# Patient Record
Sex: Female | Born: 1960 | Race: White | Hispanic: No | Marital: Married | Smoking: Former smoker
Health system: Southern US, Community
[De-identification: ages and names within clinical notes are randomized; demographics above are authoritative.]

## PROBLEM LIST (undated history)

## (undated) DIAGNOSIS — K5900 Constipation, unspecified: Secondary | ICD-10-CM

## (undated) DIAGNOSIS — K5790 Diverticulosis of intestine, part unspecified, without perforation or abscess without bleeding: Secondary | ICD-10-CM

## (undated) DIAGNOSIS — T7840XA Allergy, unspecified, initial encounter: Secondary | ICD-10-CM

## (undated) DIAGNOSIS — D3A029 Benign carcinoid tumor of the large intestine, unspecified portion: Secondary | ICD-10-CM

## (undated) DIAGNOSIS — J45909 Unspecified asthma, uncomplicated: Secondary | ICD-10-CM

## (undated) DIAGNOSIS — K5792 Diverticulitis of intestine, part unspecified, without perforation or abscess without bleeding: Secondary | ICD-10-CM

## (undated) DIAGNOSIS — Z5189 Encounter for other specified aftercare: Secondary | ICD-10-CM

## (undated) DIAGNOSIS — B019 Varicella without complication: Secondary | ICD-10-CM

## (undated) DIAGNOSIS — I10 Essential (primary) hypertension: Secondary | ICD-10-CM

## (undated) DIAGNOSIS — E079 Disorder of thyroid, unspecified: Secondary | ICD-10-CM

## (undated) HISTORY — DX: Constipation, unspecified: K59.00

## (undated) HISTORY — PX: ABDOMINAL HYSTERECTOMY: SHX81

## (undated) HISTORY — PX: LAPAROSCOPIC GASTRIC SLEEVE RESECTION: SHX5895

## (undated) HISTORY — DX: Diverticulitis of intestine, part unspecified, without perforation or abscess without bleeding: K57.92

## (undated) HISTORY — DX: Essential (primary) hypertension: I10

## (undated) HISTORY — PX: DG GALL BLADDER: HXRAD326

## (undated) HISTORY — DX: Varicella without complication: B01.9

## (undated) HISTORY — DX: Disorder of thyroid, unspecified: E07.9

## (undated) HISTORY — DX: Encounter for other specified aftercare: Z51.89

## (undated) HISTORY — PX: THYROIDECTOMY: SHX17

## (undated) HISTORY — DX: Unspecified asthma, uncomplicated: J45.909

## (undated) HISTORY — DX: Allergy, unspecified, initial encounter: T78.40XA

## (undated) HISTORY — DX: Diverticulosis of intestine, part unspecified, without perforation or abscess without bleeding: K57.90

## (undated) HISTORY — DX: Benign carcinoid tumor of the large intestine, unspecified portion: D3A.029

---

## 2017-12-07 ENCOUNTER — Ambulatory Visit: Payer: 59 | Admitting: Family Medicine

## 2017-12-07 ENCOUNTER — Encounter: Payer: Self-pay | Admitting: Family Medicine

## 2017-12-07 ENCOUNTER — Encounter (INDEPENDENT_AMBULATORY_CARE_PROVIDER_SITE_OTHER): Payer: Self-pay

## 2017-12-07 VITALS — BP 106/62 | HR 86 | Temp 98.4°F | Ht 65.0 in | Wt 163.4 lb

## 2017-12-07 DIAGNOSIS — E063 Autoimmune thyroiditis: Secondary | ICD-10-CM

## 2017-12-07 DIAGNOSIS — R591 Generalized enlarged lymph nodes: Secondary | ICD-10-CM

## 2017-12-07 DIAGNOSIS — E038 Other specified hypothyroidism: Secondary | ICD-10-CM | POA: Diagnosis not present

## 2017-12-07 MED ORDER — LIOTHYRONINE SODIUM 5 MCG PO TABS: 5.0000 ug | ORAL_TABLET | Freq: Every day | ORAL | 0 refills | Status: DC

## 2017-12-07 MED ORDER — LEVOTHYROXINE SODIUM 125 MCG PO TABS: 125.0000 ug | ORAL_TABLET | Freq: Every day | ORAL | 0 refills | Status: DC

## 2017-12-07 NOTE — Patient Instructions (Signed)
Good to see you today  Please follow up in 2-3 months for your complete physical- we'll order your mammogram at that time  I will notify you of lab results in the next couple of days

## 2017-12-07 NOTE — Progress Notes (Signed)
Subjective:    Patient ID: Debra Adkins, female    DOB: March 06, 1960, 57 y.o.   MRN: [id number]  HPI This is a 57 yo female, accompanied by her 92 yo grand daughter, who presents today to establish care. Married to [employer]. She is currently not working outside the home. Has 3 rescue dogs.   Last CPE- 2-3 years ago Mammo- awhile ago, requests to wait until January due to her high deductible Pap- hysterectomy 2015, unsure if cervix removed. Took ovaries.  Colonoscopy- sees GI in [place], has EGD every 1-2 hours, colonoscopy every 2 years Tdap- unsure Flu- today Eye- regular Dental- not for several years Exercise- walks  Has a lump on left side of back of head x 1 month, seems to come and go. Tender.   History of gastric carcinoid- sees GI in [place], has follow up next month. GI manages her B12 injections.   Requests refill of thyroid medications. Had thyroidectomy at age 63.   Past Medical History:  Diagnosis Date  . Allergy   . Blood transfusion without reported diagnosis   . Chicken pox   . Hypertension   . Thyroid disease    .   Review of Systems Per HPI    Objective:   Physical Exam  Constitutional: She is oriented to person, place, and time. She appears well-developed and well-nourished. No distress.  HENT:  Head: Normocephalic and atraumatic.    Right Ear: External ear normal.  Left Ear: External ear normal.  Nose: Nose normal.  Mouth/Throat: Oropharynx is clear and moist.  Neck: Normal range of motion. Neck supple.  Cardiovascular: Normal rate, regular rhythm and normal heart sounds.  Pulmonary/Chest: Effort normal and breath sounds normal.  Musculoskeletal: She exhibits no edema.  Lymphadenopathy:    She has no cervical adenopathy.  Neurological: She is alert and oriented to person, place, and time.  Skin: Skin is warm and dry. She is not diaphoretic.  Psychiatric: She has a normal mood and affect. Her behavior is normal. Judgment and thought content  normal.  Vitals reviewed.     BP 106/62   Pulse 86   Temp 98.4 F (36.9 C) (Oral)   Ht 5\' 5"  (1.651 m)   Wt 163 lb 6.4 oz (74.1 kg)   SpO2 98%   BMI 27.19 kg/m  Depression screen [facility] 2/9 12/07/2017  Decreased Interest 0  Down, Depressed, Hopeless 0  PHQ - 2 Score 0  Altered sleeping 1  Tired, decreased energy 1  Change in appetite 0  Feeling bad or failure about yourself  0  Trouble concentrating 0  Moving slowly or fidgety/restless 0  Suicidal thoughts 0  PHQ-9 Score 2  Difficult doing work/chores Not difficult at all       Assessment & Plan:  1. Hypothyroidism due to Hashimoto's thyroiditis - she is down to 1 tablet of her meds without refills, will send in 30 day supply and check TSH - TSH - liothyronine (CYTOMEL) 5 MCG tablet; Take 1 tablet (5 mcg total) by mouth daily.  Dispense: 30 tablet; Refill: 0 - levothyroxine (SYNTHROID, LEVOTHROID) 125 MCG tablet; Take 1 tablet (125 mcg total) by mouth daily before breakfast.  Dispense: 30 tablet; Refill: 0  2. Lymphadenopathy - likely reactive, will check CBC - CBC with Differential  - follow up in 2 months for cpe, will address overdue health maintenance at that time - she will have her GI send me records next month  Clarene Reamer, [facility]  [facility]  at [facility], [place]  12/07/2017 5:20 PM

## 2017-12-08 LAB — CBC WITH DIFFERENTIAL/PLATELET
BASOS PCT: 0.8 % (ref 0.0–3.0)
Basophils Absolute: 0.1 10*3/uL (ref 0.0–0.1)
EOS PCT: 4.6 % (ref 0.0–5.0)
Eosinophils Absolute: 0.4 10*3/uL (ref 0.0–0.7)
HCT: 46.2 % — ABNORMAL HIGH (ref 36.0–46.0)
Hemoglobin: 15.3 g/dL — ABNORMAL HIGH (ref 12.0–15.0)
LYMPHS ABS: 2.1 10*3/uL (ref 0.7–4.0)
Lymphocytes Relative: 25.7 % (ref 12.0–46.0)
MCHC: 33.2 g/dL (ref 30.0–36.0)
MCV: 91.1 fl (ref 78.0–100.0)
MONOS PCT: 4.8 % (ref 3.0–12.0)
Monocytes Absolute: 0.4 10*3/uL (ref 0.1–1.0)
Neutro Abs: 5.3 10*3/uL (ref 1.4–7.7)
Neutrophils Relative %: 64.1 % (ref 43.0–77.0)
Platelets: 226 10*3/uL (ref 150.0–400.0)
RBC: 5.07 Mil/uL (ref 3.87–5.11)
RDW: 12.1 % (ref 11.5–15.5)
WBC: 8.3 10*3/uL (ref 4.0–10.5)

## 2017-12-08 LAB — TSH: TSH: 0.12 u[IU]/mL — ABNORMAL LOW (ref 0.35–4.50)

## 2017-12-10 ENCOUNTER — Other Ambulatory Visit: Payer: Self-pay | Admitting: Family Medicine

## 2017-12-10 DIAGNOSIS — E063 Autoimmune thyroiditis: Principal | ICD-10-CM

## 2017-12-10 DIAGNOSIS — E038 Other specified hypothyroidism: Secondary | ICD-10-CM

## 2017-12-28 ENCOUNTER — Encounter: Payer: Self-pay | Admitting: Family Medicine

## 2018-01-07 ENCOUNTER — Other Ambulatory Visit: Payer: Self-pay | Admitting: Family Medicine

## 2018-01-07 DIAGNOSIS — E038 Other specified hypothyroidism: Secondary | ICD-10-CM

## 2018-01-07 DIAGNOSIS — E063 Autoimmune thyroiditis: Principal | ICD-10-CM

## 2018-01-07 NOTE — Telephone Encounter (Signed)
Okay to refill? 

## 2018-02-22 ENCOUNTER — Other Ambulatory Visit: Payer: Self-pay | Admitting: Family Medicine

## 2018-02-22 DIAGNOSIS — E063 Autoimmune thyroiditis: Principal | ICD-10-CM

## 2018-02-22 DIAGNOSIS — E038 Other specified hypothyroidism: Secondary | ICD-10-CM

## 2018-03-24 ENCOUNTER — Ambulatory Visit: Payer: 59 | Admitting: Family Medicine

## 2018-03-24 ENCOUNTER — Encounter: Payer: Self-pay | Admitting: Family Medicine

## 2018-03-24 VITALS — BP 110/68 | HR 76 | Temp 98.4°F | Ht 65.0 in | Wt 153.8 lb

## 2018-03-24 DIAGNOSIS — R062 Wheezing: Secondary | ICD-10-CM

## 2018-03-24 DIAGNOSIS — E063 Autoimmune thyroiditis: Secondary | ICD-10-CM | POA: Diagnosis not present

## 2018-03-24 DIAGNOSIS — E038 Other specified hypothyroidism: Secondary | ICD-10-CM

## 2018-03-24 DIAGNOSIS — Z9889 Other specified postprocedural states: Secondary | ICD-10-CM

## 2018-03-24 DIAGNOSIS — J22 Unspecified acute lower respiratory infection: Secondary | ICD-10-CM | POA: Diagnosis not present

## 2018-03-24 DIAGNOSIS — E538 Deficiency of other specified B group vitamins: Secondary | ICD-10-CM | POA: Diagnosis not present

## 2018-03-24 DIAGNOSIS — R059 Cough, unspecified: Secondary | ICD-10-CM

## 2018-03-24 DIAGNOSIS — R05 Cough: Secondary | ICD-10-CM

## 2018-03-24 LAB — VITAMIN B12: VITAMIN B 12: 252 pg/mL (ref 211–911)

## 2018-03-24 LAB — TSH: TSH: 1.3 u[IU]/mL (ref 0.35–4.50)

## 2018-03-24 MED ORDER — ALBUTEROL SULFATE HFA 108 (90 BASE) MCG/ACT IN AERS: 2.0000 | INHALATION_SPRAY | RESPIRATORY_TRACT | 0 refills | Status: DC | PRN

## 2018-03-24 MED ORDER — IPRATROPIUM-ALBUTEROL 0.5-2.5 (3) MG/3ML IN SOLN
3.0000 mL | Freq: Once | RESPIRATORY_TRACT | Status: AC
  Administered 2018-03-24: 3 mL via RESPIRATORY_TRACT

## 2018-03-24 MED ORDER — AMOXICILLIN 500 MG PO CAPS: 1000.0000 mg | ORAL_CAPSULE | Freq: Three times a day (TID) | ORAL | 0 refills | Status: AC

## 2018-03-24 MED ORDER — BENZONATATE 100 MG PO CAPS: 100.0000 mg | ORAL_CAPSULE | Freq: Three times a day (TID) | ORAL | 0 refills | Status: DC | PRN

## 2018-03-24 MED ORDER — PREDNISONE 10 MG PO TABS: ORAL_TABLET | ORAL | 0 refills | Status: DC

## 2018-03-24 NOTE — Progress Notes (Signed)
Subjective:    Patient ID: Debra Adkins, female    DOB: [birth year], 58 y.o.   MRN: [id number]  HPI This is 58 yo female, accompanied by her mother, who presents today with head and chest congestion x 3 weeks. Dry cough, yellow nasal drainage, no ear pain. Headache. Wheezing. Taking Mucinex. Subjective fever.    Past Medical History:  Diagnosis Date  . Allergy   . Asthma   . Blood transfusion without reported diagnosis   . Carcinoid tumor of colon    Followed by Dr. Karis Juba in [place]  . Chicken pox   . Constipation   . Diverticulosis   . Hypertension   . Thyroid disease    hoshimotos's thyroiditis at age 56   Past Surgical History:  Procedure Laterality Date  . ABDOMINAL HYSTERECTOMY    . DG GALL BLADDER    . LAPAROSCOPIC GASTRIC SLEEVE RESECTION    . THYROIDECTOMY     Family History  Problem Relation Age of Onset  . Heart disease Father   . Hyperlipidemia Father   . Cancer Brother   . Diabetes Brother   . Heart disease Brother   . Heart disease Brother    Social History   Tobacco Use  . Smoking status: Former Research scientist (life sciences)  . Smokeless tobacco: Never Used  Substance Use Topics  . Alcohol use: Yes    Comment: couple times a week  . Drug use: Never      Review of Systems Per HPI    Objective:   Physical Exam Constitutional:      General: She is not in acute distress.    Appearance: Normal appearance. She is normal weight. She is ill-appearing. She is not toxic-appearing or diaphoretic.  HENT:     Head: Normocephalic and atraumatic.     Right Ear: Tympanic membrane, ear canal and external ear normal.     Left Ear: Tympanic membrane, ear canal and external ear normal.     Nose: Nose normal.     Mouth/Throat:     Mouth: Mucous membranes are moist.     Pharynx: Oropharynx is clear.  Eyes:     Conjunctiva/sclera: Conjunctivae normal.  Neck:     Musculoskeletal: Normal range of motion and neck supple. No neck rigidity or muscular tenderness.    Cardiovascular:     Rate and Rhythm: Normal rate and regular rhythm.     Heart sounds: Normal heart sounds.  Pulmonary:     Effort: Pulmonary effort is normal.     Breath sounds: Wheezing and rhonchi present.     Comments: Scattered wheezes throughout, rhonchi left posterior.  Lymphadenopathy:     Cervical: No cervical adenopathy.  Neurological:     Mental Status: She is alert and oriented to person, place, and time.  Psychiatric:        Mood and Affect: Mood normal.        Behavior: Behavior normal.        Thought Content: Thought content normal.        Judgment: Judgment normal.       BP 110/68   Pulse 76   Temp 98.4 F (36.9 C) (Oral)   Ht 5\' 5"  (1.651 m)   Wt 153 lb 12 oz (69.7 kg)   SpO2 97%   BMI 25.59 kg/m  Wt Readings from Last 3 Encounters:  03/24/18 153 lb 12 oz (69.7 kg)  12/07/17 163 lb 6.4 oz (74.1 kg)   Given duoneb in office with subjective  improvement and improved airflow and decreased wheezing on auscultation.     Assessment & Plan:  1. Cough - ipratropium-albuterol (DUONEB) 0.5-2.5 (3) MG/3ML nebulizer solution 3 mL - benzonatate (TESSALON) 100 MG capsule; Take 1-2 capsules (100-200 mg total) by mouth 3 (three) times daily as needed for cough.  Dispense: 30 capsule; Refill: 0 - albuterol (PROVENTIL HFA;VENTOLIN HFA) 108 (90 Base) MCG/ACT inhaler; Inhale 2 puffs into the lungs every 4 (four) hours as needed for wheezing or shortness of breath.  Dispense: 1 Inhaler; Refill: 0 - predniSONE (DELTASONE) 10 MG tablet; Take 6 x 1 days, 5x1, 4x1 3x1, 2x1, 1x1  Dispense: 21 tablet; Refill: 0  2. Wheeze - ipratropium-albuterol (DUONEB) 0.5-2.5 (3) MG/3ML nebulizer solution 3 mL - albuterol (PROVENTIL HFA;VENTOLIN HFA) 108 (90 Base) MCG/ACT inhaler; Inhale 2 puffs into the lungs every 4 (four) hours as needed for wheezing or shortness of breath.  Dispense: 1 Inhaler; Refill: 0 - predniSONE (DELTASONE) 10 MG tablet; Take 6 x 1 days, 5x1, 4x1 3x1, 2x1, 1x1   Dispense: 21 tablet; Refill: 0  3. Hypothyroidism due to Hashimoto's thyroiditis - TSH - levothyroxine (SYNTHROID, LEVOTHROID) 100 MCG tablet; Take 1 tablet (100 mcg total) by mouth daily before breakfast.  Dispense: 90 tablet; Refill: 3  4. Lower respiratory infection - Provided written and verbal information regarding diagnosis and treatment. - RTC/ER precautions reviewed - amoxicillin (AMOXIL) 500 MG capsule; Take 2 capsules (1,000 mg total) by mouth 3 (three) times daily for 7 days.  Dispense: 42 capsule; Refill: 0 - predniSONE (DELTASONE) 10 MG tablet; Take 6 x 1 days, 5x1, 4x1 3x1, 2x1, 1x1  Dispense: 21 tablet; Refill: 0  5. B12 deficiency - Vitamin B12 - cyanocobalamin (,VITAMIN B-12,) 1000 MCG/ML injection; Inject 1 mL (1,000 mcg total) into the skin every 30 (thirty) days.  Dispense: 1 mL; Refill: 11  6. History of gastric surgery - cyanocobalamin (,VITAMIN B-12,) 1000 MCG/ML injection; Inject 1 mL (1,000 mcg total) into the skin every 30 (thirty) days.  Dispense: 1 mL; Refill: [place], FNP-BC  [facility] at [facility], [facility]  03/28/2018 6:36 PM l

## 2018-03-24 NOTE — Progress Notes (Signed)
Subjective:    Patient ID: Debra Adkins, female    DOB: 09-03-1960, 58 y.o.   MRN: [id number]  HPI This is a 58 yo female being seen today cough x3 weeks. Symptoms got better at first then worsened again. She has a non-productive cough with SOB and wheezes; runny nose with yellow drainage; headaches, denies ear pian, irregular heart beats; no energy, appetite and fluid intake is good.    Patient has taken 1000 mg guaifenesin "a few times" without symptom relief.   Past Medical History:  Diagnosis Date  . Allergy   . Asthma   . Blood transfusion without reported diagnosis   . Carcinoid tumor of colon    Followed by Dr. Karis Juba in [place]  . Chicken pox   . Constipation   . Diverticulosis   . Hypertension   . Thyroid disease    hoshimotos's thyroiditis at age 45   Past Surgical History:  Procedure Laterality Date  . ABDOMINAL HYSTERECTOMY    . DG GALL BLADDER    . LAPAROSCOPIC GASTRIC SLEEVE RESECTION    . THYROIDECTOMY     Family History  Problem Relation Age of Onset  . Heart disease Father   . Hyperlipidemia Father   . Cancer Brother   . Diabetes Brother   . Heart disease Brother   . Heart disease Brother    Social History   Tobacco Use  . Smoking status: Former Research scientist (life sciences)  . Smokeless tobacco: Never Used  Substance Use Topics  . Alcohol use: Yes    Comment: couple times a week  . Drug use: Never      Review of Systems Per HPI      Objective:   Physical Exam   BP 110/68   Pulse 76   Temp 98.4 F (36.9 C) (Oral)   Ht 5\' 5"  (1.651 m)   Wt 153 lb 12 oz (69.7 kg)   SpO2 97%   BMI 25.59 kg/m   BP Readings from Last 3 Encounters:  03/24/18 110/68  12/07/17 106/62    Wt Readings from Last 3 Encounters:  03/24/18 153 lb 12 oz (69.7 kg)  12/07/17 163 lb 6.4 oz (74.1 kg)           Assessment & Plan:  Cough - Plan: ipratropium-albuterol (DUONEB) 0.5-2.5 (3) MG/3ML nebulizer solution 3 mL, benzonatate (TESSALON) 100 MG capsule,  albuterol (PROVENTIL HFA;VENTOLIN HFA) 108 (90 Base) MCG/ACT inhaler  Wheeze - Plan: ipratropium-albuterol (DUONEB) 0.5-2.5 (3) MG/3ML nebulizer solution 3 mL, albuterol (PROVENTIL HFA;VENTOLIN HFA) 108 (90 Base) MCG/ACT inhaler  Hypothyroidism due to Hashimoto's thyroiditis - Plan: TSH  Lower respiratory infection - Plan: amoxicillin (AMOXIL) 500 MG capsule   Patient Instructions  I have sent an antibiotic, inhaler and cough medicine to your pharmacy Follow up if not significantly improved in 5-7 days Drink enough liquids to make your urine light yellow Tylenol or Advil for fever/pain   Community-Acquired Pneumonia, Adult Pneumonia is an infection of the lungs. It causes swelling in the airways of the lungs. Mucus and fluid may also build up inside the airways. One type of pneumonia can happen while a person is in a hospital. A different type can happen when a person is not in a hospital (community-acquired pneumonia).  What are the causes?  This condition is caused by germs (viruses, bacteria, or fungi). Some types of germs can be passed from one person to another. This can happen when you breathe in droplets from the cough or sneeze of  an infected person. What increases the risk? You are more likely to develop this condition if you:  Have a long-term (chronic) disease, such as: ? Chronic obstructive pulmonary disease (COPD). ? Asthma. ? Cystic fibrosis. ? Congestive heart failure. ? Diabetes. ? Kidney disease.  Have HIV.  Have sickle cell disease.  Have had your spleen removed.  Do not take good care of your teeth and mouth (poor dental hygiene).  Have a medical condition that increases the risk of breathing in droplets from your own mouth and nose.  Have a weakened body defense system (immune system).  Are a smoker.  Travel to areas where the germs that cause this illness are common.  Are around certain animals or the places they live. What are the signs or  symptoms?  A dry cough.  A wet (productive) cough.  Fever.  Sweating.  Chest pain. This often happens when breathing deeply or coughing.  Fast breathing or trouble breathing.  Shortness of breath.  Shaking chills.  Feeling tired (fatigue).  Muscle aches. How is this treated? Treatment for this condition depends on many things. Most adults can be treated at home. In some cases, treatment must happen in a hospital. Treatment may include:  Medicines given by mouth or through an IV tube.  Being given extra oxygen.  Respiratory therapy. In rare cases, treatment for very bad pneumonia may include:  Using a machine to help you breathe.  Having a procedure to remove fluid from around your lungs. Follow these instructions at home: Medicines  Take over-the-counter and prescription medicines only as told by your doctor. ? Only take cough medicine if you are losing sleep.  If you were prescribed an antibiotic medicine, take it as told by your doctor. Do not stop taking the antibiotic even if you start to feel better. General instructions   Sleep with your head and neck raised (elevated). You can do this by sleeping in a recliner or by putting a few pillows under your head.  Rest as needed. Get at least 8 hours of sleep each night.  Drink enough water to keep your pee (urine) pale yellow.  Eat a healthy diet that includes plenty of vegetables, fruits, whole grains, low-fat dairy products, and lean protein.  Do not use any products that contain nicotine or tobacco. These include cigarettes, e-cigarettes, and chewing tobacco. If you need help quitting, ask your doctor.  Keep all follow-up visits as told by your doctor. This is important. How is this prevented? A shot (vaccine) can help prevent pneumonia. Shots are often suggested for:  People older than 58 years of age.  People older than 57 years of age who: ? Are having cancer treatment. ? Have long-term (chronic)  lung disease. ? Have problems with their body's defense system. You may also prevent pneumonia if you take these actions:  Get the flu (influenza) shot every year.  Go to the dentist as often as told.  Wash your hands often. If you cannot use soap and water, use hand sanitizer. Contact a doctor if:  You have a fever.  You lose sleep because your cough medicine does not help. Get help right away if:  You are short of breath and it gets worse.  You have more chest pain.  Your sickness gets worse. This is very serious if: ? You are an older adult. ? Your body's defense system is weak.  You cough up blood. Summary  Pneumonia is an infection of the lungs.  Most  adults can be treated at home. Some will need treatment in a hospital.  Drink enough water to keep your pee pale yellow.  Get at least 8 hours of sleep each night. This information is not intended to replace advice given to you by your health care provider. Make sure you discuss any questions you have with your health care provider. Document Released: 06/25/2007 Document Revised: 09/03/2017 Document Reviewed: 09/03/2017 Elsevier Interactive Patient Education  2019 [employer].

## 2018-03-24 NOTE — Patient Instructions (Signed)
I have sent an antibiotic, inhaler and cough medicine to your pharmacy Follow up if not significantly improved in 5-7 days Drink enough liquids to make your urine light yellow Tylenol or Advil for fever/pain   Community-Acquired Pneumonia, Adult Pneumonia is an infection of the lungs. It causes swelling in the airways of the lungs. Mucus and fluid may also build up inside the airways. One type of pneumonia can happen while a person is in a hospital. A different type can happen when a person is not in a hospital (community-acquired pneumonia).  What are the causes?  This condition is caused by germs (viruses, bacteria, or fungi). Some types of germs can be passed from one person to another. This can happen when you breathe in droplets from the cough or sneeze of an infected person. What increases the risk? You are more likely to develop this condition if you:  Have a long-term (chronic) disease, such as: ? Chronic obstructive pulmonary disease (COPD). ? Asthma. ? Cystic fibrosis. ? Congestive heart failure. ? Diabetes. ? Kidney disease.  Have HIV.  Have sickle cell disease.  Have had your spleen removed.  Do not take good care of your teeth and mouth (poor dental hygiene).  Have a medical condition that increases the risk of breathing in droplets from your own mouth and nose.  Have a weakened body defense system (immune system).  Are a smoker.  Travel to areas where the germs that cause this illness are common.  Are around certain animals or the places they live. What are the signs or symptoms?  A dry cough.  A wet (productive) cough.  Fever.  Sweating.  Chest pain. This often happens when breathing deeply or coughing.  Fast breathing or trouble breathing.  Shortness of breath.  Shaking chills.  Feeling tired (fatigue).  Muscle aches. How is this treated? Treatment for this condition depends on many things. Most adults can be treated at home. In some  cases, treatment must happen in a hospital. Treatment may include:  Medicines given by mouth or through an IV tube.  Being given extra oxygen.  Respiratory therapy. In rare cases, treatment for very bad pneumonia may include:  Using a machine to help you breathe.  Having a procedure to remove fluid from around your lungs. Follow these instructions at home: Medicines  Take over-the-counter and prescription medicines only as told by your doctor. ? Only take cough medicine if you are losing sleep.  If you were prescribed an antibiotic medicine, take it as told by your doctor. Do not stop taking the antibiotic even if you start to feel better. General instructions   Sleep with your head and neck raised (elevated). You can do this by sleeping in a recliner or by putting a few pillows under your head.  Rest as needed. Get at least 8 hours of sleep each night.  Drink enough water to keep your pee (urine) pale yellow.  Eat a healthy diet that includes plenty of vegetables, fruits, whole grains, low-fat dairy products, and lean protein.  Do not use any products that contain nicotine or tobacco. These include cigarettes, e-cigarettes, and chewing tobacco. If you need help quitting, ask your doctor.  Keep all follow-up visits as told by your doctor. This is important. How is this prevented? A shot (vaccine) can help prevent pneumonia. Shots are often suggested for:  People older than 58 years of age.  People older than 58 years of age who: ? Are having cancer treatment. ?  Have long-term (chronic) lung disease. ? Have problems with their body's defense system. You may also prevent pneumonia if you take these actions:  Get the flu (influenza) shot every year.  Go to the dentist as often as told.  Wash your hands often. If you cannot use soap and water, use hand sanitizer. Contact a doctor if:  You have a fever.  You lose sleep because your cough medicine does not help. Get  help right away if:  You are short of breath and it gets worse.  You have more chest pain.  Your sickness gets worse. This is very serious if: ? You are an older adult. ? Your body's defense system is weak.  You cough up blood. Summary  Pneumonia is an infection of the lungs.  Most adults can be treated at home. Some will need treatment in a hospital.  Drink enough water to keep your pee pale yellow.  Get at least 8 hours of sleep each night. This information is not intended to replace advice given to you by your health care provider. Make sure you discuss any questions you have with your health care provider. Document Released: 06/25/2007 Document Revised: 09/03/2017 Document Reviewed: 09/03/2017 Elsevier Interactive Patient Education  2019 [employer].

## 2018-03-26 MED ORDER — CYANOCOBALAMIN 1000 MCG/ML IJ SOLN: 1000.0000 ug | INTRAMUSCULAR | 11 refills | Status: DC

## 2018-03-26 MED ORDER — LEVOTHYROXINE SODIUM 100 MCG PO TABS: 100.0000 ug | ORAL_TABLET | Freq: Every day | ORAL | 3 refills | Status: DC

## 2018-03-28 ENCOUNTER — Encounter: Payer: Self-pay | Admitting: Family Medicine

## 2018-03-29 ENCOUNTER — Encounter: Payer: Self-pay | Admitting: Family Medicine

## 2018-04-12 ENCOUNTER — Other Ambulatory Visit: Payer: Self-pay | Admitting: Family Medicine

## 2018-04-12 DIAGNOSIS — J4540 Moderate persistent asthma, uncomplicated: Secondary | ICD-10-CM

## 2018-04-12 MED ORDER — FLUTICASONE-SALMETEROL 100-50 MCG/DOSE IN AEPB: 1.0000 | INHALATION_SPRAY | Freq: Two times a day (BID) | RESPIRATORY_TRACT | 3 refills | Status: DC

## 2018-04-12 NOTE — Progress Notes (Signed)
Spoke with patient while on call with her mother. Patient reports some increased sneezing, nasal congestion, dry cough. Felt well immediately after finishing prednisone, feels like lungs feel full. No fever. She is using albuterol 3-4 times a day. Was previously on ICS years ago. Discussed adding daily antihistamine and daily maintenance inhaler. Patient agreeable. Prescription for advair sent to patient's pharmacy.

## 2018-04-16 ENCOUNTER — Other Ambulatory Visit: Payer: Self-pay

## 2018-04-16 DIAGNOSIS — J4540 Moderate persistent asthma, uncomplicated: Secondary | ICD-10-CM

## 2018-04-16 MED ORDER — FLUTICASONE-SALMETEROL 100-50 MCG/DOSE IN AEPB: 1.0000 | INHALATION_SPRAY | Freq: Two times a day (BID) | RESPIRATORY_TRACT | 3 refills | Status: DC

## 2018-05-12 ENCOUNTER — Ambulatory Visit (INDEPENDENT_AMBULATORY_CARE_PROVIDER_SITE_OTHER): Payer: 59 | Admitting: Family Medicine

## 2018-05-12 ENCOUNTER — Encounter: Payer: Self-pay | Admitting: Family Medicine

## 2018-05-12 VITALS — Temp 96.7°F | Ht 65.0 in | Wt 168.0 lb

## 2018-05-12 DIAGNOSIS — R05 Cough: Secondary | ICD-10-CM

## 2018-05-12 DIAGNOSIS — R059 Cough, unspecified: Secondary | ICD-10-CM

## 2018-05-12 DIAGNOSIS — R5383 Other fatigue: Secondary | ICD-10-CM | POA: Diagnosis not present

## 2018-05-12 DIAGNOSIS — R062 Wheezing: Secondary | ICD-10-CM | POA: Diagnosis not present

## 2018-05-12 NOTE — Progress Notes (Signed)
Virtual Visit via Video Note  I connected with Debra Adkins on 05/12/18 at  8:41 AM EDT by a video enabled telemedicine application and verified that I am speaking with the correct person using two identifiers.  Attempted to connect with patient via video, patient was having technical difficulties and was unable to connect so we did visit with audio only.  The patient was at her home and I was in my office.   I discussed the limitations of evaluation and management by telemedicine and the availability of in person appointments. The patient expressed understanding and agreed to proceed.  History of Present Illness: Debra Adkins is a 58 year old female who requests virtual visit today for cough and shortness of breath.  She was seen last month and diagnosed with lower respiratory infection.  She was treated with antibiotics and prednisone with improvement but she reports that she was not at 100%.  She was also started on Advair 100-50 twice daily for persistent wheezing.  She reports that she was not able to get this filled until April 15 and therefore just started it about a week ago.  I was unaware of this.  She has had some mild nausea after using the Advair.  Over the last several days she has had increased shortness of breath, wheeze and fatigue.  She has had temperature up to 100.  She has had a cough with a small amount of white sputum.  She is also been having postnasal drainage and runny nose, clear drainage.  No sneezing or watery eyes.  No one else at home is sick.  She is using her albuterol inhaler every 4-6 hours with temporary improvement of symptoms.  She is taking Claritin daily and Benadryl at bedtime.  She is sleeping okay.   Observations/Objective: The patient is alert and answers questions appropriately.  She is normally conversive.  She did have occasional cough while talking on the phone.  She did not sound short of breath nor did I hear any wheezing.  Assessment and Plan: 1.  Cough -Possible viral illness with low-grade fever.  Cannot exclude coronavirus.  Discussed this with her.  Advised her to isolate at home. -Continue Advair (suggested she use prior to eating), albuterol, Mucinex -Continue close follow-up with her via telephone  2. Wheeze - see #1  3. Other fatigue -When respiratory symptoms have improved, will check labs if fatigue is persistent   Clarene Reamer, FNP-BC  [facility] at [facility], [place]  05/12/2018 9:28 AM   Follow Up Instructions:    I discussed the assessment and treatment plan with the patient. The patient was provided an opportunity to ask questions and all were answered. The patient agreed with the plan and demonstrated an understanding of the instructions.   The patient was advised to call back or seek an in-person evaluation if the symptoms worsen or if the condition fails to improve as anticipated.  I provided 18 minutes of non-face-to-face time during this encounter.   Elby Beck, FNP

## 2018-05-14 ENCOUNTER — Other Ambulatory Visit: Payer: Self-pay | Admitting: Family Medicine

## 2018-05-14 ENCOUNTER — Telehealth: Payer: Self-pay | Admitting: Family Medicine

## 2018-05-14 DIAGNOSIS — R059 Cough, unspecified: Secondary | ICD-10-CM

## 2018-05-14 DIAGNOSIS — R05 Cough: Secondary | ICD-10-CM

## 2018-05-14 MED ORDER — BENZONATATE 100 MG PO CAPS: 100.0000 mg | ORAL_CAPSULE | Freq: Three times a day (TID) | ORAL | 0 refills | Status: DC | PRN

## 2018-05-14 NOTE — Telephone Encounter (Signed)
Called and spoke with patient to follow up on respiratory symptoms. She continues to run intermittent fever, highest 100.7 this am. Feels a little better. Out of tessalon which seemed to help, will send a prescription to her pharmacy. Continues Mucinex, albuterol inhaler. Able to talk in complete sentences. Will continue regular follow up while symptoms persist.

## 2018-05-17 ENCOUNTER — Encounter: Payer: Self-pay | Admitting: Family Medicine

## 2018-05-17 ENCOUNTER — Telehealth: Payer: Self-pay | Admitting: Family Medicine

## 2018-05-17 NOTE — Telephone Encounter (Signed)
Attempted to call patient again.  There was no answer and her voice mailbox was full.  MyChart message sent to her.

## 2018-05-17 NOTE — Telephone Encounter (Signed)
Called to check on patient. No answer. Unable to leave voice mail, box was full. Will try again later.

## 2018-05-19 NOTE — Telephone Encounter (Signed)
Patient replied to mychart message 05/18/18. Attempted to call her today to check on her. No answer, voice mailbox full. Will try again at later time.

## 2018-05-24 NOTE — Telephone Encounter (Signed)
Attempted to call patient to check on her symptoms. No answer. Mailbox full, unable to leave message.

## 2018-06-05 ENCOUNTER — Other Ambulatory Visit: Payer: Self-pay | Admitting: Family Medicine

## 2018-06-05 DIAGNOSIS — R062 Wheezing: Secondary | ICD-10-CM

## 2018-06-05 DIAGNOSIS — R059 Cough, unspecified: Secondary | ICD-10-CM

## 2018-06-05 DIAGNOSIS — R05 Cough: Secondary | ICD-10-CM

## 2018-07-14 ENCOUNTER — Ambulatory Visit: Payer: 59 | Admitting: Family Medicine

## 2018-07-14 ENCOUNTER — Other Ambulatory Visit: Payer: Self-pay | Admitting: Family Medicine

## 2018-07-14 ENCOUNTER — Other Ambulatory Visit: Payer: Self-pay

## 2018-07-14 ENCOUNTER — Encounter: Payer: Self-pay | Admitting: Family Medicine

## 2018-07-14 ENCOUNTER — Ambulatory Visit (INDEPENDENT_AMBULATORY_CARE_PROVIDER_SITE_OTHER)
Admission: RE | Admit: 2018-07-14 | Discharge: 2018-07-14 | Disposition: A | Discharge status: Home / Self Care | Payer: 59 | Source: Ambulatory Visit | Attending: Family Medicine | Admitting: Family Medicine

## 2018-07-14 VITALS — BP 124/86 | HR 76 | Temp 96.4°F | Wt 163.5 lb

## 2018-07-14 DIAGNOSIS — E039 Hypothyroidism, unspecified: Secondary | ICD-10-CM | POA: Diagnosis not present

## 2018-07-14 DIAGNOSIS — Z9889 Other specified postprocedural states: Secondary | ICD-10-CM

## 2018-07-14 DIAGNOSIS — E538 Deficiency of other specified B group vitamins: Secondary | ICD-10-CM

## 2018-07-14 DIAGNOSIS — R0602 Shortness of breath: Secondary | ICD-10-CM

## 2018-07-14 DIAGNOSIS — F419 Anxiety disorder, unspecified: Secondary | ICD-10-CM

## 2018-07-14 DIAGNOSIS — J4540 Moderate persistent asthma, uncomplicated: Secondary | ICD-10-CM | POA: Diagnosis not present

## 2018-07-14 LAB — VITAMIN D 25 HYDROXY (VIT D DEFICIENCY, FRACTURES): VITD: 23.67 ng/mL — ABNORMAL LOW (ref 30.00–100.00)

## 2018-07-14 LAB — TSH: TSH: 40.5 u[IU]/mL — ABNORMAL HIGH (ref 0.35–4.50)

## 2018-07-14 LAB — T4, FREE: Free T4: 0.56 ng/dL — ABNORMAL LOW (ref 0.60–1.60)

## 2018-07-14 MED ORDER — CYANOCOBALAMIN 1000 MCG/ML IJ SOLN: 1000.0000 ug | INTRAMUSCULAR | 3 refills | Status: DC

## 2018-07-14 MED ORDER — SERTRALINE HCL 50 MG PO TABS: 50.0000 mg | ORAL_TABLET | Freq: Every day | ORAL | 3 refills | Status: DC

## 2018-07-14 MED ORDER — QVAR REDIHALER 80 MCG/ACT IN AERB: 1.0000 | INHALATION_SPRAY | Freq: Two times a day (BID) | RESPIRATORY_TRACT | 5 refills | Status: DC

## 2018-07-14 MED ORDER — "SYRINGE/NEEDLE (DISP) 25G X 1"" 1 ML MISC": 1 refills | Status: DC

## 2018-07-14 NOTE — Progress Notes (Signed)
Subjective:    Patient ID: Debra Adkins, female    DOB: 1960-01-22, 58 y.o.   MRN: [id number]  HPI This is a 58 yo female, accompanied by her mother who also has an appointment, who presents today to discuss medications.   Athma- continues to feel "cloudy" in breathing, occasional wheeze, no cough. Comes and goes. Unknown triggers. Using albuterol inhaler 1-2 times a day. Used one canister of Advair 100-50 but did not think it helped. Albuterol helps temporarily. Previous course of prednisone with good relief.   Anxiety- has been feeling increasingly overwhelmed. Was home schooling her 66 yo grand daughter. Husband has been around more. Having a hard time relaxing.   Hypothyroid- was previously on liothyronine and levothyroxine. Stopped liothyronine, not sure why. Has been feeling more fatigued lately.   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 Last B12 03/24/2018 was 252 on monthly injections. Has follow up with GI in [place].   Past Medical History:  Diagnosis Date  . Allergy   . Asthma   . Blood transfusion without reported diagnosis   . Carcinoid tumor of colon    Followed by Dr. Karis Juba in [place]  . Chicken pox   . Constipation   . Diverticulosis   . Hypertension   . Thyroid disease    hoshimotos's thyroiditis at age 17   Past Surgical History:  Procedure Laterality Date  . ABDOMINAL HYSTERECTOMY    . DG GALL BLADDER    . LAPAROSCOPIC GASTRIC SLEEVE RESECTION    . THYROIDECTOMY     Family History  Problem Relation Age of Onset  . Heart disease Father   . Hyperlipidemia Father   . Cancer Brother   . Diabetes Brother   . Heart disease Brother   . Heart disease Brother    Social History   Tobacco Use  . Smoking status: Former Research scientist (life sciences)  . Smokeless tobacco: Never Used  Substance Use Topics  . Alcohol use: Yes    Comment: couple times a week  . Drug use: Never      Review of Systems Per HPI    Objective:   Physical Exam Vitals signs reviewed.  Constitutional:      General: She is not in acute distress.    Appearance: Normal appearance. She is normal weight. She is not ill-appearing, toxic-appearing or diaphoretic.  HENT:     Head: Normocephalic and atraumatic.  Eyes:     Conjunctiva/sclera: Conjunctivae normal.  Neck:     Musculoskeletal: Normal range of motion and neck supple.  Cardiovascular:     Rate and Rhythm: Normal rate and regular rhythm.     Heart sounds: Normal heart sounds.  Pulmonary:     Effort: Pulmonary effort is normal. No respiratory distress.     Breath sounds: Normal breath sounds. No stridor. No wheezing, rhonchi or rales.  Skin:    General: Skin is warm and dry.  Neurological:     Mental Status: She is alert and oriented to person, place, and time.  Psychiatric:        Mood and Affect: Mood normal.        Behavior: Behavior normal.        Thought Content: Thought content normal.        Judgment: Judgment normal.       BP 124/86 (BP Location: Left Arm, Patient Position: Sitting, Cuff Size: Normal)   Pulse 76  Temp (!) 96.4 F (35.8 C) (Tympanic)   Wt 163 lb 8 oz (74.2 kg)   SpO2 97%   BMI 27.21 kg/m  Wt Readings from Last 3 Encounters:  07/14/18 163 lb 8 oz (74.2 kg)  05/12/18 168 lb (76.2 kg)  03/24/18 153 lb 12 oz (69.7 kg)       Assessment & Plan:  1. B12 deficiency - cyanocobalamin (,VITAMIN B-12,) 1000 MCG/ML injection; Inject 1 mL (1,000 mcg total) into the skin every 7 (seven) days.  Dispense: 1 mL; Refill: 3  2. History of gastric surgery - Vitamin D, 25-hydroxy - cyanocobalamin (,VITAMIN B-12,) 1000 MCG/ML injection; Inject 1 mL (1,000 mcg total) into the skin every 7 (seven) days.  Dispense: 1 mL; Refill: 3  3. Acquired hypothyroidism - TSH - T4, Free - T3; Future  4. Moderate persistent asthma without complication - unable to do PFTs due to Covid 19 restrictions in office, will  check chest xray an discussed trying different ICS, patient agreeable - DG Chest 2 View; Future - beclomethasone (QVAR REDIHALER) 80 MCG/ACT inhaler; Inhale 1 puff into the lungs 2 (two) times daily.  Dispense: 10.6 g; Refill: 5  5. SOB (shortness of breath) - DG Chest 2 View; Future  6. Anxiety - encouraged her to continue walking, try meditation, yoga - sertraline (ZOLOFT) 50 MG tablet; Take 1 tablet (50 mg total) by mouth daily.  Dispense: 90 tablet; Refill: 3  - follow up in 3 months, sooner if worsening symptoms Clarene Reamer, FNP-BC  [facility] [place]  07/14/2018 12:41 PM

## 2018-07-14 NOTE — Addendum Note (Signed)
Addended by: Clarene Reamer B on: 07/14/2018 02:16 PM   Modules accepted: Orders

## 2018-07-14 NOTE — Patient Instructions (Signed)
Good to see you today  Hang in there. I sent in a prescription for sertraline to help level out your mood. Take 1/2 tablet at bedtime for 1 week then increase to 1 tablet. If any problems or if no improvement in 4 weeks, please get in touch.   I will notify you of lab/chest xray results  Follow up in 3 months, sooner if needed or if breathing worsens.

## 2018-07-14 NOTE — Telephone Encounter (Signed)
Called Yaw back and clarified the B12 Rx with him, also he requested [place] to send in the Rx for the syringe/needle so pt's insurance will pay for it too, done and Yaw aware

## 2018-07-14 NOTE — Telephone Encounter (Signed)
Best number [phone] hit 0 Ask for YAW @ [place] to verify quantity on rx from [facility]

## 2018-07-14 NOTE — Addendum Note (Signed)
Addended by: Helene Shoe on: 07/14/2018 04:05 PM   Modules accepted: Orders

## 2018-07-14 NOTE — Telephone Encounter (Addendum)
Debra Adkins from [facility] left v/m that pt wants 3 month supply of Vit B 12.Please advise.

## 2018-07-16 ENCOUNTER — Other Ambulatory Visit: Payer: Self-pay | Admitting: Family Medicine

## 2018-07-16 DIAGNOSIS — E038 Other specified hypothyroidism: Secondary | ICD-10-CM

## 2018-07-16 MED ORDER — LIOTHYRONINE SODIUM 5 MCG PO TABS: 5.0000 ug | ORAL_TABLET | Freq: Every day | ORAL | 3 refills | Status: DC

## 2018-07-16 NOTE — Addendum Note (Signed)
Addended by: Clarene Reamer B on: 07/16/2018 02:10 PM   Modules accepted: Orders

## 2018-11-12 ENCOUNTER — Other Ambulatory Visit: Payer: Self-pay | Admitting: Family Medicine

## 2018-11-12 DIAGNOSIS — R05 Cough: Secondary | ICD-10-CM

## 2018-11-12 DIAGNOSIS — R059 Cough, unspecified: Secondary | ICD-10-CM

## 2018-11-12 DIAGNOSIS — R062 Wheezing: Secondary | ICD-10-CM

## 2019-02-08 ENCOUNTER — Telehealth: Payer: Self-pay

## 2019-02-08 NOTE — Telephone Encounter (Signed)
Fax came from [facility] stating that they no longer carry [employer] brand and need an ok to change to [employer] brand. Thank you

## 2019-02-08 NOTE — Telephone Encounter (Signed)
Please call pharmacy and ok change of brand of levothyroxine.

## 2019-02-09 NOTE — Telephone Encounter (Signed)
Called pharmacy and gave verbal ok to switch brands

## 2019-03-17 ENCOUNTER — Telehealth: Payer: Self-pay

## 2019-03-17 ENCOUNTER — Telehealth: Payer: Self-pay | Admitting: Family Medicine

## 2019-03-17 ENCOUNTER — Encounter: Payer: Self-pay | Admitting: Family Medicine

## 2019-03-17 ENCOUNTER — Ambulatory Visit (INDEPENDENT_AMBULATORY_CARE_PROVIDER_SITE_OTHER): Payer: Managed Care, Other (non HMO) | Admitting: Family Medicine

## 2019-03-17 DIAGNOSIS — U071 COVID-19: Secondary | ICD-10-CM | POA: Insufficient documentation

## 2019-03-17 MED ORDER — PROMETHAZINE-DM 6.25-15 MG/5ML PO SYRP: 5.0000 mL | ORAL_SOLUTION | Freq: Four times a day (QID) | ORAL | 0 refills | Status: DC | PRN

## 2019-03-17 MED ORDER — AZITHROMYCIN 250 MG PO TABS: ORAL_TABLET | ORAL | 0 refills | Status: DC

## 2019-03-17 MED ORDER — PREDNISONE 10 MG PO TABS: ORAL_TABLET | ORAL | 0 refills | Status: DC

## 2019-03-17 NOTE — Progress Notes (Signed)
Virtual Visit via Video Note  I connected with Debra Adkins on 03/17/19 at 12:15 PM EST by a video enabled telemedicine application and verified that I am speaking with the correct person using two identifiers.  Location: Patient: home Provider:office   I discussed the limitations of evaluation and management by telemedicine and the availability of in person appointments. The patient expressed understanding and agreed to proceed.  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)   Mother died of covid just before xmas  Initially tested negative   She was diagnosed with covid 68 on 02/14/19  (she has been quarantined in [place])  Her husband re tested neg recently   She had body aches/ headache/ loss of smell an taste  Mild sob   Still struggling with symptoms overall  Yesterday started coughing - now green phlegm  Some wheezing- ventolin inhaler helps  Drinking water and taking mucinex  She is prone to acute bronchitis   She cannot sleep well due to cough and discomfort    Has had pneumonia before   Generally needs prednisone for wheezing  Has used tessalon in the past   Patient Active Problem List   Diagnosis Date Noted  . COVID-19 03/17/2019   Past Medical History:  Diagnosis Date  . Allergy   . Asthma   . Blood transfusion without reported diagnosis   . Carcinoid tumor of colon    Followed by Dr. Karis Juba in [place]  . Chicken pox   . Constipation   . Diverticulosis   . Hypertension   . Thyroid disease    hoshimotos's thyroiditis at age 28   Past Surgical History:  Procedure Laterality Date  . ABDOMINAL HYSTERECTOMY    . DG GALL BLADDER    . LAPAROSCOPIC GASTRIC SLEEVE RESECTION    . THYROIDECTOMY     Social History    Tobacco Use  . Smoking status: Former Research scientist (life sciences)  . Smokeless tobacco: Never Used  Substance Use Topics  . Alcohol use: Yes    Comment: couple times a week  . Drug use: Never   Family History  Problem Relation Age of Onset  . Heart disease Father   . Hyperlipidemia Father   . Cancer Brother   . Diabetes Brother   . Heart disease Brother   . Heart disease Brother    Allergies  Allergen Reactions  . Codeine Rash    nausea   Current Outpatient Medications on File Prior to Visit  Medication Sig Dispense Refill  . albuterol (VENTOLIN HFA) 108 (90 Base) MCG/ACT inhaler INHALE 2 PUFFS BY MOUTH EVERY 4 HOURS AS NEEDED FOR WHEEZING FOR SHORTNESS OF BREATH 18 g 2  . beclomethasone (QVAR REDIHALER) 80 MCG/ACT inhaler Inhale 1 puff into the lungs 2 (two) times daily. 10.6 g 5  . benzonatate (TESSALON) 100 MG capsule Take 1-2 capsules (100-200 mg total) by mouth 3 (three) times daily as needed. 30 capsule 0  . cyanocobalamin (,VITAMIN B-12,) 1000 MCG/ML injection Inject 1 mL (1,000 mcg total) into the skin every 7 (seven) days. 1 mL 3  . levothyroxine (SYNTHROID, LEVOTHROID) 100 MCG tablet Take 1 tablet (100 mcg total) by mouth daily before breakfast. 90 tablet 3  . liothyronine (CYTOMEL) 5 MCG  tablet Take 1 tablet (5 mcg total) by mouth daily. 90 tablet 3  . sertraline (ZOLOFT) 50 MG tablet Take 1 tablet (50 mg total) by mouth daily. 90 tablet 3  . Syringe/Needle, Disp, 25G X 1" 1 ML MISC Use one syringe to administer B12 injection 25 each 1  . VITAMIN D PO Take by mouth.     No current facility-administered medications on file prior to visit.   Review of Systems  Constitutional: Positive for malaise/fatigue. Negative for chills and fever.  HENT: Negative for congestion, ear pain, sinus pain and sore throat.   Eyes: Negative for blurred vision, discharge and redness.  Respiratory: Positive for cough, sputum production and wheezing. Negative for shortness of breath and stridor.    Cardiovascular: Negative for chest pain, palpitations and leg swelling.  Gastrointestinal: Negative for abdominal pain, diarrhea, nausea and vomiting.  Musculoskeletal: Negative for myalgias.  Skin: Negative for rash.  Neurological: Positive for headaches. Negative for dizziness.      Observations/Objective: Patient appears well, in no distress Weight is baseline  No facial swelling or asymmetry Normal voice-not hoarse and no slurred speech No obvious tremor or mobility impairment Moving neck and UEs normally Able to hear the call well  No sob or wheezing heard, a few coughs noted during the interview Talkative and mentally sharp with no cognitive changes No skin changes on face or neck , no rash or pallor Affect is normal    Assessment and Plan: Problem List Items Addressed This Visit      Other   COVID-19    Diagnosed 1/25 after close exposure  Symptoms got better and now worse (resp) Developing prod cough and wheeze (worrisome for secondary bronchitis or early pneumonia) in pt with h/o reactive airways  Prednisone taper and zpak sent  Also prometh-DM for cough  otc expectorant adv Fluids/rest/isolate ER if suddenly worse  Update if not starting to improve in a week or if worsening  -or if any new symptoms       Relevant Medications   azithromycin (ZITHROMAX Z-PAK) 250 MG tablet       Follow Up Instructions: Continue fluids and rest and isolation   mucinex (plain) to expectorate Phenergan-DM for cough  Prednisone for wheezing/tight breathing  zpack to cover bacteria just in case   Update if not starting to improve in a week or if worsening   Also if any new symptoms   It condition suddenly worsens go to the ER   I discussed the assessment and treatment plan with the patient. The patient was provided an opportunity to ask questions and all were answered. The patient agreed with the plan and demonstrated an understanding of the instructions.   The patient  was advised to call back or seek an in-person evaluation if the symptoms worsen or if the condition fails to improve as anticipated.  I provided 16 minutes of non-face-to-face time during this encounter.   Loura Pardon, MD

## 2019-03-17 NOTE — Telephone Encounter (Signed)
[name] - Client Nonclinical Telephone Record [facility]urse Client [place] Night - Client Client Site [place] Physician [name]- NP Contact Type Call Who Is Calling Patient / Member / Family / Caregiver Caller Name [place] Phone Number [phone] Patient Name Debra Adkins Patient DOB 01-Oct-1960 Call Type Message Only Information Provided Reason for Call Request for General Office Information Initial Comment Caller states that she would like to speak to [name]. Additional Comment Caller states that she would like to speak to her NP, [name], directly about her current symptoms. She preferred to send a message to the office to ask them to have her call her back directly. Confirmed office hours with caller as well. Disp. Time Disposition Final User 03/17/2019 7:46:02 AM General Information Provided Yes [name] Call Closed By: [name] Transaction Date/Time: 03/17/2019 7:43:25 AM (ET)

## 2019-03-17 NOTE — Telephone Encounter (Signed)
Saw note from telehealth visit with Dr. Glori Bickers today. Called patient to express condolences on the death of her mother who was also my patient. Reminded patient to notify office if no improvement with steroids, antibiotic. Go to ER/ call 911 if worsening symptoms.

## 2019-03-17 NOTE — Telephone Encounter (Signed)
Pt already has virtual appt with Dr Glori Bickers on 01/14/20 at 12:15pm.

## 2019-03-17 NOTE — Telephone Encounter (Signed)
I will see her then  

## 2019-03-17 NOTE — Assessment & Plan Note (Addendum)
Diagnosed 1/25 after close exposure  Symptoms got better and now worse (resp) Developing prod cough and wheeze (worrisome for secondary bronchitis or early pneumonia) in pt with h/o reactive airways  Prednisone taper and zpak sent  Also prometh-DM for cough  otc expectorant adv Fluids/rest/isolate ER if suddenly worse  Update if not starting to improve in a week or if worsening  -or if any new symptoms

## 2019-03-17 NOTE — Patient Instructions (Addendum)
Continue fluids and rest and isolation   mucinex (plain) to expectorate Phenergan-DM for cough  Prednisone for wheezing/tight breathing  zpack to cover bacteria just in case   Update if not starting to improve in a week or if worsening   Also if any new symptoms   It condition suddenly worsens go to the ER

## 2019-03-18 ENCOUNTER — Ambulatory Visit: Payer: 59 | Admitting: Family Medicine

## 2019-05-11 ENCOUNTER — Other Ambulatory Visit: Payer: Self-pay | Admitting: Family Medicine

## 2019-05-11 DIAGNOSIS — E038 Other specified hypothyroidism: Secondary | ICD-10-CM

## 2019-05-11 DIAGNOSIS — E063 Autoimmune thyroiditis: Secondary | ICD-10-CM

## 2019-05-11 NOTE — Telephone Encounter (Signed)
Rx was last refilled on 04/05/18 for #90 with 3 refills. Patient was last seen in the office on 06/24/18 and has no upcoming appts. Jackelyn Poling, is this ok to refill?

## 2019-06-21 ENCOUNTER — Other Ambulatory Visit: Payer: Self-pay | Admitting: Family Medicine

## 2019-06-21 DIAGNOSIS — E038 Other specified hypothyroidism: Secondary | ICD-10-CM

## 2019-06-21 DIAGNOSIS — E063 Autoimmune thyroiditis: Secondary | ICD-10-CM

## 2019-07-29 ENCOUNTER — Other Ambulatory Visit: Payer: Self-pay | Admitting: Family Medicine

## 2019-07-29 ENCOUNTER — Encounter: Payer: Self-pay | Admitting: Family Medicine

## 2019-07-29 DIAGNOSIS — E038 Other specified hypothyroidism: Secondary | ICD-10-CM

## 2019-07-29 MED ORDER — LEVOTHYROXINE SODIUM 100 MCG PO TABS: 100.0000 ug | ORAL_TABLET | Freq: Every day | ORAL | 0 refills | Status: DC

## 2019-10-06 ENCOUNTER — Other Ambulatory Visit: Payer: Self-pay | Admitting: Family Medicine

## 2019-10-06 DIAGNOSIS — E063 Autoimmune thyroiditis: Secondary | ICD-10-CM

## 2019-10-06 DIAGNOSIS — Z9889 Other specified postprocedural states: Secondary | ICD-10-CM

## 2019-10-06 DIAGNOSIS — E538 Deficiency of other specified B group vitamins: Secondary | ICD-10-CM

## 2019-10-06 DIAGNOSIS — E038 Other specified hypothyroidism: Secondary | ICD-10-CM

## 2019-10-07 ENCOUNTER — Other Ambulatory Visit: Payer: Self-pay

## 2019-10-07 ENCOUNTER — Other Ambulatory Visit (INDEPENDENT_AMBULATORY_CARE_PROVIDER_SITE_OTHER): Payer: Managed Care, Other (non HMO)

## 2019-10-07 DIAGNOSIS — Z9889 Other specified postprocedural states: Secondary | ICD-10-CM | POA: Diagnosis not present

## 2019-10-07 DIAGNOSIS — E063 Autoimmune thyroiditis: Secondary | ICD-10-CM | POA: Diagnosis not present

## 2019-10-07 DIAGNOSIS — E538 Deficiency of other specified B group vitamins: Secondary | ICD-10-CM

## 2019-10-07 DIAGNOSIS — E559 Vitamin D deficiency, unspecified: Secondary | ICD-10-CM | POA: Diagnosis not present

## 2019-10-07 DIAGNOSIS — E038 Other specified hypothyroidism: Secondary | ICD-10-CM | POA: Diagnosis not present

## 2019-10-07 LAB — COMPREHENSIVE METABOLIC PANEL
ALT: 18 U/L (ref 0–35)
AST: 13 U/L (ref 0–37)
Albumin: 4.3 g/dL (ref 3.5–5.2)
Alkaline Phosphatase: 59 U/L (ref 39–117)
BUN: 19 mg/dL (ref 6–23)
CO2: 28 mEq/L (ref 19–32)
Calcium: 9.2 mg/dL (ref 8.4–10.5)
Chloride: 105 mEq/L (ref 96–112)
Creatinine, Ser: 0.79 mg/dL (ref 0.40–1.20)
GFR: 74.5 mL/min (ref >60.00)
Glucose, Bld: 81 mg/dL (ref 70–99)
Potassium: 3.9 mEq/L (ref 3.5–5.1)
Sodium: 141 mEq/L (ref 135–145)
Total Bilirubin: 0.7 mg/dL (ref 0.2–1.2)
Total Protein: 6.6 g/dL (ref 6.0–8.3)

## 2019-10-07 LAB — CBC WITH DIFFERENTIAL/PLATELET
Basophils Absolute: 0.1 10*3/uL (ref 0.0–0.1)
Basophils Relative: 1 % (ref 0.0–3.0)
Eosinophils Absolute: 0.3 10*3/uL (ref 0.0–0.7)
Eosinophils Relative: 4 % (ref 0.0–5.0)
HCT: 42.6 % (ref 36.0–46.0)
Hemoglobin: 14.6 g/dL (ref 12.0–15.0)
Lymphocytes Relative: 33.3 % (ref 12.0–46.0)
Lymphs Abs: 2.4 10*3/uL (ref 0.7–4.0)
MCHC: 34.3 g/dL (ref 30.0–36.0)
MCV: 92.6 fl (ref 78.0–100.0)
Monocytes Absolute: 0.4 10*3/uL (ref 0.1–1.0)
Monocytes Relative: 5.6 % (ref 3.0–12.0)
Neutro Abs: 4.1 10*3/uL (ref 1.4–7.7)
Neutrophils Relative %: 56.1 % (ref 43.0–77.0)
Platelets: 207 10*3/uL (ref 150.0–400.0)
RBC: 4.6 Mil/uL (ref 3.87–5.11)
RDW: 13.3 % (ref 11.5–15.5)
WBC: 7.3 10*3/uL (ref 4.0–10.5)

## 2019-10-07 LAB — VITAMIN D 25 HYDROXY (VIT D DEFICIENCY, FRACTURES): VITD: 26.22 ng/mL — ABNORMAL LOW (ref 30.00–100.00)

## 2019-10-07 LAB — LIPID PANEL
Cholesterol: 176 mg/dL (ref 0–200)
HDL: 85.7 mg/dL (ref >39.00)
LDL Cholesterol: 79 mg/dL (ref 0–99)
NonHDL: 90.58
Total CHOL/HDL Ratio: 2
Triglycerides: 56 mg/dL (ref 0.0–149.0)
VLDL: 11.2 mg/dL (ref 0.0–40.0)

## 2019-10-07 LAB — VITAMIN B12: Vitamin B-12: 219 pg/mL (ref 211–911)

## 2019-10-07 LAB — T4, FREE: Free T4: 0.91 ng/dL (ref 0.60–1.60)

## 2019-10-07 LAB — TSH: TSH: 5.1 u[IU]/mL — ABNORMAL HIGH (ref 0.35–4.50)

## 2019-10-08 LAB — T3: T3, Total: 82 ng/dL (ref 76–181)

## 2019-10-17 ENCOUNTER — Other Ambulatory Visit: Payer: Self-pay

## 2019-10-17 ENCOUNTER — Encounter: Payer: Self-pay | Admitting: Family Medicine

## 2019-10-17 ENCOUNTER — Other Ambulatory Visit: Payer: Self-pay | Admitting: Family Medicine

## 2019-10-17 ENCOUNTER — Ambulatory Visit (INDEPENDENT_AMBULATORY_CARE_PROVIDER_SITE_OTHER): Payer: Managed Care, Other (non HMO) | Admitting: Family Medicine

## 2019-10-17 VITALS — BP 122/70 | HR 69 | Temp 97.9°F | Ht 60.5 in | Wt 166.5 lb

## 2019-10-17 DIAGNOSIS — Z9889 Other specified postprocedural states: Secondary | ICD-10-CM

## 2019-10-17 DIAGNOSIS — R1900 Intra-abdominal and pelvic swelling, mass and lump, unspecified site: Secondary | ICD-10-CM

## 2019-10-17 DIAGNOSIS — E038 Other specified hypothyroidism: Secondary | ICD-10-CM

## 2019-10-17 DIAGNOSIS — R1011 Right upper quadrant pain: Secondary | ICD-10-CM

## 2019-10-17 DIAGNOSIS — E538 Deficiency of other specified B group vitamins: Secondary | ICD-10-CM

## 2019-10-17 DIAGNOSIS — E063 Autoimmune thyroiditis: Secondary | ICD-10-CM

## 2019-10-17 DIAGNOSIS — J4540 Moderate persistent asthma, uncomplicated: Secondary | ICD-10-CM

## 2019-10-17 MED ORDER — CYANOCOBALAMIN 1000 MCG/ML IJ SOLN: 1000.0000 ug | INTRAMUSCULAR | 3 refills | Status: DC

## 2019-10-17 MED ORDER — LEVOTHYROXINE SODIUM 112 MCG PO TABS: 112.0000 ug | ORAL_TABLET | Freq: Every day | ORAL | 0 refills | Status: DC

## 2019-10-17 NOTE — Progress Notes (Signed)
Subjective:    Patient ID: Debra Adkins, female    DOB: [birth year], 59 y.o.   MRN: [id number]  HPI Chief Complaint  Patient presents with  . Follow-up    labs    Having an intermittent "knot" on upper right abdomen. Accompanied by severe pain, size of a "doorknob." Seems to come and go, she can't reproduce with movements. No change in bowel habits. Gall bladder removed years ago.   Increased reflux especially if she lies down will have painless regurgitation.   Asthma- using betamethasone one time a day, using albuterol a couple of times a day. For several months.   Sleeping ok, 6 hours. Increased fatigue. Not exercising regularly, able to do her normal activities.   Review of Systems Per HPI    Objective:   Physical Exam Vitals reviewed.  Constitutional:      General: She is not in acute distress.    Appearance: Normal appearance. She is obese. She is not ill-appearing, toxic-appearing or diaphoretic.  HENT:     Head: Normocephalic and atraumatic.  Cardiovascular:     Rate and Rhythm: Normal rate and regular rhythm.     Heart sounds: Normal heart sounds.  Pulmonary:     Effort: Pulmonary effort is normal.     Breath sounds: Normal breath sounds.  Abdominal:     General: Abdomen is flat. Bowel sounds are normal. There is no distension.     Palpations: Abdomen is soft. There is no mass.     Tenderness: There is no abdominal tenderness. There is no guarding or rebound.     Hernia: No hernia is present.     Comments: Patient examined sitting and supine. No visible or palpable mass.   Skin:    General: Skin is warm and dry.  Neurological:     Mental Status: She is alert and oriented to person, place, and time.  Psychiatric:        Mood and Affect: Mood normal.        Behavior: Behavior normal.        Thought Content: Thought content normal.        Judgment: Judgment normal.       BP 122/70   Pulse 69   Temp 97.9 F (36.6 C) (Temporal)   Ht 5' 0.5" (1.537 m)    Wt 166 lb 8 oz (75.5 kg)   SpO2 98%   BMI 31.98 kg/m  Wt Readings from Last 3 Encounters:  10/17/19 166 lb 8 oz (75.5 kg)  07/14/18 163 lb 8 oz (74.2 kg)  05/12/18 168 lb (76.2 kg)       Assessment & Plan:  1. Right upper quadrant abdominal pain - unclear if this is a hernia that is intermittently palpable. Discussed getting CT - CT Abdomen Pelvis W Contrast; Future  2. B12 deficiency - cyanocobalamin (,VITAMIN B-12,) 1000 MCG/ML injection; Inject 1 mL (1,000 mcg total) into the skin every 7 (seven) days.  Dispense: 12 mL; Refill: 3  3. History of gastric surgery - cyanocobalamin (,VITAMIN B-12,) 1000 MCG/ML injection; Inject 1 mL (1,000 mcg total) into the skin every 7 (seven) days.  Dispense: 12 mL; Refill: 3  4. Hypothyroidism due to Hashimoto's thyroiditis - levothyroxine (SYNTHROID) 112 MCG tablet; Take 1 tablet (112 mcg total) by mouth daily before breakfast.  Dispense: 90 tablet; Refill: 0  5. Abdominal wall bulge - unable to palpate in office today, per patient this is an intermittent symptom.  - CT Abdomen Pelvis W  Contrast; Future  6. Moderate persistent asthma without complication - discussed use of ICS inhaler and short acting. She was instructed to use her betamethasone inhaler bid, if no improvement in symptoms, follow up. May need dose increase or referral to pulmonary.   This visit occurred during the SARS-CoV-2 public health emergency.  Safety protocols were in place, including screening questions prior to the visit, additional usage of staff PPE, and extensive cleaning of exam room while observing appropriate contact time as indicated for disinfecting solutions.      Clarene Reamer, FNP-BC  [facility] at [facility]  10/20/2019 4:54 PM

## 2019-10-18 ENCOUNTER — Other Ambulatory Visit: Payer: Self-pay | Admitting: Family Medicine

## 2019-10-20 ENCOUNTER — Encounter: Payer: Self-pay | Admitting: Family Medicine

## 2019-10-20 DIAGNOSIS — Z9889 Other specified postprocedural states: Secondary | ICD-10-CM | POA: Insufficient documentation

## 2019-10-20 DIAGNOSIS — E063 Autoimmune thyroiditis: Secondary | ICD-10-CM | POA: Insufficient documentation

## 2019-10-20 DIAGNOSIS — E038 Other specified hypothyroidism: Secondary | ICD-10-CM | POA: Insufficient documentation

## 2019-10-20 DIAGNOSIS — J4540 Moderate persistent asthma, uncomplicated: Secondary | ICD-10-CM | POA: Insufficient documentation

## 2019-10-20 DIAGNOSIS — E538 Deficiency of other specified B group vitamins: Secondary | ICD-10-CM | POA: Insufficient documentation

## 2019-10-24 ENCOUNTER — Telehealth: Payer: Self-pay | Admitting: Family Medicine

## 2019-10-24 ENCOUNTER — Other Ambulatory Visit: Payer: Self-pay | Admitting: Family Medicine

## 2019-10-24 ENCOUNTER — Encounter: Payer: Self-pay | Admitting: Family Medicine

## 2019-10-24 DIAGNOSIS — J4540 Moderate persistent asthma, uncomplicated: Secondary | ICD-10-CM

## 2019-10-24 MED ORDER — PREDNISONE 10 MG PO TABS: ORAL_TABLET | ORAL | 0 refills | Status: DC

## 2019-10-24 NOTE — Telephone Encounter (Signed)
Pt says pharmacy did not have the prednisone rx you were going to call in, but they had her husband's rx.  Was hers called in at same time? Please advise. Thank you!

## 2019-10-24 NOTE — Telephone Encounter (Signed)
Called patient and left her a message. Prednisone was sent in 11:08 today, receipt confirmed by pharmacy.

## 2019-10-24 NOTE — Progress Notes (Signed)
Patient was with her husband who was being seen in office. Reports increased wheeze. Was seen by me recently. Increased her Qvar to BID. Had Covid earlier this year with increased SOB, significant improvement with prednisone. Will send in prednisone taper today. She is overdue follow up for her history of carcinoid tumor, has GI appointment next month. Reports that her breathing gets like this when she needs to have EGD with polyp removal.

## 2019-11-15 ENCOUNTER — Ambulatory Visit
Admission: RE | Admit: 2019-11-15 | Discharge: 2019-11-15 | Disposition: A | Discharge status: Home / Self Care | Payer: Managed Care, Other (non HMO) | Source: Ambulatory Visit | Attending: Family Medicine | Admitting: Family Medicine

## 2019-11-15 DIAGNOSIS — R1011 Right upper quadrant pain: Secondary | ICD-10-CM

## 2019-11-15 DIAGNOSIS — R1900 Intra-abdominal and pelvic swelling, mass and lump, unspecified site: Secondary | ICD-10-CM

## 2019-11-15 MED ORDER — IOPAMIDOL (ISOVUE-300) INJECTION 61%
100.0000 mL | Freq: Once | INTRAVENOUS | Status: AC | PRN
  Administered 2019-11-15: 100 mL via INTRAVENOUS

## 2019-11-16 ENCOUNTER — Encounter: Payer: Self-pay | Admitting: Family Medicine

## 2019-12-19 ENCOUNTER — Telehealth: Payer: Self-pay | Admitting: Family Medicine

## 2019-12-19 NOTE — Telephone Encounter (Signed)
Patient called in stating that insurance will not cover her vitamin b/d test. Pt stated she is needing this as it is medically necessary according to her previous physician. Asking if an authorization or claim can be re-filed for it.

## 2019-12-19 NOTE — Telephone Encounter (Signed)
Please call patient and ask her if she can provide a copy of the EOB and bill for me to review?

## 2019-12-20 NOTE — Telephone Encounter (Signed)
Called and spoke with patient. Patient stated that she will bring this in tomorrow morning, so that we can make a copy and provide this to [name], [place].

## 2019-12-21 NOTE — Telephone Encounter (Signed)
Copy of bill put on your desk.

## 2019-12-24 NOTE — Telephone Encounter (Signed)
Did you have a chance to look into this?

## 2019-12-26 NOTE — Telephone Encounter (Signed)
Noted.

## 2019-12-26 NOTE — Telephone Encounter (Signed)
Called billing and they have put it into review, patient notified. Will call billing in a couple of weeks to see if they were able to add and resubmit the new dx

## 2019-12-30 ENCOUNTER — Telehealth: Payer: Self-pay | Admitting: Family Medicine

## 2019-12-30 NOTE — Telephone Encounter (Signed)
Spoke to pt. Advised her that she would need to be seen to get an antibiotic. Advised her she would need to have a virtual visit due to sinus pressure/pain, nasal congestion. She will call Monday if she is not better.

## 2019-12-30 NOTE — Telephone Encounter (Signed)
She has a sinus infection and wanted to know about getting some type of medication or needing to know about what to do.

## 2020-02-03 ENCOUNTER — Telehealth: Payer: Self-pay

## 2020-02-03 DIAGNOSIS — E038 Other specified hypothyroidism: Secondary | ICD-10-CM

## 2020-02-03 DIAGNOSIS — E063 Autoimmune thyroiditis: Secondary | ICD-10-CM

## 2020-02-03 MED ORDER — LEVOTHYROXINE SODIUM 112 MCG PO TABS: 112.0000 ug | ORAL_TABLET | Freq: Every day | ORAL | 0 refills | Status: DC

## 2020-02-03 NOTE — Telephone Encounter (Signed)
Pharmacy requests refill on: Levothyroxine 112 mcg  LAST REFILL: 10/17/2019 (Q-90, R-0) LAST OV: 10/17/2019 NEXT OV: Not Scheduled  PHARMACY: [place], [place]  TSH (10/07/2019): 5.10

## 2020-03-15 ENCOUNTER — Other Ambulatory Visit: Payer: Self-pay

## 2020-03-15 DIAGNOSIS — R059 Cough, unspecified: Secondary | ICD-10-CM

## 2020-03-15 DIAGNOSIS — J4540 Moderate persistent asthma, uncomplicated: Secondary | ICD-10-CM

## 2020-03-15 DIAGNOSIS — R062 Wheezing: Secondary | ICD-10-CM

## 2020-03-15 NOTE — Telephone Encounter (Signed)
Patient came into office with concerns that they were not able to get there medications prior to their provider leaving the office. I offered TOC appts for both husband and wife and husband declined until he knows his sch. Wife has TOC for 04/12/20. Wife and husband are both needing medication renewals and are wanting a nurse to call and see what can be done for them until there appts    Please call and advise

## 2020-03-16 MED ORDER — QVAR REDIHALER 80 MCG/ACT IN AERB: 1.0000 | INHALATION_SPRAY | Freq: Two times a day (BID) | RESPIRATORY_TRACT | 5 refills | Status: DC

## 2020-03-16 MED ORDER — ALBUTEROL SULFATE HFA 108 (90 BASE) MCG/ACT IN AERS: INHALATION_SPRAY | RESPIRATORY_TRACT | 2 refills | Status: DC

## 2020-03-16 NOTE — Telephone Encounter (Signed)
Called and spoke with patient who stated that she needs a refill on the following medications:   Pharmacy requests refill on: Albuterol 108 mcg/act  LAST REFILL: 11/16/2018 (Q-18 g, R-2) LAST OV: 10/17/2019 NEXT OV: 04/12/2020 PHARMACY: [facility] [place], [place] requests refill on: Beclomethasone 80 mcg/act   LAST REFILL: 07/14/2018 (Q-10.6 g, R-5) LAST OV: 10/17/2019 NEXT OV: 04/12/2020 PHARMACY: [id number] [place], [place]   Patient would like to use [facility] [phone] in [place] for future use.

## 2020-03-26 ENCOUNTER — Telehealth: Payer: Self-pay

## 2020-03-26 NOTE — Telephone Encounter (Signed)
Pt called that her Qvar is more expensive than she would like and pt spoke with pharmacist at [place] and was advised that Flovent, Symbicort or Pulmicort might be more affordable with a PA. Pt thought a PA was already being done. Do not see PA info under chart review tab.Pt said she thinks she has enough Qvar to last until [facility] to DR [place] on 04/12/20 and if not pt will cb sooner. Pt plans to discuss with Dr Einar Pheasant at [facility] appt. Sending note to [facility] as FYI. See refill request on 03/15/20.

## 2020-03-26 NOTE — Telephone Encounter (Signed)
FYI

## 2020-03-26 NOTE — Telephone Encounter (Signed)
Noted can address at her appointment

## 2020-04-12 ENCOUNTER — Encounter: Payer: Managed Care, Other (non HMO) | Admitting: Family Medicine

## 2020-04-13 ENCOUNTER — Telehealth: Payer: Self-pay | Admitting: Family Medicine

## 2020-04-13 NOTE — Telephone Encounter (Signed)
error 

## 2020-04-26 ENCOUNTER — Other Ambulatory Visit: Payer: Self-pay | Admitting: Family Medicine

## 2020-04-26 DIAGNOSIS — E038 Other specified hypothyroidism: Secondary | ICD-10-CM

## 2020-05-02 ENCOUNTER — Ambulatory Visit: Payer: Managed Care, Other (non HMO) | Admitting: Family Medicine

## 2020-05-02 ENCOUNTER — Encounter: Payer: Self-pay | Admitting: Family Medicine

## 2020-05-02 ENCOUNTER — Encounter: Payer: Managed Care, Other (non HMO) | Admitting: Family Medicine

## 2020-05-02 ENCOUNTER — Other Ambulatory Visit: Payer: Self-pay

## 2020-05-02 VITALS — BP 126/82 | HR 75 | Temp 97.0°F | Ht 61.0 in | Wt 163.8 lb

## 2020-05-02 DIAGNOSIS — E063 Autoimmune thyroiditis: Secondary | ICD-10-CM

## 2020-05-02 DIAGNOSIS — K449 Diaphragmatic hernia without obstruction or gangrene: Secondary | ICD-10-CM | POA: Insufficient documentation

## 2020-05-02 DIAGNOSIS — Z85020 Personal history of malignant carcinoid tumor of stomach: Secondary | ICD-10-CM

## 2020-05-02 DIAGNOSIS — Z9889 Other specified postprocedural states: Secondary | ICD-10-CM

## 2020-05-02 DIAGNOSIS — E038 Other specified hypothyroidism: Secondary | ICD-10-CM | POA: Diagnosis not present

## 2020-05-02 DIAGNOSIS — J4540 Moderate persistent asthma, uncomplicated: Secondary | ICD-10-CM | POA: Diagnosis not present

## 2020-05-02 DIAGNOSIS — J4521 Mild intermittent asthma with (acute) exacerbation: Secondary | ICD-10-CM

## 2020-05-02 MED ORDER — PREDNISONE 50 MG PO TABS: 50.0000 mg | ORAL_TABLET | Freq: Every day | ORAL | 0 refills | Status: AC

## 2020-05-02 MED ORDER — FLUTICASONE PROPIONATE HFA 110 MCG/ACT IN AERO: 2.0000 | INHALATION_SPRAY | Freq: Two times a day (BID) | RESPIRATORY_TRACT | 12 refills | Status: DC

## 2020-05-02 MED ORDER — LEVOTHYROXINE SODIUM 125 MCG PO TABS: 125.0000 ug | ORAL_TABLET | Freq: Every day | ORAL | 0 refills | Status: DC

## 2020-05-02 MED ORDER — ALBUTEROL SULFATE HFA 108 (90 BASE) MCG/ACT IN AERS: 2.0000 | INHALATION_SPRAY | Freq: Four times a day (QID) | RESPIRATORY_TRACT | 0 refills | Status: DC | PRN

## 2020-05-02 NOTE — Assessment & Plan Note (Signed)
Lab Results  Component Value Date   TSH 5.10 (H) 10/07/2019   She notes feeling better on levothyroxine 125 mcg and would like to restart this. She would also like to stop liothyroine if possible. Will increase levo 112>125 mcg and stop liothyronine. She will return for labs in 6 weeks. If worsening symptoms off liothyronine or difficulty stopping may consider endocrine referral. Previous TSH >40 in setting of stopping liothyroine but that was on a lower dose of levothyroxine

## 2020-05-02 NOTE — Progress Notes (Signed)
Subjective:     Debra Adkins is a 60 y.o. female presenting for Transitions Of Care and Referral (Mammogram at [place] )     HPI  #Asthma - insurance issues - was on qvar - was suggested alternatives  - using albuterol every few hours - has not had qvar or been using - only uses qvar with flares - no specific trigger with flares   #Hypothyroidism - was seeing endocrine in [place] - had her dosage lowered and she feels this is not working    #hx of stomach cancer - recent EGD with her gastoenterologist due to worsening reflux - will get reflux when laying down - this seems to trigger asthma  Review of Systems   Social History   Tobacco Use  Smoking Status Former Smoker  . Packs/day: 0.10  . Types: Cigarettes  . Quit date: 01/20/2005  . Years since quitting: 15.2  Smokeless Tobacco Never Used        Objective:    BP Readings from Last 3 Encounters:  05/02/20 126/82  10/17/19 122/70  07/14/18 124/86   Wt Readings from Last 3 Encounters:  05/02/20 163 lb 12 oz (74.3 kg)  10/17/19 166 lb 8 oz (75.5 kg)  07/14/18 163 lb 8 oz (74.2 kg)    BP 126/82   Pulse 75   Temp (!) 97 F (36.1 C) (Temporal)   Ht 5\' 1"  (1.549 m)   Wt 163 lb 12 oz (74.3 kg)   SpO2 95%   BMI 30.94 kg/m    Physical Exam Constitutional:      General: She is not in acute distress.    Appearance: She is well-developed. She is not diaphoretic.  HENT:     Right Ear: External ear normal.     Left Ear: External ear normal.  Eyes:     Conjunctiva/sclera: Conjunctivae normal.  Cardiovascular:     Rate and Rhythm: Normal rate and regular rhythm.     Heart sounds: No murmur heard.   Pulmonary:     Effort: Pulmonary effort is normal.     Breath sounds: Decreased air movement present. Wheezing present.  Musculoskeletal:     Cervical back: Neck supple.  Skin:    General: Skin is warm and dry.     Capillary Refill: Capillary refill takes less than 2 seconds.  Neurological:      Mental Status: She is alert. Mental status is at baseline.  Psychiatric:        Mood and Affect: Mood normal.        Behavior: Behavior normal.           Assessment & Plan:   Problem List Items Addressed This Visit      Respiratory   Moderate persistent asthma without complication - Primary    Poorly controlled with exacerbation today. Suspect poor adherence to inhaled steroid contributing. Advised starting Flovent 110 mcg BID (insurance would not cover qvar) and using for the next few days. If no improvement in breathing start prednisone burst for improved lung. Cont prn albuterol. Advised regular use of Flovent to avoid flare ups.       Relevant Medications   albuterol (VENTOLIN HFA) 108 (90 Base) MCG/ACT inhaler   fluticasone (FLOVENT HFA) 110 MCG/ACT inhaler   predniSONE (DELTASONE) 50 MG tablet     Endocrine   Hypothyroidism due to Hashimoto's thyroiditis    Lab Results  Component Value Date   TSH 5.10 (H) 10/07/2019   She notes feeling better  on levothyroxine 125 mcg and would like to restart this. She would also like to stop liothyroine if possible. Will increase levo 112>125 mcg and stop liothyronine. She will return for labs in 6 weeks. If worsening symptoms off liothyronine or difficulty stopping may consider endocrine referral. Previous TSH >40 in setting of stopping liothyroine but that was on a lower dose of levothyroxine       Relevant Medications   levothyroxine (SYNTHROID) 125 MCG tablet   Other Relevant Orders   TSH   T4, free   T3, free     Other   History of gastric surgery   Hiatal hernia   History of malignant carcinoid tumor of stomach    S/p surgery. Follows with Dr. Trevor Mace. Had EGD recently due to worsening symptoms. Reflux triggers and worsens asthma symptoms. Appreciate GI support will request records. Cont hyoscyamine and promethazine prn       Other Visit Diagnoses    Mild intermittent asthma with exacerbation       Relevant  Medications   albuterol (VENTOLIN HFA) 108 (90 Base) MCG/ACT inhaler   fluticasone (FLOVENT HFA) 110 MCG/ACT inhaler   predniSONE (DELTASONE) 50 MG tablet       Return in about 6 weeks (around 06/13/2020) for for labs.  Lesleigh Noe, MD  This visit occurred during the SARS-CoV-2 public health emergency.  Safety protocols were in place, including screening questions prior to the visit, additional usage of staff PPE, and extensive cleaning of exam room while observing appropriate contact time as indicated for disinfecting solutions.

## 2020-05-02 NOTE — Assessment & Plan Note (Signed)
Poorly controlled with exacerbation today. Suspect poor adherence to inhaled steroid contributing. Advised starting Flovent 110 mcg BID (insurance would not cover qvar) and using for the next few days. If no improvement in breathing start prednisone burst for improved lung. Cont prn albuterol. Advised regular use of Flovent to avoid flare ups.

## 2020-05-02 NOTE — Patient Instructions (Signed)
Return for lab appointment in 6 weeks  Return to see me only if not doing well  Start Levothyroxine 125 mcg today  Stop the Levo 112 mcg and the Liothyronine

## 2020-05-02 NOTE — Assessment & Plan Note (Addendum)
S/p surgery. Follows with Dr. Trevor Mace. Had EGD recently due to worsening symptoms. Reflux triggers and worsens asthma symptoms. Appreciate GI support will request records. Cont hyoscyamine and promethazine prn

## 2020-05-03 ENCOUNTER — Telehealth: Payer: Self-pay

## 2020-05-03 NOTE — Telephone Encounter (Signed)
Received a PA from [facility] for: Albuterol sulfate HFA 108 MCG/ACT  Entered PA through cover my meds.   Waiting for response.

## 2020-05-08 ENCOUNTER — Encounter: Payer: Managed Care, Other (non HMO) | Admitting: Family Medicine

## 2020-07-29 ENCOUNTER — Other Ambulatory Visit: Payer: Self-pay | Admitting: Family Medicine

## 2020-07-29 DIAGNOSIS — E038 Other specified hypothyroidism: Secondary | ICD-10-CM

## 2020-07-30 NOTE — Telephone Encounter (Signed)
Called pt to schedule an appt. Pt did not answer and voicemail is full

## 2020-08-13 ENCOUNTER — Other Ambulatory Visit: Payer: Self-pay | Admitting: Family Medicine

## 2020-08-13 DIAGNOSIS — E038 Other specified hypothyroidism: Secondary | ICD-10-CM

## 2020-08-14 NOTE — Telephone Encounter (Signed)
Call patient no answer . Voice mail is full

## 2020-10-17 ENCOUNTER — Telehealth: Payer: Self-pay | Admitting: Family Medicine

## 2020-10-17 DIAGNOSIS — E063 Autoimmune thyroiditis: Secondary | ICD-10-CM

## 2020-10-17 DIAGNOSIS — E038 Other specified hypothyroidism: Secondary | ICD-10-CM

## 2020-10-19 NOTE — Telephone Encounter (Signed)
Pt said she don't have time to schedule apt. And pt was not impressed with dr. Einar Pheasant and she will try to find another provider at [facility]

## 2020-10-24 ENCOUNTER — Other Ambulatory Visit: Payer: Self-pay | Admitting: Family Medicine

## 2020-10-24 DIAGNOSIS — J4540 Moderate persistent asthma, uncomplicated: Secondary | ICD-10-CM

## 2020-10-25 ENCOUNTER — Ambulatory Visit: Payer: Managed Care, Other (non HMO) | Admitting: Nurse Practitioner

## 2020-10-25 ENCOUNTER — Other Ambulatory Visit: Payer: Self-pay

## 2020-10-25 ENCOUNTER — Encounter: Payer: Self-pay | Admitting: Nurse Practitioner

## 2020-10-25 VITALS — BP 130/88 | HR 99 | Temp 98.0°F | Resp 12 | Ht 61.0 in | Wt 166.5 lb

## 2020-10-25 DIAGNOSIS — E538 Deficiency of other specified B group vitamins: Secondary | ICD-10-CM | POA: Diagnosis not present

## 2020-10-25 DIAGNOSIS — E038 Other specified hypothyroidism: Secondary | ICD-10-CM

## 2020-10-25 DIAGNOSIS — Z85020 Personal history of malignant carcinoid tumor of stomach: Secondary | ICD-10-CM

## 2020-10-25 DIAGNOSIS — J4540 Moderate persistent asthma, uncomplicated: Secondary | ICD-10-CM

## 2020-10-25 DIAGNOSIS — E063 Autoimmune thyroiditis: Secondary | ICD-10-CM | POA: Diagnosis not present

## 2020-10-25 DIAGNOSIS — Z9889 Other specified postprocedural states: Secondary | ICD-10-CM | POA: Diagnosis not present

## 2020-10-25 MED ORDER — QVAR REDIHALER 80 MCG/ACT IN AERB: 1.0000 | INHALATION_SPRAY | Freq: Two times a day (BID) | RESPIRATORY_TRACT | 5 refills | Status: DC

## 2020-10-25 NOTE — Assessment & Plan Note (Signed)
Patient currently maintained on levothyroxine 125 mcg.  States she is on 2 different dosings in the past which seems to be levothyroxine and liothyronine.  We will continue levothyroxine 125 mcg pending labs today.  We will

## 2020-10-25 NOTE — Assessment & Plan Note (Signed)
Patient states Flovent not been effective.  States Qvar was better in the past but had difficulty getting with insurance coverage.  Looking today computer does state that Qvar is covered on her formulary we will switch to Qvar 80 mcg 1 puff twice daily.  Patient was instructed to stop Flovent.  She continues an albuterol inhaler as needed.  If she still has continual use of albuterol inhaler as described here today we will need to increase Qvar dosing or consider adjunct therapy.

## 2020-10-25 NOTE — Assessment & Plan Note (Signed)
History of same.  Has had gastric sleeve procedure.  Started by GI but now managed by primary care provider.  Last lab was approximately 1 year ago we will recheck labs today.  Patient gives 1000 mcg injection once weekly.  Continue pending lab results

## 2020-10-25 NOTE — Assessment & Plan Note (Signed)
[facility] followed [facility].  Continue following up with gastro group as recommended to continue medications prescribed by them as recommended.

## 2020-10-25 NOTE — Patient Instructions (Signed)
Nice  see you Stop Flovent and start using QVAR See you in 6 months

## 2020-10-25 NOTE — Progress Notes (Signed)
Established Patient Office Visit  Subjective:  Patient ID: Debra Adkins, female    DOB: 1961-01-14  Age: 60 y.o. MRN: [id number]  CC:  Chief Complaint  Patient presents with   Hypothyroidism    Follow up     HPI Debra Adkins presents for   Asthma: Flovent Albuterol 2-3 times daily. Waking up at night with wheezing. State she is unsure if it is her or the dogs. States that she was on QVAR and it worked better but had to change because of insurance.  Thyroid: Levothyroxine 125 mcg needs tsh updated. States that she use to be on 2 different doses that worked well. Had a thyroidectomy in the past  B12 def: 1039mcg injection weekly. Poor nutrient absorber. Last level checked approx 1 year ago.  Carcinoid tumor of stomach: Dr. Farrel Demark [facility].   Past Medical History:  Diagnosis Date   Allergy    Asthma    Blood transfusion without reported diagnosis    Carcinoid tumor of colon    Followed by Dr. Karis Juba in [place]   Chicken pox    Constipation    Diverticulosis    Hypertension    Thyroid disease    hoshimotos's thyroiditis at age 36    Past Surgical History:  Procedure Laterality Date   ABDOMINAL HYSTERECTOMY     DG GALL BLADDER     LAPAROSCOPIC GASTRIC SLEEVE RESECTION     THYROIDECTOMY      Family History  Problem Relation Age of Onset   Heart disease Father    Hyperlipidemia Father    Diabetes Brother    Heart disease Brother    Testicular cancer Brother    Heart disease Brother     Social History   Socioeconomic History   Marital status: Married    Spouse name: Denman George   Number of children: 0   Years of education: some college   Highest education level: Not on file  Occupational History   Not on file  Tobacco Use   Smoking status: Former    Packs/day: 0.25    Years: 20.00    Pack years: 5.00    Types: Cigarettes    Quit date: 01/20/2005    Years since quitting: 15.7   Smokeless tobacco: Never  Vaping Use   Vaping  Use: Never used  Substance and Sexual Activity   Alcohol use: Yes    Comment: 2-4 glasses of wine, 8-10 in a week   Drug use: Never   Sexual activity: Not Currently  Other Topics Concern   Not on file  Social History Narrative   05/02/20   From: [place], but moved when she was high school   Living: with Denman George, husband (2019) but together since 2005   Work: not currently due to health issues      Family: brother in [place]      Enjoys: rescue dogs - has 5      Exercise: not currently due to joint issues   Diet: low calorie, salads      Safety   Seat belts: Yes    Guns: Yes  and secure   Safe in relationships: Yes    Social Determinants of Health   Financial Resource Strain: Not on file  Food Insecurity: Not on file  Transportation Needs: Not on file  Physical Activity: Not on file  Stress: Not on file  Social Connections: Not on file  Intimate Partner Violence: Not on file    Outpatient  Medications Prior to Visit  Medication Sig Dispense Refill   albuterol (VENTOLIN HFA) 108 (90 Base) MCG/ACT inhaler INHALE 2 PUFFS INTO THE LUNGS EVERY SIX HOURS AS NEEDED FOR WHEEZING OR SHORTNESS OF BREATH. 18 each 1   cyanocobalamin (,VITAMIN B-12,) 1000 MCG/ML injection Inject 1 mL (1,000 mcg total) into the skin every 7 (seven) days. 12 mL 3   fluticasone (FLOVENT HFA) 110 MCG/ACT inhaler Inhale 2 puffs into the lungs in the morning and at bedtime. 1 each 12   hyoscyamine (LEVSIN SL) 0.125 MG SL tablet Place 0.125 mg under the tongue 4 (four) times daily as needed.     Pediatric Multiple Vitamins (FLINTSTONES MULTIVITAMIN PO) Take by mouth.     promethazine (PHENERGAN) 25 MG tablet Take 12.5-25 mg by mouth 3 (three) times daily as needed.     Syringe/Needle, Disp, 25G X 1" 1 ML MISC Use one syringe to administer B12 injection 25 each 1   VITAMIN D PO Take by mouth.     levothyroxine (SYNTHROID) 125 MCG tablet TAKE 1 TABLET BY MOUTH EVERY DAY (Patient not taking: Reported on  10/25/2020) 30 tablet 0   No facility-administered medications prior to visit.    Allergies  Allergen Reactions   Codeine Rash    nausea    ROS Review of Systems  Constitutional:  Positive for fatigue. Negative for chills and fever.  Respiratory:  Negative for cough and shortness of breath.   Cardiovascular:  Negative for chest pain.  Gastrointestinal:  Positive for abdominal pain (intermittently dealing with her hernia) and diarrhea. Negative for constipation, nausea and vomiting.  Neurological:  Negative for weakness and numbness.     Objective:    Physical Exam Vitals and nursing note reviewed.  Constitutional:      Appearance: Normal appearance.  HENT:     Right Ear: Tympanic membrane, ear canal and external ear normal. There is no impacted cerumen.     Left Ear: Tympanic membrane, ear canal and external ear normal. There is no impacted cerumen.     Mouth/Throat:     Mouth: Mucous membranes are moist.     Pharynx: Oropharynx is clear.  Eyes:     Extraocular Movements: Extraocular movements intact.     Pupils: Pupils are equal, round, and reactive to light.  Neck:     Comments: Scar from thyroidectomy Cardiovascular:     Rate and Rhythm: Normal rate and regular rhythm.  Pulmonary:     Effort: Pulmonary effort is normal.     Breath sounds: Normal breath sounds.  Abdominal:     General: Bowel sounds are normal. There is no distension.     Palpations: Abdomen is soft. There is no mass.     Tenderness: There is no abdominal tenderness.     Hernia: No hernia is present.  Musculoskeletal:     Right lower leg: No edema.     Left lower leg: No edema.  Lymphadenopathy:     Cervical: No cervical adenopathy.  Neurological:     General: No focal deficit present.     Mental Status: She is alert.     Motor: No weakness.     Coordination: Coordination normal.     Gait: Gait normal.     Deep Tendon Reflexes: Reflexes normal.  Psychiatric:        Mood and Affect: Mood  normal.        Behavior: Behavior normal.        Thought Content: Thought content normal.  Judgment: Judgment normal.    BP 130/88   Pulse 99   Temp 98 F (36.7 C)   Resp 12   Ht 5\' 1"  (1.549 m)   Wt 166 lb 8 oz (75.5 kg)   SpO2 97%   BMI 31.46 kg/m  Wt Readings from Last 3 Encounters:  10/25/20 166 lb 8 oz (75.5 kg)  05/02/20 163 lb 12 oz (74.3 kg)  10/17/19 166 lb 8 oz (75.5 kg)     Health Maintenance Due  Topic Date Due   HIV Screening  Never done   Hepatitis C Screening  Never done   TETANUS/TDAP  Never done   PAP SMEAR-Modifier  Never done   MAMMOGRAM  Never done   Zoster Vaccines- Shingrix (1 of 2) Never done    There are no preventive care reminders to display for this patient.  Lab Results  Component Value Date   TSH 5.10 (H) 10/07/2019   Lab Results  Component Value Date   WBC 7.3 10/07/2019   HGB 14.6 10/07/2019   HCT 42.6 10/07/2019   MCV 92.6 10/07/2019   PLT 207.0 10/07/2019   Lab Results  Component Value Date   NA 141 10/07/2019   K 3.9 10/07/2019   CO2 28 10/07/2019   GLUCOSE 81 10/07/2019   BUN 19 10/07/2019   CREATININE 0.79 10/07/2019   BILITOT 0.7 10/07/2019   ALKPHOS 59 10/07/2019   AST 13 10/07/2019   ALT 18 10/07/2019   PROT 6.6 10/07/2019   ALBUMIN 4.3 10/07/2019   CALCIUM 9.2 10/07/2019   GFR 74.50 10/07/2019   Lab Results  Component Value Date   CHOL 176 10/07/2019   Lab Results  Component Value Date   HDL 85.70 10/07/2019   Lab Results  Component Value Date   LDLCALC 79 10/07/2019   Lab Results  Component Value Date   TRIG 56.0 10/07/2019   Lab Results  Component Value Date   CHOLHDL 2 10/07/2019   No results found for: HGBA1C    Assessment & Plan:   Problem List Items Addressed This Visit       Respiratory   Moderate persistent asthma without complication    Patient states Flovent not been effective.  States Qvar was better in the past but had difficulty getting with insurance coverage.   Looking today computer does state that Qvar is covered on her formulary we will switch to Qvar 80 mcg 1 puff twice daily.  Patient was instructed to stop Flovent.  She continues an albuterol inhaler as needed.  If she still has continual use of albuterol inhaler as described here today we will need to increase Qvar dosing or consider adjunct therapy.      Relevant Medications   beclomethasone (QVAR REDIHALER) 80 MCG/ACT inhaler   Other Relevant Orders   CBC with Differential/Platelet     Endocrine   Hypothyroidism due to Hashimoto's thyroiditis    Patient currently maintained on levothyroxine 125 mcg.  States she is on 2 different dosings in the past which seems to be levothyroxine and liothyronine.  We will continue levothyroxine 125 mcg pending labs today.  We will      Relevant Orders   T4, free   TSH     Other   B12 deficiency - Primary    History of same.  Has had gastric sleeve procedure.  Started by GI but now managed by primary care provider.  Last lab was approximately 1 year ago we will recheck labs today.  Patient gives 1000 mcg injection once weekly.  Continue pending lab results      Relevant Orders   Vitamin B12   History of gastric surgery   Relevant Orders   Comprehensive metabolic panel   Folate   IBC + Ferritin   Vitamin B12   CBC with Differential/Platelet   History of malignant carcinoid tumor of stomach    [facility].  Continue following up with gastro group as recommended to continue medications prescribed by them as recommended.       No orders of the defined types were placed in this encounter.   Follow-up: Return in about 6 months (around 04/25/2021) for CPE and labs.   This visit occurred during the SARS-CoV-2 public health emergency.  Safety protocols were in place, including screening questions prior to the visit, additional usage of staff PPE, and extensive cleaning of exam room while observing  appropriate contact time as indicated for disinfecting solutions.   Romilda Garret, NP

## 2020-10-26 ENCOUNTER — Other Ambulatory Visit: Payer: Self-pay | Admitting: Nurse Practitioner

## 2020-10-26 DIAGNOSIS — E038 Other specified hypothyroidism: Secondary | ICD-10-CM

## 2020-10-26 LAB — COMPREHENSIVE METABOLIC PANEL
ALT: 30 U/L (ref 0–35)
AST: 20 U/L (ref 0–37)
Albumin: 4.7 g/dL (ref 3.5–5.2)
Alkaline Phosphatase: 69 U/L (ref 39–117)
BUN: 19 mg/dL (ref 6–23)
CO2: 29 mEq/L (ref 19–32)
Calcium: 9.8 mg/dL (ref 8.4–10.5)
Chloride: 103 mEq/L (ref 96–112)
Creatinine, Ser: 0.92 mg/dL (ref 0.40–1.20)
GFR: 67.92 mL/min (ref >60.00)
Glucose, Bld: 91 mg/dL (ref 70–99)
Potassium: 4.4 mEq/L (ref 3.5–5.1)
Sodium: 140 mEq/L (ref 135–145)
Total Bilirubin: 0.5 mg/dL (ref 0.2–1.2)
Total Protein: 7.5 g/dL (ref 6.0–8.3)

## 2020-10-26 LAB — CBC WITH DIFFERENTIAL/PLATELET
Basophils Absolute: 0.1 10*3/uL (ref 0.0–0.1)
Basophils Relative: 1 % (ref 0.0–3.0)
Eosinophils Absolute: 0.3 10*3/uL (ref 0.0–0.7)
Eosinophils Relative: 4.6 % (ref 0.0–5.0)
HCT: 45.5 % (ref 36.0–46.0)
Hemoglobin: 15.3 g/dL — ABNORMAL HIGH (ref 12.0–15.0)
Lymphocytes Relative: 24.8 % (ref 12.0–46.0)
Lymphs Abs: 1.8 10*3/uL (ref 0.7–4.0)
MCHC: 33.6 g/dL (ref 30.0–36.0)
MCV: 92.9 fl (ref 78.0–100.0)
Monocytes Absolute: 0.4 10*3/uL (ref 0.1–1.0)
Monocytes Relative: 5.5 % (ref 3.0–12.0)
Neutro Abs: 4.6 10*3/uL (ref 1.4–7.7)
Neutrophils Relative %: 64.1 % (ref 43.0–77.0)
Platelets: 226 10*3/uL (ref 150.0–400.0)
RBC: 4.9 Mil/uL (ref 3.87–5.11)
RDW: 13.1 % (ref 11.5–15.5)
WBC: 7.2 10*3/uL (ref 4.0–10.5)

## 2020-10-26 LAB — VITAMIN B12: Vitamin B-12: 419 pg/mL (ref 211–911)

## 2020-10-26 LAB — IBC + FERRITIN
Ferritin: 94.5 ng/mL (ref 10.0–291.0)
Iron: 90 ug/dL (ref 42–145)
Saturation Ratios: 22.1 % (ref 20.0–50.0)
TIBC: 407.4 ug/dL (ref 250.0–450.0)
Transferrin: 291 mg/dL (ref 212.0–360.0)

## 2020-10-26 LAB — T4, FREE: Free T4: 0.99 ng/dL (ref 0.60–1.60)

## 2020-10-26 LAB — FOLATE: Folate: 12.3 ng/mL (ref >5.9)

## 2020-10-26 LAB — TSH: TSH: 0.54 u[IU]/mL (ref 0.35–5.50)

## 2020-10-26 MED ORDER — LEVOTHYROXINE SODIUM 125 MCG PO TABS: 125.0000 ug | ORAL_TABLET | Freq: Every day | ORAL | 1 refills | Status: DC

## 2020-11-02 ENCOUNTER — Telehealth: Payer: Managed Care, Other (non HMO) | Admitting: Primary Care

## 2020-11-02 ENCOUNTER — Ambulatory Visit (HOSPITAL_COMMUNITY)
Admission: EM | Admit: 2020-11-02 | Discharge: 2020-11-02 | Disposition: A | Discharge status: Home / Self Care | Payer: Managed Care, Other (non HMO) | Attending: Medical Oncology | Admitting: Medical Oncology

## 2020-11-02 ENCOUNTER — Encounter (HOSPITAL_COMMUNITY): Payer: Self-pay | Admitting: Emergency Medicine

## 2020-11-02 ENCOUNTER — Ambulatory Visit (INDEPENDENT_AMBULATORY_CARE_PROVIDER_SITE_OTHER): Payer: Managed Care, Other (non HMO)

## 2020-11-02 ENCOUNTER — Other Ambulatory Visit: Payer: Self-pay

## 2020-11-02 DIAGNOSIS — J4540 Moderate persistent asthma, uncomplicated: Secondary | ICD-10-CM | POA: Diagnosis present

## 2020-11-02 DIAGNOSIS — R112 Nausea with vomiting, unspecified: Secondary | ICD-10-CM | POA: Diagnosis not present

## 2020-11-02 DIAGNOSIS — R197 Diarrhea, unspecified: Secondary | ICD-10-CM | POA: Diagnosis not present

## 2020-11-02 DIAGNOSIS — Z20822 Contact with and (suspected) exposure to covid-19: Secondary | ICD-10-CM | POA: Diagnosis not present

## 2020-11-02 DIAGNOSIS — R0989 Other specified symptoms and signs involving the circulatory and respiratory systems: Secondary | ICD-10-CM

## 2020-11-02 DIAGNOSIS — R059 Cough, unspecified: Secondary | ICD-10-CM

## 2020-11-02 DIAGNOSIS — R509 Fever, unspecified: Secondary | ICD-10-CM | POA: Diagnosis not present

## 2020-11-02 DIAGNOSIS — R051 Acute cough: Secondary | ICD-10-CM | POA: Diagnosis not present

## 2020-11-02 LAB — POCT URINALYSIS DIPSTICK, ED / UC
Bilirubin Urine: NEGATIVE
Glucose, UA: NEGATIVE mg/dL
Ketones, ur: NEGATIVE mg/dL
Leukocytes,Ua: NEGATIVE
Nitrite: NEGATIVE
Protein, ur: NEGATIVE mg/dL
Specific Gravity, Urine: >=1.03 (ref 1.005–1.030)
Urobilinogen, UA: 0.2 mg/dL (ref 0.0–1.0)
pH: 5.5 (ref 5.0–8.0)

## 2020-11-02 LAB — POC INFLUENZA A AND B ANTIGEN (URGENT CARE ONLY)
INFLUENZA A ANTIGEN, POC: NEGATIVE
INFLUENZA B ANTIGEN, POC: NEGATIVE

## 2020-11-02 MED ORDER — FLUTICASONE PROPIONATE 50 MCG/ACT NA SUSP: 2.0000 | Freq: Every day | NASAL | 0 refills | Status: DC

## 2020-11-02 MED ORDER — ALBUTEROL SULFATE HFA 108 (90 BASE) MCG/ACT IN AERS: 2.0000 | INHALATION_SPRAY | Freq: Four times a day (QID) | RESPIRATORY_TRACT | 1 refills | Status: DC | PRN

## 2020-11-02 MED ORDER — BENZONATATE 100 MG PO CAPS: 100.0000 mg | ORAL_CAPSULE | Freq: Three times a day (TID) | ORAL | 0 refills | Status: DC

## 2020-11-02 MED ORDER — KETOROLAC TROMETHAMINE 30 MG/ML IJ SOLN
INTRAMUSCULAR | Status: AC
  Filled 2020-11-02: qty 1

## 2020-11-02 MED ORDER — ONDANSETRON 4 MG PO TBDP: 4.0000 mg | ORAL_TABLET | Freq: Three times a day (TID) | ORAL | 0 refills | Status: DC | PRN

## 2020-11-02 MED ORDER — KETOROLAC TROMETHAMINE 30 MG/ML IJ SOLN
30.0000 mg | Freq: Once | INTRAMUSCULAR | Status: AC
  Administered 2020-11-02: 30 mg via INTRAMUSCULAR

## 2020-11-02 MED ORDER — ONDANSETRON 4 MG PO TBDP
4.0000 mg | ORAL_TABLET | Freq: Once | ORAL | Status: AC
  Administered 2020-11-02: 4 mg via ORAL

## 2020-11-02 MED ORDER — ONDANSETRON 4 MG PO TBDP
ORAL_TABLET | ORAL | Status: AC
  Filled 2020-11-02: qty 1

## 2020-11-02 NOTE — ED Triage Notes (Signed)
Monday had fatigue. Tuesday had nausea and diarrhea. Reports symptoms are getting worse and has headache. Reports woke up up with large whelps on base of anterior neck that was red and painful. Having back pains as well.

## 2020-11-02 NOTE — ED Provider Notes (Signed)
[place]    CSN: [id number] Arrival date & time: 11/02/20  1348      History   Chief Complaint Chief Complaint  Patient presents with   Nausea   Diarrhea    HPI Debra Adkins is a 60 y.o. female.   HPI  GI symptoms: Pt reports that starting on Monday she started having fatigue. The next day she developed non-bloody nausea and diarrhea. Also has had cough, slight increased urination, body aches, low garde fevers. Symptoms have gotten a bit worse and she now has a headache.  She also noticed that this morning she woke up and had a rash on the front of her neck that was red and painful.  She is having some sporadic back pains as well. She has tried aleve for symptoms without resolution of headache.  She denies any visual changes, photosensitivity or head injury.  Past Medical History:  Diagnosis Date   Allergy    Asthma    Blood transfusion without reported diagnosis    Carcinoid tumor of colon    Followed by Dr. Karis Juba in [place] pox    Constipation    Diverticulosis    Hypertension    Thyroid disease    hoshimotos's thyroiditis at age 61    Patient Active Problem List   Diagnosis Date Noted   Hiatal hernia 05/02/2020   History of malignant carcinoid tumor of stomach 05/02/2020   B12 deficiency 10/20/2019   History of gastric surgery 10/20/2019   Hypothyroidism due to Hashimoto's thyroiditis 10/20/2019   Moderate persistent asthma without complication 73/22/0254   COVID-19 03/17/2019    Past Surgical History:  Procedure Laterality Date   ABDOMINAL HYSTERECTOMY     states took everything but cervix?   DG GALL BLADDER     LAPAROSCOPIC GASTRIC SLEEVE RESECTION     THYROIDECTOMY      OB History   No obstetric history on file.      Home Medications    Prior to Admission medications   Medication Sig Start Date End Date Taking? Authorizing Provider  albuterol (VENTOLIN HFA) 108 (90 Base) MCG/ACT inhaler INHALE 2 PUFFS INTO  THE LUNGS EVERY SIX HOURS AS NEEDED FOR WHEEZING OR SHORTNESS OF BREATH. 10/24/20   Lesleigh Noe, MD  beclomethasone (QVAR REDIHALER) 80 MCG/ACT inhaler Inhale 1 puff into the lungs 2 (two) times daily. 10/25/20   Michela Pitcher, NP  cyanocobalamin (,VITAMIN B-12,) 1000 MCG/ML injection Inject 1 mL (1,000 mcg total) into the skin every 7 (seven) days. 10/17/19   Elby Beck, FNP  hyoscyamine (LEVSIN SL) 0.125 MG SL tablet Place 0.125 mg under the tongue 4 (four) times daily as needed. 04/17/20   [provider]  levothyroxine (SYNTHROID) 125 MCG tablet Take 1 tablet (125 mcg total) by mouth daily. 10/26/20   Michela Pitcher, NP  Pediatric Multiple Vitamins (FLINTSTONES MULTIVITAMIN PO) Take by mouth.    [provider]  promethazine (PHENERGAN) 25 MG tablet Take 12.5-25 mg by mouth 3 (three) times daily as needed. 04/16/20   [provider]  Syringe/Needle, Disp, 25G X 1" 1 ML MISC Use one syringe to administer B12 injection 07/14/18   Elby Beck, FNP  VITAMIN D PO Take by mouth.    [provider]    Family History Family History  Problem Relation Age of Onset   Heart disease Father    Hyperlipidemia Father    Other Father  Heart transplant   Diabetes Brother    Heart disease Brother    Testicular cancer Brother    Heart disease Brother    Heart attack Brother     Social History Social History   Tobacco Use   Smoking status: Former    Packs/day: 0.25    Years: 20.00    Pack years: 5.00    Types: Cigarettes    Quit date: 01/20/2005    Years since quitting: 15.7   Smokeless tobacco: Never  Vaping Use   Vaping Use: Never used  Substance Use Topics   Alcohol use: Yes    Comment: 2-4 glasses of wine, 8-10 in a week   Drug use: Never     Allergies   Codeine   Review of Systems Review of Systems  As stated above in HPI  Physical Exam Triage Vital Signs ED Triage Vitals  Enc Vitals Group     BP 11/02/20 1459 (!)  151/84     Pulse Rate 11/02/20 1459 79     Resp 11/02/20 1459 17     Temp 11/02/20 1459 98.1 F (36.7 C)     Temp Source 11/02/20 1459 Oral     SpO2 11/02/20 1459 98 %     Weight --      Height --      Head Circumference --      Peak Flow --      Pain Score 11/02/20 1458 7     Pain Loc --      Pain Edu? --      Excl. in [place]? --    No data found.  Updated Vital Signs BP (!) 151/84 (BP Location: Left Arm)   Pulse 79   Temp 98.1 F (36.7 C) (Oral)   Resp 17   SpO2 98%   Physical Exam Vitals and nursing note reviewed.  Constitutional:      General: She is not in acute distress.    Appearance: Normal appearance. She is ill-appearing (mild). She is not toxic-appearing or diaphoretic.  HENT:     Head: Normocephalic and atraumatic.     Right Ear: Tympanic membrane normal.     Left Ear: Tympanic membrane normal.     Nose: Rhinorrhea (slight) present.     Mouth/Throat:     Mouth: Mucous membranes are moist.     Pharynx: Oropharynx is clear. No oropharyngeal exudate or posterior oropharyngeal erythema.  Eyes:     General: No scleral icterus.    Extraocular Movements: Extraocular movements intact.     Conjunctiva/sclera: Conjunctivae normal.     Pupils: Pupils are equal, round, and reactive to light.  Cardiovascular:     Rate and Rhythm: Normal rate and regular rhythm.     Heart sounds: Normal heart sounds.  Pulmonary:     Effort: Pulmonary effort is normal.     Breath sounds: Rhonchi (LLL) present.  Abdominal:     General: Bowel sounds are normal. There is no distension.     Palpations: Abdomen is soft. There is no mass.     Tenderness: There is no abdominal tenderness. There is no right CVA tenderness, left CVA tenderness, guarding or rebound.     Hernia: No hernia is present.  Musculoskeletal:     Cervical back: Normal range of motion and neck supple. No rigidity or tenderness.  Lymphadenopathy:     Cervical: Cervical adenopathy present.  Skin:    General: Skin is  warm.     Coloration: Skin  is not jaundiced or pale.     Findings: No rash (NO current rash but she shows erythematic and slightly raised rash of chest).  Neurological:     Mental Status: She is alert and oriented to person, place, and time.     Cranial Nerves: No cranial nerve deficit.     Sensory: No sensory deficit.     Motor: No weakness.     Coordination: Coordination normal.     Gait: Gait normal.     Deep Tendon Reflexes: Reflexes normal.     UC Treatments / Results  Labs (all labs ordered are listed, but only abnormal results are displayed) Labs Reviewed  POCT URINALYSIS DIPSTICK, ED / UC - Abnormal; Notable for the following components:      Result Value   Hgb urine dipstick SMALL (*)    All other components within normal limits  SARS CORONAVIRUS 2 (TAT 6-24 HRS)  POC INFLUENZA A AND B ANTIGEN (URGENT CARE ONLY)    EKG   Radiology DG Chest 2 View  Result Date: 11/02/2020 CLINICAL DATA:  Cough and fever.  Rhonchi left lower lobe EXAM: CHEST - 2 VIEW COMPARISON:  07/14/2018 FINDINGS: The heart size and mediastinal contours are within normal limits. Both lungs are clear. The visualized skeletal structures are unremarkable. IMPRESSION: No active cardiopulmonary disease. Electronically Signed   By: Franchot Gallo M.D.   On: 11/02/2020 16:21    Procedures Procedures (including critical care time)  Medications Ordered in UC Medications - No data to display  Initial Impression / Assessment and Plan / UC Course  I have reviewed the triage vital signs and the nursing notes.  Pertinent labs & imaging results that were available during my care of the patient were reviewed by me and considered in my medical decision making (see chart for details).     New. Wide differential. Chest x ray and UA pending along with influenza and COVID-19 testing.   UPDATE: Urinalysis is unremarkable and her influenza testing is negative.  Her chest x-ray is clear.  Likely viral in nature  which I discussed with patient.  For now I am going to treat with low-dose Toradol along with Zofran to help with her current symptoms and send in a prescription for Tessalon, Flonase and Zofran.  Discussed red flag signs and symptoms.  Work note given.  Rest and hydration encouraged.   Final Clinical Impressions(s) / UC Diagnoses   Final diagnoses:  None   Discharge Instructions   None    ED Prescriptions   None    PDMP not reviewed this encounter.   Hughie Closs, [place] 11/02/20 1634

## 2020-11-03 LAB — SARS CORONAVIRUS 2 (TAT 6-24 HRS): SARS Coronavirus 2: NEGATIVE

## 2021-04-26 ENCOUNTER — Other Ambulatory Visit: Payer: Self-pay | Admitting: Nurse Practitioner

## 2021-04-26 DIAGNOSIS — E038 Other specified hypothyroidism: Secondary | ICD-10-CM

## 2021-04-26 DIAGNOSIS — E063 Autoimmune thyroiditis: Secondary | ICD-10-CM

## 2021-04-30 NOTE — Telephone Encounter (Signed)
I called patient and her husband but was not able to leave a message. Sent patient a Pharmacist, community. ?

## 2021-05-01 NOTE — Telephone Encounter (Signed)
Called both numbers again not able to leave a message. ?

## 2021-05-02 NOTE — Telephone Encounter (Signed)
Noted and letter mailed to the patient ?

## 2021-05-02 NOTE — Telephone Encounter (Signed)
Please send letter to patient's home stating that she needs to schedule a follow-up visit in order to receive additional refills. ? ?I sent a 30-day supply of her levothyroxine to the pharmacy stating that she needed an office visit for further refills. ?

## 2021-05-02 NOTE — Telephone Encounter (Signed)
I have not been able to reach patient, called both numbers and sent mychart message  ?

## 2021-05-15 ENCOUNTER — Ambulatory Visit (INDEPENDENT_AMBULATORY_CARE_PROVIDER_SITE_OTHER): Payer: Managed Care, Other (non HMO) | Admitting: Nurse Practitioner

## 2021-05-15 ENCOUNTER — Ambulatory Visit (INDEPENDENT_AMBULATORY_CARE_PROVIDER_SITE_OTHER)
Admission: RE | Admit: 2021-05-15 | Discharge: 2021-05-15 | Disposition: A | Discharge status: Home / Self Care | Payer: Managed Care, Other (non HMO) | Source: Ambulatory Visit | Attending: Nurse Practitioner | Admitting: Nurse Practitioner

## 2021-05-15 ENCOUNTER — Encounter: Payer: Self-pay | Admitting: Nurse Practitioner

## 2021-05-15 VITALS — BP 118/90 | HR 59 | Temp 97.8°F | Ht 61.0 in | Wt 169.2 lb

## 2021-05-15 DIAGNOSIS — M25522 Pain in left elbow: Secondary | ICD-10-CM

## 2021-05-15 DIAGNOSIS — E538 Deficiency of other specified B group vitamins: Secondary | ICD-10-CM | POA: Diagnosis not present

## 2021-05-15 DIAGNOSIS — Z1231 Encounter for screening mammogram for malignant neoplasm of breast: Secondary | ICD-10-CM

## 2021-05-15 DIAGNOSIS — Z9889 Other specified postprocedural states: Secondary | ICD-10-CM

## 2021-05-15 DIAGNOSIS — E038 Other specified hypothyroidism: Secondary | ICD-10-CM | POA: Diagnosis not present

## 2021-05-15 DIAGNOSIS — M545 Low back pain, unspecified: Secondary | ICD-10-CM | POA: Diagnosis not present

## 2021-05-15 DIAGNOSIS — J4540 Moderate persistent asthma, uncomplicated: Secondary | ICD-10-CM | POA: Diagnosis not present

## 2021-05-15 DIAGNOSIS — Z13820 Encounter for screening for osteoporosis: Secondary | ICD-10-CM

## 2021-05-15 DIAGNOSIS — E063 Autoimmune thyroiditis: Secondary | ICD-10-CM | POA: Diagnosis not present

## 2021-05-15 DIAGNOSIS — I83813 Varicose veins of bilateral lower extremities with pain: Secondary | ICD-10-CM | POA: Insufficient documentation

## 2021-05-15 DIAGNOSIS — Z0000 Encounter for general adult medical examination without abnormal findings: Secondary | ICD-10-CM | POA: Insufficient documentation

## 2021-05-15 LAB — T4, FREE: Free T4: 0.83 ng/dL (ref 0.60–1.60)

## 2021-05-15 LAB — COMPREHENSIVE METABOLIC PANEL
ALT: 46 U/L — ABNORMAL HIGH (ref 0–35)
AST: 24 U/L (ref 0–37)
Albumin: 4.7 g/dL (ref 3.5–5.2)
Alkaline Phosphatase: 64 U/L (ref 39–117)
BUN: 13 mg/dL (ref 6–23)
CO2: 30 mEq/L (ref 19–32)
Calcium: 9.7 mg/dL (ref 8.4–10.5)
Chloride: 103 mEq/L (ref 96–112)
Creatinine, Ser: 0.7 mg/dL (ref 0.40–1.20)
GFR: 93.92 mL/min (ref >60.00)
Glucose, Bld: 94 mg/dL (ref 70–99)
Potassium: 3.7 mEq/L (ref 3.5–5.1)
Sodium: 141 mEq/L (ref 135–145)
Total Bilirubin: 0.9 mg/dL (ref 0.2–1.2)
Total Protein: 7.6 g/dL (ref 6.0–8.3)

## 2021-05-15 LAB — CBC
HCT: 44.6 % (ref 36.0–46.0)
Hemoglobin: 14.8 g/dL (ref 12.0–15.0)
MCHC: 33.1 g/dL (ref 30.0–36.0)
MCV: 92.8 fl (ref 78.0–100.0)
Platelets: 231 10*3/uL (ref 150.0–400.0)
RBC: 4.81 Mil/uL (ref 3.87–5.11)
RDW: 13.7 % (ref 11.5–15.5)
WBC: 7.5 10*3/uL (ref 4.0–10.5)

## 2021-05-15 LAB — LIPID PANEL
Cholesterol: 233 mg/dL — ABNORMAL HIGH (ref 0–200)
HDL: 104.8 mg/dL (ref >39.00)
LDL Cholesterol: 117 mg/dL — ABNORMAL HIGH (ref 0–99)
NonHDL: 127.82
Total CHOL/HDL Ratio: 2
Triglycerides: 55 mg/dL (ref 0.0–149.0)
VLDL: 11 mg/dL (ref 0.0–40.0)

## 2021-05-15 LAB — TSH: TSH: 3.26 u[IU]/mL (ref 0.35–5.50)

## 2021-05-15 LAB — VITAMIN B12: Vitamin B-12: 551 pg/mL (ref 211–911)

## 2021-05-15 LAB — HEMOGLOBIN A1C: Hgb A1c MFr Bld: 5.6 % (ref 4.6–6.5)

## 2021-05-15 MED ORDER — CYCLOBENZAPRINE HCL 5 MG PO TABS: 5.0000 mg | ORAL_TABLET | Freq: Two times a day (BID) | ORAL | 0 refills | Status: AC | PRN

## 2021-05-15 NOTE — Patient Instructions (Signed)
Nice to see you today ?I will be in touch with the labs and xray once I have them ?I sent in a muscle relaxer, it may make you sleepy ?Follow up with me in 6 month for recheck, sooner if you need me ?

## 2021-05-15 NOTE — Assessment & Plan Note (Signed)
Patient having left-sided lumbar pain.  Has tried Flexeril with some benefit.  Did offer patient low-dose narcotic pain medications which she politely declined.  We will start Flexeril 5 mg twice daily as needed pain.  Pending lumbar picture.  Patient's left-sided straight leg raise was positive we could also consider giving a course of steroids if muscle relaxer ineffective and lumbar x-ray normal ?

## 2021-05-15 NOTE — Assessment & Plan Note (Signed)
Patient has rather large varicose veins on right lower extremity in front of her tibia that is causing discomfort.  We will refer her to vascular surgery.  Ambulatory referral placed today ?

## 2021-05-15 NOTE — Assessment & Plan Note (Signed)
Patient currently on B12 injections due to secondary absorption issue from gastric procedure.  Patient was given 1000 mcg weekly.  States she will need a rehab on the medication.  We will pend lab results today if B12 within normal limits refill on B12 injections. ?

## 2021-05-15 NOTE — Assessment & Plan Note (Signed)
Discussed age-appropriate immunizations and screening exams. 

## 2021-05-15 NOTE — Assessment & Plan Note (Signed)
Patient currently maintained on levothyroxine 125 mcg.  She is status post thyroidectomy.  Pending labs today ?

## 2021-05-15 NOTE — Assessment & Plan Note (Signed)
Last office visit did switch patient from Flovent to Qvar inhaler.  Patient states she did not get the Qvar inhaler as the pharmacy gave her the [place].  Did call pharmacy they do have Qvar inhaler prescription.  Price of inhalers $125 versus Flovent is $50.  Did call was unable to get in touch with patient in regards to price see if this was doable for her.  Did leave message with patient's husband verbally for her to call back to office to discuss.  Patient states that she also has not had good improvement with albuterol inhaler she reports the Ventolin brand name inhaler that seems to work better for patient.  Patient's asthma is currently un controlled ?

## 2021-05-15 NOTE — Progress Notes (Signed)
? ?Established Patient Office Visit ? ?Subjective   ?Patient ID: Debra Adkins, female    DOB: 1960/06/19  Age: 61 y.o. MRN: [id number] ? ?Chief Complaint  ?Patient presents with  ? Annual Exam  ?  Back leg elbow/whole arm   ? ? ?HPI ? ?for complete physical and follow up of chronic conditions. ? ?Immunizations: ?-Tetanus:within 10 ?-Influenza: UTD ?-Covid-19:no covid ?-Shingles: Information given ?-Pneumonia: Too young ? ?-HPV: Aged out ? ?Diet: Fair diet. 2 meals a day. Coffee water and wine. ?Exercise: No regular exercise.out mowing the yard ? ?Eye exam: bilateral cataract.  No regular follow-up ?Dental exam: needs updating ? ?Pap Smear: hysterectomy ?Mammogram: needs updating [facility] ?Colonoscopy: [facility]  colonoscpy 1-2 times a year. ?Dexa: DEXA needs updating a [facility] breast center ? ?Lung Cancer Screening: N/A ?Bed around 8-9 and around 5-530. Will get up around 6-630.  States as of late not sleeping well due to the pain she has been experiencing. ? ? 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 States that in October slid down steps and hit butt and arm. States that the back pain is gotten worse. States that states that she struck her left arm. Bone hurt (achy all the time) states that she has taken tylenol and aleve with some relief.  Patient has no history of DVT or PE. ? ?Back pain is described as  ?Hurts to move, change positions, has tried a muscle relaxer with help ? ?Varicose veins on the RLE extremty that is painful ? ?Asthma: Flovent not helping states she is using the albuterol but without relief. QVAR and ventolin ? ? ? ?Review of Systems  ?Constitutional:  Negative for chills and fever.  ?Respiratory:  Positive for shortness of breath. Negative for cough.   ?Cardiovascular:  Negative for chest pain and leg swelling.  ?Gastrointestinal:  Positive for nausea. Negative for abdominal pain and vomiting.  ?Musculoskeletal:  Positive for back pain and  joint pain.  ? ?  ?Objective:  ?  ? ?BP 118/90 (BP Location: Left Arm, Patient Position: Sitting, Cuff Size: Normal)   Pulse (!) 59   Temp 97.8 ?F (36.6 ?C) (Temporal)   Ht '5\' 1"'$  (1.549 m)   Wt 169 lb 3.2 oz (76.7 kg)   SpO2 100%   BMI 31.97 kg/m?  ? ? ?Physical Exam ?Vitals and nursing note reviewed.  ?Constitutional:   ?   Appearance: Normal appearance.  ?HENT:  ?   Right Ear: Tympanic membrane, ear canal and external ear normal.  ?   Left Ear: Tympanic membrane, ear canal and external ear normal.  ?   Mouth/Throat:  ?   Mouth: Mucous membranes are moist.  ?   Pharynx: Oropharynx is clear.  ?Cardiovascular:  ?   Rate and Rhythm: Normal rate and regular rhythm.  ?Pulmonary:  ?   Effort: Pulmonary effort is normal.  ?   Breath sounds: Normal breath sounds.  ?Abdominal:  ?   General: Bowel sounds are normal.  ?Musculoskeletal:  ?     Arms: ? ?   Lumbar back: Tenderness and bony tenderness present. Positive left straight leg raise test. Negative right straight leg raise test.  ?   Right lower leg: No edema.  ?   Left lower leg: No edema.  ?Lymphadenopathy:  ?   Cervical: No cervical adenopathy.  ?Neurological:  ?   General: No focal deficit present.  ?   Mental Status: She is alert.  ?  Psychiatric:     ?   Mood and Affect: Mood normal.     ?   Behavior: Behavior normal.     ?   Thought Content: Thought content normal.     ?   Judgment: Judgment normal.  ? ? ? ?Results for orders placed or performed in visit on 05/15/21  ?CBC  ?Result Value Ref Range  ? WBC 7.5 4.0 - 10.5 K/uL  ? RBC 4.81 3.87 - 5.11 Mil/uL  ? Platelets 231.0 150.0 - 400.0 K/uL  ? Hemoglobin 14.8 12.0 - 15.0 g/dL  ? HCT 44.6 36.0 - 46.0 %  ? MCV 92.8 78.0 - 100.0 fl  ? MCHC 33.1 30.0 - 36.0 g/dL  ? RDW 13.7 11.5 - 15.5 %  ?Comprehensive metabolic panel  ?Result Value Ref Range  ? Sodium 141 135 - 145 mEq/L  ? Potassium 3.7 3.5 - 5.1 mEq/L  ? Chloride 103 96 - 112 mEq/L  ? CO2 30 19 - 32 mEq/L  ? Glucose, Bld 94 70 - 99 mg/dL  ? BUN 13 6 - 23 mg/dL   ? Creatinine, Ser 0.70 0.40 - 1.20 mg/dL  ? Total Bilirubin 0.9 0.2 - 1.2 mg/dL  ? Alkaline Phosphatase 64 39 - 117 U/L  ? AST 24 0 - 37 U/L  ? ALT 46 (H) 0 - 35 U/L  ? Total Protein 7.6 6.0 - 8.3 g/dL  ? Albumin 4.7 3.5 - 5.2 g/dL  ? GFR 93.92 >60.00 mL/min  ? Calcium 9.7 8.4 - 10.5 mg/dL  ?Hemoglobin A1c  ?Result Value Ref Range  ? Hgb A1c MFr Bld 5.6 4.6 - 6.5 %  ?TSH  ?Result Value Ref Range  ? TSH 3.26 0.35 - 5.50 uIU/mL  ?T4, free  ?Result Value Ref Range  ? Free T4 0.83 0.60 - 1.60 ng/dL  ?Lipid panel  ?Result Value Ref Range  ? Cholesterol 233 (H) 0 - 200 mg/dL  ? Triglycerides 55.0 0.0 - 149.0 mg/dL  ? HDL 104.80 >39.00 mg/dL  ? VLDL 11.0 0.0 - 40.0 mg/dL  ? LDL Cholesterol 117 (H) 0 - 99 mg/dL  ? Total CHOL/HDL Ratio 2   ? NonHDL 127.82   ?Vitamin B12  ?Result Value Ref Range  ? Vitamin B-12 551 211 - 911 pg/mL  ? ? ? ? ?The ASCVD Risk score (Arnett DK, et al., 2019) failed to calculate for the following reasons: ?  The valid HDL cholesterol range is 20 to 100 mg/dL ? ?  ?Assessment & Plan:  ? ?Problem List Items Addressed This Visit   ? ?  ? Cardiovascular and Mediastinum  ? Varicose veins of both lower extremities with pain  ?  Patient has rather large varicose veins on right lower extremity in front of her tibia that is causing discomfort.  We will refer her to vascular surgery.  Ambulatory referral placed today ? ?  ?  ? Relevant Orders  ? Ambulatory referral to Vascular Surgery  ?  ? Respiratory  ? Moderate persistent asthma without complication  ?  Last office visit did switch patient from Flovent to Qvar inhaler.  Patient states she did not get the Qvar inhaler as the pharmacy gave her the [place].  Did call pharmacy they do have Qvar inhaler prescription.  Price of inhalers $125 versus Flovent is $50.  Did call was unable to get in touch with patient in regards to price see if this was doable for her.  Did leave message with patient's husband  verbally for her to call back to office to discuss.   Patient states that she also has not had good improvement with albuterol inhaler she reports the Ventolin brand name inhaler that seems to work better for patient.  Patient's asthma is currently un controlled ? ?  ?  ?  ? Endocrine  ? Hypothyroidism due to Hashimoto's thyroiditis  ?  Patient currently maintained on levothyroxine 125 mcg.  She is status post thyroidectomy.  Pending labs today ? ?  ?  ? Relevant Orders  ? TSH (Completed)  ? T4, free (Completed)  ?  ? Other  ? B12 deficiency  ?  Patient currently on B12 injections due to secondary absorption issue from gastric procedure.  Patient was given 1000 mcg weekly.  States she will need a rehab on the medication.  We will pend lab results today if B12 within normal limits refill on B12 injections. ? ?  ?  ? Relevant Orders  ? Vitamin B12 (Completed)  ? History of gastric surgery  ? Lumbar pain - Primary  ?  Patient having left-sided lumbar pain.  Has tried Flexeril with some benefit.  Did offer patient low-dose narcotic pain medications which she politely declined.  We will start Flexeril 5 mg twice daily as needed pain.  Pending lumbar picture.  Patient's left-sided straight leg raise was positive we could also consider giving a course of steroids if muscle relaxer ineffective and lumbar x-ray normal ? ?  ?  ? Relevant Medications  ? cyclobenzaprine (FLEXERIL) 5 MG tablet  ? Other Relevant Orders  ? DG Lumbar Spine Complete  ? Left elbow pain  ? Relevant Orders  ? DG Elbow Complete Left  ? Preventative health care  ?  Discussed age-appropriate immunizations and screening exams. ? ?  ?  ? Relevant Orders  ? CBC (Completed)  ? Comprehensive metabolic panel (Completed)  ? Hemoglobin A1c (Completed)  ? TSH (Completed)  ? Lipid panel (Completed)  ? ?Other Visit Diagnoses   ? ? Encounter for screening mammogram for malignant neoplasm of breast      ? Relevant Orders  ? MM Digital Screening  ? Screening for osteoporosis      ? Relevant Orders  ? DG Bone Density  ? ?   ? ? ?Return in about 6 months (around 11/14/2021) for recheck.  ? ? ?Romilda Garret, NP ? ?

## 2021-05-16 ENCOUNTER — Encounter: Payer: Self-pay | Admitting: Nurse Practitioner

## 2021-05-16 DIAGNOSIS — J4540 Moderate persistent asthma, uncomplicated: Secondary | ICD-10-CM

## 2021-05-16 MED ORDER — ALBUTEROL SULFATE HFA 108 (90 BASE) MCG/ACT IN AERS: 2.0000 | INHALATION_SPRAY | Freq: Four times a day (QID) | RESPIRATORY_TRACT | 1 refills | Status: DC | PRN

## 2021-05-20 ENCOUNTER — Ambulatory Visit: Payer: Managed Care, Other (non HMO) | Admitting: Nurse Practitioner

## 2021-06-01 ENCOUNTER — Other Ambulatory Visit: Payer: Self-pay | Admitting: Nurse Practitioner

## 2021-06-01 DIAGNOSIS — M545 Low back pain, unspecified: Secondary | ICD-10-CM

## 2021-06-05 ENCOUNTER — Ambulatory Visit: Payer: Managed Care, Other (non HMO) | Admitting: Nurse Practitioner

## 2021-06-05 ENCOUNTER — Other Ambulatory Visit: Payer: Self-pay | Admitting: Nurse Practitioner

## 2021-06-05 ENCOUNTER — Ambulatory Visit
Admission: RE | Admit: 2021-06-05 | Discharge: 2021-06-05 | Disposition: A | Discharge status: Home / Self Care | Payer: Managed Care, Other (non HMO) | Source: Ambulatory Visit | Attending: Nurse Practitioner | Admitting: Nurse Practitioner

## 2021-06-05 VITALS — BP 124/78 | HR 94 | Temp 97.5°F | Resp 14 | Ht 61.0 in | Wt 172.0 lb

## 2021-06-05 DIAGNOSIS — Z9889 Other specified postprocedural states: Secondary | ICD-10-CM | POA: Diagnosis not present

## 2021-06-05 DIAGNOSIS — E538 Deficiency of other specified B group vitamins: Secondary | ICD-10-CM | POA: Diagnosis not present

## 2021-06-05 DIAGNOSIS — M79602 Pain in left arm: Secondary | ICD-10-CM

## 2021-06-05 DIAGNOSIS — M545 Low back pain, unspecified: Secondary | ICD-10-CM | POA: Diagnosis not present

## 2021-06-05 DIAGNOSIS — I83813 Varicose veins of bilateral lower extremities with pain: Secondary | ICD-10-CM

## 2021-06-05 MED ORDER — CYANOCOBALAMIN 1000 MCG/ML IJ SOLN: 1000.0000 ug | INTRAMUSCULAR | 3 refills | Status: DC

## 2021-06-05 MED ORDER — "SYRINGE/NEEDLE (DISP) 25G X 1"" 1 ML MISC": 1 refills | Status: DC

## 2021-06-05 MED ORDER — CYCLOBENZAPRINE HCL 5 MG PO TABS: 5.0000 mg | ORAL_TABLET | Freq: Two times a day (BID) | ORAL | 0 refills | Status: AC | PRN

## 2021-06-05 MED ORDER — HYDROCODONE-ACETAMINOPHEN 5-325 MG PO TABS: ORAL_TABLET | ORAL | 0 refills | Status: DC

## 2021-06-05 NOTE — Assessment & Plan Note (Signed)
Patient still having persistent back pain.  She is curious about pursuing advanced imaging we will refer her to orthopedist after we get ultrasound of left upper extremity she would like both issues handled if possible ?

## 2021-06-05 NOTE — Patient Instructions (Signed)
Nice to see you today ?I will be in touch with the ultra sound results once I have them ?Call [facility]. [phone] ?

## 2021-06-05 NOTE — Assessment & Plan Note (Signed)
Patient requesting refill on B12 injections and supplies.  Refill sent in today last B12 level within normal limits ?

## 2021-06-05 NOTE — Assessment & Plan Note (Signed)
Stemming from her left elbow.  Has increased patient is having edema to the arm now along with warmth to touch we will order stat ultrasound of left upper extremity rule out DVT.  Pending result if negative refer to orthopedics. ?

## 2021-06-05 NOTE — Progress Notes (Signed)
? ?Established Patient Office Visit ? ?Subjective   ?Patient ID: Debra Adkins, female    DOB: 1960-05-11  Age: 61 y.o. MRN: [id number] ? ?Chief Complaint  ?Patient presents with  ? Go over labs and xray results  ?  Still having issue with left arm, pain is present.  ? ? ?HPI ? ?Left elbow pain: States that it started to get sore and then she fell on 10/2020. Having pain since that was uncomfortable with numby throbby and the pain. Now pain sharp stabbing and achy that is constant and not improving.  She has been history of DVT or PE per her report. ? ?Back pain: Patient has tried muscle relaxer with little benefit.  She is curious about advanced imaging.  We will refer her to orthopedist ? ?Lab results: Patient presents to clinic to go over lab results.  Did go over these via MyChart but patient states she does not like the back-and-forth.  Labs printed out and reviewed verbally with patient ? ? ?Review of Systems  ?Constitutional:  Negative for chills.  ?Respiratory:  Negative for shortness of breath.   ?Cardiovascular:  Negative for chest pain.  ?Musculoskeletal:  Positive for back pain, joint pain and myalgias.  ?Skin:   ?     Positive warmth over left upper arm  ?Neurological:  Positive for tingling.  ? ?  ?Objective:  ?  ? ?BP 124/78   Pulse 94   Temp (!) 97.5 ?F (36.4 ?C)   Resp 14   Ht '5\' 1"'$  (1.549 m)   Wt 172 lb (78 kg)   SpO2 98%   BMI 32.50 kg/m?  ? ? ?Physical Exam ?Constitutional:   ?   Appearance: Normal appearance.  ?Cardiovascular:  ?   Rate and Rhythm: Normal rate and regular rhythm.  ?   Pulses: Normal pulses.  ?Pulmonary:  ?   Effort: Pulmonary effort is normal.  ?   Breath sounds: Normal breath sounds.  ?Musculoskeletal:     ?   General: Swelling and tenderness present.  ?   Comments: Edema to the left upper extremity.  2+ radial pulses bilateral.  Patient does have warmth to palpation to distal upper arm posteriorly.  ?Skin: ?   Findings: No bruising, lesion or rash.  ?Neurological:  ?    General: No focal deficit present.  ?   Mental Status: She is alert.  ? ? ? ?No results found for any visits on 06/05/21. ? ? ? ?The ASCVD Risk score (Arnett DK, et al., 2019) failed to calculate for the following reasons: ?  The valid HDL cholesterol range is 20 to 100 mg/dL ? ?  ?Assessment & Plan:  ? ?Problem List Items Addressed This Visit   ? ?  ? Cardiovascular and Mediastinum  ? Varicose veins of both lower extremities with pain  ?  Patient states she not heard from vascular referral.  Looked in the chart they have tried to contact patient without success to give patient information for clinic to call to set up appointment. ? ?  ?  ?  ? Other  ? B12 deficiency  ?  Patient requesting refill on B12 injections and supplies.  Refill sent in today last B12 level within normal limits ? ?  ?  ? Relevant Medications  ? cyanocobalamin (,VITAMIN B-12,) 1000 MCG/ML injection  ? History of gastric surgery  ? Relevant Medications  ? cyanocobalamin (,VITAMIN B-12,) 1000 MCG/ML injection  ? Lumbar pain  ?  Patient still having  persistent back pain.  She is curious about pursuing advanced imaging we will refer her to orthopedist after we get ultrasound of left upper extremity she would like both issues handled if possible ? ?  ?  ? Relevant Medications  ? HYDROcodone-acetaminophen (NORCO/VICODIN) 5-325 MG tablet  ? cyclobenzaprine (FLEXERIL) 5 MG tablet  ? Left arm pain - Primary  ?  Stemming from her left elbow.  Has increased patient is having edema to the arm now along with warmth to touch we will order stat ultrasound of left upper extremity rule out DVT.  Pending result if negative refer to orthopedics. ? ?  ?  ? Relevant Medications  ? HYDROcodone-acetaminophen (NORCO/VICODIN) 5-325 MG tablet  ? Other Relevant Orders  ? US Venous Img Upper Uni Left (DVT)  ? ? ?No follow-ups on file.  ? ? ?Romilda Garret, NP ? ?

## 2021-06-05 NOTE — Assessment & Plan Note (Signed)
Patient states she not heard from vascular referral.  Looked in the chart they have tried to contact patient without success to give patient information for clinic to call to set up appointment. ?

## 2021-06-05 NOTE — Progress Notes (Signed)
Orders only

## 2021-06-07 ENCOUNTER — Other Ambulatory Visit: Payer: Self-pay

## 2021-06-07 ENCOUNTER — Other Ambulatory Visit: Payer: Managed Care, Other (non HMO)

## 2021-06-07 ENCOUNTER — Ambulatory Visit (INDEPENDENT_AMBULATORY_CARE_PROVIDER_SITE_OTHER): Payer: Managed Care, Other (non HMO)

## 2021-06-07 ENCOUNTER — Ambulatory Visit: Payer: Managed Care, Other (non HMO) | Admitting: Physician Assistant

## 2021-06-07 ENCOUNTER — Other Ambulatory Visit: Payer: Self-pay | Admitting: Nurse Practitioner

## 2021-06-07 DIAGNOSIS — G8929 Other chronic pain: Secondary | ICD-10-CM

## 2021-06-07 DIAGNOSIS — M545 Low back pain, unspecified: Secondary | ICD-10-CM

## 2021-06-07 DIAGNOSIS — M25522 Pain in left elbow: Secondary | ICD-10-CM | POA: Diagnosis not present

## 2021-06-07 MED ORDER — HYDROCODONE-ACETAMINOPHEN 10-325 MG PO TABS: 0.5000 | ORAL_TABLET | Freq: Three times a day (TID) | ORAL | 0 refills | Status: AC | PRN

## 2021-06-07 NOTE — Progress Notes (Signed)
Office Visit Note   Patient: Debra Adkins           Date of Birth: 08-05-1960           MRN: [id number] Visit Date: 06/07/2021              Requested by: Michela Pitcher, NP [place],  [place] [id number] PCP: Michela Pitcher, NP  Chief Complaint  Patient presents with   Lower Back - Pain   Left Elbow - Pain      HPI: Patient is a pleasant 61 year old woman who comes in with a chief complaint of low back pain and left elbow pain.  She has had a history of chronic back pain though this is gotten significantly worse recently.  She also is experiencing left elbow pain that shoots down into her wrist.  She said she has pain especially at night and has to hold her hand up.  She does get some tingling and numbing in her fingers.  She relates this to a fall that she took in October.  Assessment & Plan: Visit Diagnoses:  1. Chronic bilateral low back pain without sciatica   2. Pain in left elbow   3. Lumbar pain   4. Left elbow pain     Plan: The pain in her left elbow is consistent with some lateral epicondylitis.  We will give her a tennis strap and she is asking for an injection today and I will go forward with this.  I also think she may have a component of a nerve issue such as carpal or cubital tunnel syndrome.  We will get an electrodiagnostic studies of her left upper extremity.  She does take oral anti-inflammatories that she has been told by her oncologist not to do this because of her history of stomach cancer.  I did offer her a Toradol injection today to hopefully calm some of this down.  With regards to her back I am recommending physical therapy.  If this does not help resolve this in a month we could go forward with an MRI and possible epidural steroid injections  Follow-Up Instructions: No follow-ups on file.   Ortho Exam  Patient is alert, oriented, no adenopathy, well-dressed, normal affect, normal respiratory effort. Examination of her left elbow no  redness no swelling she has good range of motion.  She is focally tender which she describes the pain is deep over the lateral epicondyle.  She has good supination pronation but does have some sensation of shooting pain.  Her grip strength objectively today is good although she does feel like she drops things with her hand once in a while she has good opposition of her fingers.  With Tinel sign she says it just feels numb she does have a recreation of her symptoms with Phalen's testing. Lower back cannot palpate any deformity.  She has good strength with resisted dorsiflexion plantarflexion of her ankles flexion extension of her legs and flexion of her hips.  She does have slight reproduction of her pain with straight leg raising in the back.  Her sensation is intact  Imaging: No results found. No images are attached to the encounter.  Labs: Lab Results  Component Value Date   HGBA1C 5.6 05/15/2021     Lab Results  Component Value Date   ALBUMIN 4.7 05/15/2021   ALBUMIN 4.7 10/25/2020   ALBUMIN 4.3 10/07/2019    No results found for: MG Lab Results  Component Value  Date   VD25OH 26.22 (L) 10/07/2019   VD25OH 23.67 (L) 07/14/2018    No results found for: PREALBUMIN    Latest Ref Rng & Units 05/15/2021   12:19 PM 10/25/2020   12:22 PM 10/07/2019    8:39 AM  CBC EXTENDED  WBC 4.0 - 10.5 K/uL 7.5   7.2   7.3    RBC 3.87 - 5.11 Mil/uL 4.81   4.90   4.60    Hemoglobin 12.0 - 15.0 g/dL 14.8   15.3   14.6    HCT 36.0 - 46.0 % 44.6   45.5   42.6    Platelets 150.0 - 400.0 K/uL 231.0   226.0   207.0    NEUT# 1.4 - 7.7 K/uL  4.6   4.1    Lymph# 0.7 - 4.0 K/uL  1.8   2.4       There is no height or weight on file to calculate BMI.  Orders:  Orders Placed This Encounter  Procedures   XR Lumbar Spine 2-3 Views   XR Elbow Complete Left (3+View)   Ambulatory referral to Physical Medicine Rehab   Ambulatory referral to Physical Therapy   No orders of the defined types were placed  in this encounter.    Procedures: No procedures performed  Clinical Data: No additional findings.  ROS:  All other systems negative, except as noted in the HPI. Review of Systems  Objective: Vital Signs: There were no vitals taken for this visit.  Specialty Comments:  No specialty comments available.  PMFS History: Patient Active Problem List   Diagnosis Date Noted   Left arm pain 06/05/2021   Lumbar pain 05/15/2021   Left elbow pain 05/15/2021   Preventative health care 05/15/2021   Varicose veins of both lower extremities with pain 05/15/2021   Hiatal hernia 05/02/2020   History of malignant carcinoid tumor of stomach 05/02/2020   B12 deficiency 10/20/2019   History of gastric surgery 10/20/2019   Hypothyroidism due to Hashimoto's thyroiditis 10/20/2019   Moderate persistent asthma without complication 24/40/1027   COVID-19 03/17/2019   Past Medical History:  Diagnosis Date   Allergy    Asthma    Blood transfusion without reported diagnosis    Carcinoid tumor of colon    Followed by Dr. Karis Juba in [place]   Chicken pox    Constipation    Diverticulosis    Hypertension    Thyroid disease    hoshimotos's thyroiditis at age 69    Family History  Problem Relation Age of Onset   Heart disease Father    Hyperlipidemia Father    Other Father        Heart transplant   Diabetes Brother    Heart disease Brother    Testicular cancer Brother    Heart disease Brother    Heart attack Brother     Past Surgical History:  Procedure Laterality Date   ABDOMINAL HYSTERECTOMY     states took everything but cervix?   DG GALL BLADDER     LAPAROSCOPIC GASTRIC SLEEVE RESECTION     THYROIDECTOMY     Social History   Occupational History   Not on file  Tobacco Use   Smoking status: Former    Packs/day: 0.25    Years: 20.00    Pack years: 5.00    Types: Cigarettes    Quit date: 01/20/2005    Years since quitting: 16.3   Smokeless tobacco: Never  Vaping  Use  Vaping Use: Never used  Substance and Sexual Activity   Alcohol use: Yes    Comment: 2-4 glasses of wine, 8-10 in a week   Drug use: Never   Sexual activity: Not Currently

## 2021-06-18 ENCOUNTER — Ambulatory Visit
Admission: RE | Admit: 2021-06-18 | Discharge: 2021-06-18 | Disposition: A | Discharge status: Home / Self Care | Payer: Managed Care, Other (non HMO) | Source: Ambulatory Visit | Attending: Nurse Practitioner | Admitting: Nurse Practitioner

## 2021-06-18 DIAGNOSIS — Z1231 Encounter for screening mammogram for malignant neoplasm of breast: Secondary | ICD-10-CM

## 2021-06-18 DIAGNOSIS — Z13820 Encounter for screening for osteoporosis: Secondary | ICD-10-CM

## 2021-06-24 NOTE — Therapy (Addendum)
Debra Adkins EVALUATION/DISCHARGE   Patient Name: Debra Adkins MRN: [id number] DOB:1960-11-18, 61 y.o., female Today's Date: 08/29/2021  PHYSICAL THERAPY DISCHARGE SUMMARY  Visits from Start of Care: 1  Current functional level related to goals / functional outcomes: See evaluation   Remaining deficits: See evaluation   Education / Equipment: Starter HEP   Patient agrees to discharge. Patient goals were not met. Patient is being discharged due to  not returning since evaluation.      Past Medical History:  Diagnosis Date   Allergy    Asthma    Blood transfusion without reported diagnosis    Carcinoid tumor of colon    Followed by Dr. Karis Juba in [place]   Chicken pox    Constipation    Diverticulosis    Hypertension    Thyroid disease    hoshimotos's thyroiditis at age 50   Past Surgical History:  Procedure Laterality Date   ABDOMINAL HYSTERECTOMY     states took everything but cervix?   DG GALL BLADDER     LAPAROSCOPIC GASTRIC SLEEVE RESECTION     THYROIDECTOMY     Patient Active Problem List   Diagnosis Date Noted   Lateral epicondylitis, left elbow 08/22/2021   Chronic venous insufficiency 08/02/2021   Left arm pain 06/05/2021   Lumbar pain 05/15/2021   Left elbow pain 05/15/2021   Preventative health care 05/15/2021   Varicose veins of both lower extremities with pain 05/15/2021   Hiatal hernia 05/02/2020   History of malignant carcinoid tumor of stomach 05/02/2020   B12 deficiency 10/20/2019   History of gastric surgery 10/20/2019   Hypothyroidism due to Hashimoto's thyroiditis 10/20/2019   Moderate persistent asthma without complication 62/22/9798   COVID-19 03/17/2019    PCP: Michela Pitcher, NP  REFERRING PROVIDER: Bevely Palmer Persons, PA  REFERRING DIAG: M54.50,G89.29 (ICD-10-CM) - Chronic bilateral low back pain without sciatica   Rationale for Evaluation and Treatment Rehabilitation  THERAPY DIAG:   Abnormal posture - Plan: PT plan of care cert/re-cert  Difficulty in walking, not elsewhere classified - Plan: PT plan of care cert/re-cert  Muscle weakness (generalized) - Plan: PT plan of care cert/re-cert  Other low back pain - Plan: PT plan of care cert/re-cert  ONSET DATE: Chronic low back/SI pain with symptoms worsening after a fall in October 2022.  SUBJECTIVE:                                                                                                                                                                                           SUBJECTIVE STATEMENT: [name] has an 8-9 year history of back and  SI pain including a previous epidural.  Symptoms have been worsening over the past 18 months.  She fell down some steps in October and things have been really flared-up since then.    PERTINENT HISTORY:  Asthma, HTN, thyroid disease (Hoshimotos), L arm and elbow pain, hiatal hernai, vericose veins B LEs  PAIN:  Are you having pain? Yes: NPRS scale: 4-8/10 Pain location: Low back Pain description: Sharp, throbbing Aggravating factors: Sleeping, using the bathroom, sitting in a low chair, prolonged postures and bed mobility Relieving factors: Injections   PRECAUTIONS: Back  WEIGHT BEARING RESTRICTIONS No  FALLS:  Has patient fallen in last 6 months? 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  PATIENT SURVEYS:  FOTO 41 (Goal 53 in 12 visits)  SCREENING FOR RED FLAGS: Bowel or bladder incontinence:  No Spinal tumors: No Cauda equina syndrome: No Compression fracture: No Abdominal aneurysm: No  COGNITION:  Overall cognitive status: Within functional limits for tasks assessed     SENSATION: [name] denies peripheral pain or paresthesias  MUSCLE LENGTH: Hamstrings: Right 40 deg; Left 40 deg  POSTURE: rounded shoulders, forward head, and decreased lumbar lordosis  LUMBAR ROM:   Active  A/PROM  eval  Flexion   Extension 10  Right lateral flexion   Left lateral flexion   Right rotation   Left rotation    (Blank rows = not tested)  LOWER EXTREMITY ROM:     Passive  Right eval Left eval  Hip flexion 90 90  Hip extension    Hip abduction    Hip adduction    Hip internal rotation 10 10  Hip external rotation 24 25  Knee flexion    Knee extension    Ankle dorsiflexion    Ankle plantarflexion    Ankle inversion    Ankle eversion     (Blank rows = not tested)  LOWER EXTREMITY MMT:    MMT Right eval Left eval  Hip flexion    Hip extension    Hip abduction    Hip adduction    Hip internal rotation    Hip external rotation    Knee flexion    Knee extension    Ankle dorsiflexion    Ankle plantarflexion    Ankle inversion    Ankle eversion     (Blank rows = not tested)   TODAY'S TREATMENT  06/27/2021: Exercises Lumbar extension (hands on butt cheeks and hips forward) 10X 3 seconds Shoulder blade pinches 10X 5 seconds Heel to toe raises with transversus abdominus activation 10X 3 seconds Figure 4 stretch 4X 20 seconds B  Functional Activities: Review imaging and relate to spine model; discussed basic body mechanics (sitting posture, golfer's lift for caring for dogs, bed mobility); reviewed exam findings and day 1 HEP   PATIENT EDUCATION:  Education details: See above Person educated: Patient Education method: Explanation, Demonstration, Tactile cues, Verbal cues, and Handouts Education comprehension: verbalized understanding, returned demonstration,  verbal cues required, tactile cues required, and needs further education   HOME EXERCISE PROGRAM: Access Code:  [email or web address] URL: [email or web address] Date: 06/27/2021 Prepared by: Vista Mink  Exercises - Standing Lumbar Extension at [place] 5 x daily - 7 x weekly - 1 sets - 5 reps - 3 seconds hold - Standing Scapular Retraction  - 5 x daily - 7 x weekly - 1 sets - 5 reps - 5 second hold - Heel Toe Raises with Counter Support  - 2-3 x daily - 7 x weekly - 1 sets - 10 reps - 3 seconds hold - Supine Figure 4 Piriformis Stretch  - 2-3 x daily - 7 x weekly - 1 sets - 5 reps - 20 seconds hold  ASSESSMENT: Access Code: [email or web address] URL: [email or web address] Date: 06/27/2021 Prepared by: Vista Mink  Exercises - Standing Lumbar Extension at [place]  - 5 x daily - 7 x weekly - 1 sets - 5 reps - 3 seconds hold - Standing Scapular Retraction  - 5 x daily - 7 x weekly - 1 sets - 5 reps - 5 second hold - Heel Toe Raises with Counter Support  - 2-3 x daily - 7 x weekly - 1 sets - 10 reps - 3 seconds hold - Supine Figure 4 Piriformis Stretch  - 2-3 x daily - 7 x weekly - 1 sets - 4-5 reps - 20 seconds hold   CLINICAL IMPRESSION: Patient is a 61 y.o. female who was seen today for physical therapy evaluation and treatment for low back pain.    OBJECTIVE IMPAIRMENTS Abnormal gait, decreased activity tolerance, decreased endurance, decreased knowledge of condition, difficulty walking, decreased ROM, decreased strength, decreased safety awareness, impaired perceived functional ability, increased muscle spasms, impaired flexibility, improper body mechanics, postural dysfunction, and pain.   ACTIVITY LIMITATIONS lifting, bending, sitting, standing, sleeping, stairs, and bed mobility  PARTICIPATION LIMITATIONS: community activity, occupation, and working with her rescue dogs  PERSONAL FACTORS Asthma, HTN, thyroid disease (Hoshimotos), L arm and elbow pain,  hiatal hernai, vericose veins B LEs are also affecting patient's functional outcome.   REHAB POTENTIAL: Good  CLINICAL DECISION MAKING: Stable/uncomplicated  EVALUATION COMPLEXITY: Low   GOALS: Goals reviewed with patient? Yes  SHORT TERM GOALS: Target date: 09/26/2021  Falisa will be independent with her day 1 HEP. Baseline: Started 06/27/2021 Goal status: INITIAL  2.  Collie will be able to demonstrate improved sitting and standing posture along with correct bed mobility. Baseline: Impaired at evaluation 06/27/2021 Goal status: INITIAL  3.  Dinah will report less functional difficulty with working with her rescue dogs, demonstrating better body mechanics. Baseline: Started 06/27/2021 Goal status: INITIAL   LONG TERM GOALS: Target date: 10/24/2021  Improve FOTO to 53. Baseline: 41 Goal status: INITIAL  2.  Improve low back pain to consistently 0-3/10 on the Numeric Pain Rating Scale. Baseline: 4-8/10 Goal status: INITIAL  3.  Improve trunk/lumbar extension to 15-20 degrees. Baseline: 10 degrees Goal status: INITIAL  4.  Improve B hip flexibility for hip flexors to 100 degrees; hip ER to 40 degrees and hamstrings to 45 degrees. Baseline: 90, 25, 40 respectively Goal status: INITIAL  5.  Remmy will be independent with her long-term HEP at [place]. Baseline: Started 06/28/2019 Goal status: INITIAL   PLAN: PT FREQUENCY: 1-2x/week  PT DURATION: 8 weeks  PLANNED INTERVENTIONS: Therapeutic exercises, Therapeutic activity, Neuromuscular re-education, Gait training, Patient/Family education, Joint mobilization, Dry Needling, Cryotherapy, Traction, and Manual therapy.  PLAN FOR NEXT SESSION: Review HEP, progress low back and scapular strength, practical posture and body mechanics.  Supine 90/90 hamstrings  stretching and modified Thomas stretch.   Farley Ly, PT, MPT 08/29/2021, 2:26 PM

## 2021-06-27 ENCOUNTER — Encounter: Payer: Self-pay | Admitting: Rehabilitative and Restorative Service Providers"

## 2021-06-27 ENCOUNTER — Ambulatory Visit: Payer: Managed Care, Other (non HMO) | Admitting: Rehabilitative and Restorative Service Providers"

## 2021-06-27 DIAGNOSIS — R293 Abnormal posture: Secondary | ICD-10-CM | POA: Diagnosis not present

## 2021-06-27 DIAGNOSIS — M5459 Other low back pain: Secondary | ICD-10-CM | POA: Diagnosis not present

## 2021-06-27 DIAGNOSIS — R262 Difficulty in walking, not elsewhere classified: Secondary | ICD-10-CM | POA: Diagnosis not present

## 2021-06-27 DIAGNOSIS — M6281 Muscle weakness (generalized): Secondary | ICD-10-CM

## 2021-07-01 ENCOUNTER — Other Ambulatory Visit: Payer: Self-pay | Admitting: Nurse Practitioner

## 2021-07-01 DIAGNOSIS — M545 Low back pain, unspecified: Secondary | ICD-10-CM

## 2021-07-02 ENCOUNTER — Other Ambulatory Visit: Payer: Self-pay

## 2021-07-02 DIAGNOSIS — E038 Other specified hypothyroidism: Secondary | ICD-10-CM

## 2021-07-02 MED ORDER — LEVOTHYROXINE SODIUM 125 MCG PO TABS: ORAL_TABLET | ORAL | 1 refills | Status: DC

## 2021-07-02 NOTE — Telephone Encounter (Signed)
Patient states she does need a refill. Patient states she did see ortho and was given a steroid shot in her back and elbow and she was able to get relief for maybe 3 days. In the last 2 days or so symptoms have gotten worse again. Patient states she takes the Flexeril 1 tablet maybe 1 every other day if even that often. She takes this to get comfortable to sleep. Patient does have a follow up later in the month. She is not sure what else they can do. Patient is not able to take Aleve type of medications per her GI provider.

## 2021-07-02 NOTE — Telephone Encounter (Signed)
Patient states she needs a refill for Levothyroxine.

## 2021-07-03 MED ORDER — CYCLOBENZAPRINE HCL 5 MG PO TABS: ORAL_TABLET | ORAL | 0 refills | Status: DC

## 2021-07-03 NOTE — Addendum Note (Signed)
Addended by: Kris Mouton on: 07/03/2021 08:55 AM   Modules accepted: Orders

## 2021-07-10 ENCOUNTER — Ambulatory Visit: Payer: Managed Care, Other (non HMO) | Admitting: Orthopaedic Surgery

## 2021-07-11 ENCOUNTER — Encounter: Payer: Managed Care, Other (non HMO) | Admitting: Rehabilitative and Restorative Service Providers"

## 2021-07-12 ENCOUNTER — Encounter: Payer: Managed Care, Other (non HMO) | Admitting: Physical Medicine and Rehabilitation

## 2021-07-18 ENCOUNTER — Encounter: Payer: Managed Care, Other (non HMO) | Admitting: Rehabilitative and Restorative Service Providers"

## 2021-07-24 ENCOUNTER — Ambulatory Visit: Payer: Managed Care, Other (non HMO) | Admitting: Orthopaedic Surgery

## 2021-07-25 ENCOUNTER — Encounter: Payer: Managed Care, Other (non HMO) | Admitting: Rehabilitative and Restorative Service Providers"

## 2021-07-26 ENCOUNTER — Other Ambulatory Visit (INDEPENDENT_AMBULATORY_CARE_PROVIDER_SITE_OTHER): Payer: Self-pay | Admitting: Nurse Practitioner

## 2021-07-26 DIAGNOSIS — I83811 Varicose veins of right lower extremities with pain: Secondary | ICD-10-CM

## 2021-07-29 ENCOUNTER — Ambulatory Visit (INDEPENDENT_AMBULATORY_CARE_PROVIDER_SITE_OTHER): Payer: 59 | Admitting: Vascular Surgery

## 2021-07-29 ENCOUNTER — Ambulatory Visit (INDEPENDENT_AMBULATORY_CARE_PROVIDER_SITE_OTHER): Payer: 59

## 2021-07-29 ENCOUNTER — Encounter (INDEPENDENT_AMBULATORY_CARE_PROVIDER_SITE_OTHER): Payer: Self-pay | Admitting: Vascular Surgery

## 2021-07-29 VITALS — BP 168/84 | HR 76 | Resp 17 | Ht 60.0 in | Wt 172.0 lb

## 2021-07-29 DIAGNOSIS — I83811 Varicose veins of right lower extremities with pain: Secondary | ICD-10-CM

## 2021-07-29 DIAGNOSIS — E063 Autoimmune thyroiditis: Secondary | ICD-10-CM

## 2021-07-29 DIAGNOSIS — E038 Other specified hypothyroidism: Secondary | ICD-10-CM

## 2021-07-29 DIAGNOSIS — J4540 Moderate persistent asthma, uncomplicated: Secondary | ICD-10-CM

## 2021-07-29 DIAGNOSIS — I872 Venous insufficiency (chronic) (peripheral): Secondary | ICD-10-CM

## 2021-07-29 DIAGNOSIS — I83813 Varicose veins of bilateral lower extremities with pain: Secondary | ICD-10-CM

## 2021-08-01 ENCOUNTER — Encounter: Payer: Managed Care, Other (non HMO) | Admitting: Rehabilitative and Restorative Service Providers"

## 2021-08-02 ENCOUNTER — Encounter (INDEPENDENT_AMBULATORY_CARE_PROVIDER_SITE_OTHER): Payer: Self-pay | Admitting: Vascular Surgery

## 2021-08-02 DIAGNOSIS — I872 Venous insufficiency (chronic) (peripheral): Secondary | ICD-10-CM | POA: Insufficient documentation

## 2021-08-02 NOTE — Progress Notes (Signed)
MRN : [id number]  Debra Adkins is a 61 y.o. (1960/12/25) female who presents with chief complaint of varicose veins hurt.  History of Present Illness:   The patient is seen for evaluation of symptomatic varicose veins. The patient relates burning and stinging which worsened steadily throughout the course of the day, particularly with standing. The patient also notes an aching and throbbing pain over the varicosities, particularly with prolonged dependent positions. The symptoms are significantly improved with elevation.  The patient also notes that during hot weather the symptoms are greatly intensified. The patient states the pain from the varicose veins interferes with work, daily exercise, shopping and household maintenance. At this point, the symptoms are persistent and severe enough that they're having a negative impact on lifestyle and are interfering with daily activities.  There is no history of DVT, PE or superficial thrombophlebitis. There is no history of ulceration or hemorrhage. The patient denies a significant family history of varicose veins.  The patient has not worn graduated compression in the past. At the present time the patient has not been using over-the-counter analgesics. There is no history of prior surgical intervention or sclerotherapy.    Current Meds  Medication Sig   albuterol (VENTOLIN HFA) 108 (90 Base) MCG/ACT inhaler Inhale 2 puffs into the lungs every 6 (six) hours as needed for wheezing or shortness of breath.   beclomethasone (QVAR REDIHALER) 80 MCG/ACT inhaler Inhale 1 puff into the lungs 2 (two) times daily.   cyanocobalamin (,VITAMIN B-12,) 1000 MCG/ML injection Inject 1 mL (1,000 mcg total) into the skin every 7 (seven) days.   cyclobenzaprine (FLEXERIL) 5 MG tablet TAKE 1 TABLET BY MOUTH 2 TIMES DAILY AS NEEDED FOR UP TO 15 DAYS FOR MUSCLE SPASMS   levothyroxine (SYNTHROID) 125 MCG tablet Take 1 tablet by mouth every morning on an empty stomach  with water only.  No food or other medications for 30 minutes.   Pediatric Multiple Vitamins (FLINTSTONES MULTIVITAMIN PO) Take by mouth.   promethazine (PHENERGAN) 25 MG tablet Take 12.5-25 mg by mouth 3 (three) times daily as needed.   Syringe/Needle, Disp, 25G X 1" 1 ML MISC Use one syringe to administer B12 injection    Past Medical History:  Diagnosis Date   Allergy    Asthma    Blood transfusion without reported diagnosis    Carcinoid tumor of colon    Followed by Dr. Karis Juba in [place]   Chicken pox    Constipation    Diverticulosis    Hypertension    Thyroid disease    hoshimotos's thyroiditis at age 74    Past Surgical History:  Procedure Laterality Date   ABDOMINAL HYSTERECTOMY     states took everything but cervix?   DG GALL BLADDER     LAPAROSCOPIC GASTRIC SLEEVE RESECTION     THYROIDECTOMY      Social History Social History   Tobacco Use   Smoking status: Former    Packs/day: 0.25    Years: 20.00    Total pack years: 5.00    Types: Cigarettes    Quit date: 01/20/2005    Years since quitting: 16.5   Smokeless tobacco: Never  Vaping Use   Vaping Use: Never used  Substance Use Topics   Alcohol use: Yes    Comment: 2-4 glasses of wine, 8-10 in a week   Drug use: Never    Family History Family History  Problem Relation Age of Onset   Heart disease  Father    Hyperlipidemia Father    Other Father        Heart transplant   Diabetes Brother    Heart disease Brother    Testicular cancer Brother    Heart disease Brother    Heart attack Brother     Allergies  Allergen Reactions   Chocolate Hives and Itching   Codeine Rash    nausea     REVIEW OF SYSTEMS (Negative unless checked)  Constitutional: '[]'$ Weight loss  '[]'$ Fever  '[]'$ Chills Cardiac: '[]'$ Chest pain   '[]'$ Chest pressure   '[]'$ Palpitations   '[]'$ Shortness of breath when laying flat   '[]'$ Shortness of breath with exertion. Vascular:  '[]'$ Pain in legs with walking   '[x]'$ Pain in legs with standing   '[]'$ History of DVT   '[]'$ Phlebitis   '[]'$ Swelling in legs   '[x]'$ Varicose veins   '[]'$ Non-healing ulcers Pulmonary:   '[]'$ Uses home oxygen   '[]'$ Productive cough   '[]'$ Hemoptysis   '[]'$ Wheeze  '[]'$ COPD   '[]'$ Asthma Neurologic:  '[]'$ Dizziness   '[]'$ Seizures   '[]'$ History of stroke   '[]'$ History of TIA  '[]'$ Aphasia   '[]'$ Vissual changes   '[]'$ Weakness or numbness in arm   '[]'$ Weakness or numbness in leg Musculoskeletal:   '[]'$ Joint swelling   '[]'$ Joint pain   '[]'$ Low back pain Hematologic:  '[]'$ Easy bruising  '[]'$ Easy bleeding   '[]'$ Hypercoagulable state   '[]'$ Anemic Gastrointestinal:  '[]'$ Diarrhea   '[]'$ Vomiting  '[]'$ Gastroesophageal reflux/heartburn   '[]'$ Difficulty swallowing. Genitourinary:  '[]'$ Chronic kidney disease   '[]'$ Difficult urination  '[]'$ Frequent urination   '[]'$ Blood in urine Skin:  '[]'$ Rashes   '[]'$ Ulcers  Psychological:  '[]'$ History of anxiety   '[]'$  History of major depression.  Physical Examination  Vitals:   07/29/21 1153  BP: (!) 168/84  Pulse: 76  Resp: 17  Weight: 172 lb (78 kg)  Height: 5' (1.524 m)   Body mass index is 33.59 kg/m. Gen: WD/WN, NAD Head: [place]/AT, No temporalis wasting.  Ear/Nose/Throat: Hearing grossly intact, nares w/o erythema or drainage, pinna without lesions Eyes: PER, EOMI, sclera nonicteric.  Neck: Supple, no gross masses.  No JVD.  Pulmonary:  Good air movement, no audible wheezing, no use of accessory muscles.  Cardiac: RRR, precordium not hyperdynamic. Vascular:  Large varicosities present, greater than 10 mm right.  Veins are tender to palpation  Moderate venous stasis changes to the legs bilaterally.  Trace soft pitting edema  Vessel Right Left  Radial Palpable Palpable  Gastrointestinal: soft, non-distended. No guarding/no peritoneal signs.  Musculoskeletal: M/S 5/5 throughout.  No deformity.  Neurologic: CN 2-12 intact. Pain and light touch intact in extremities.  Symmetrical.  Speech is fluent. Motor exam as listed above. Psychiatric: Judgment intact, Mood & affect appropriate for pt's clinical  situation. Dermatologic: Venous rashes no ulcers noted.  No changes consistent with cellulitis. Lymph : No lichenification or skin changes of chronic lymphedema.  CBC Lab Results  Component Value Date   WBC 7.5 05/15/2021   HGB 14.8 05/15/2021   HCT 44.6 05/15/2021   MCV 92.8 05/15/2021   PLT 231.0 05/15/2021    BMET    Component Value Date/Time   NA 141 05/15/2021 1219   K 3.7 05/15/2021 1219   CL 103 05/15/2021 1219   CO2 30 05/15/2021 1219   GLUCOSE 94 05/15/2021 1219   BUN 13 05/15/2021 1219   CREATININE 0.70 05/15/2021 1219   CALCIUM 9.7 05/15/2021 1219   CrCl cannot be calculated (Patient's most recent lab result is older than the maximum 21 days allowed.).  COAG No results  found for: "INR", "PROTIME"  Radiology VAS [place] LOWER EXTREMITY VENOUS REFLUX  Result Date: 07/29/2021  Lower Venous Reflux Study Patient Name:  COLETTE DICAMILLO  Date of Exam:   07/29/2021 Medical Rec #: [id number]     Accession #:    [id number] Date of Birth: 1960-06-22     Patient Gender: F Patient Age:   62 years Exam Location:  [facility] Procedure:      VAS [place] LOWER EXTREMITY VENOUS REFLUX Referring Phys: Eulogio Ditch --------------------------------------------------------------------------------  Indications: Varicosities, and Pain.  Performing Technologist: Almira Coaster RVS  Examination Guidelines: A complete evaluation includes B-mode imaging, spectral Doppler, color Doppler, and power Doppler as needed of all accessible portions of each vessel. Bilateral testing is considered an integral part of a complete examination. Limited examinations for reoccurring indications may be performed as noted. The reflux portion of the exam is performed with the patient in reverse Trendelenburg. Significant venous reflux is defined as >500 ms in the superficial venous system, and >1 second in the deep venous system.  Venous Reflux Times +--------------+---------+------+-----------+------------+--------+  RIGHT         Reflux NoRefluxReflux TimeDiameter cmsComments                         Yes                                  +--------------+---------+------+-----------+------------+--------+ CFV           no                                             +--------------+---------+------+-----------+------------+--------+ FV prox       no                                             +--------------+---------+------+-----------+------------+--------+ FV mid        no                                             +--------------+---------+------+-----------+------------+--------+ FV dist       no                                             +--------------+---------+------+-----------+------------+--------+ Popliteal     no                                             +--------------+---------+------+-----------+------------+--------+ GSV at SFJ    no                            .62              +--------------+---------+------+-----------+------------+--------+ GSV prox thighno                            .  52              +--------------+---------+------+-----------+------------+--------+ GSV mid thigh no                            .36              +--------------+---------+------+-----------+------------+--------+ GSV dist thighno                            .26              +--------------+---------+------+-----------+------------+--------+ GSV at knee   no                            .23              +--------------+---------+------+-----------+------------+--------+ GSV prox calf no                            .49              +--------------+---------+------+-----------+------------+--------+ SSV Pop Fossa           yes    3752 ms      .26              +--------------+---------+------+-----------+------------+--------+ SSV prox calf           yes    4361 ms      .29               +--------------+---------+------+-----------+------------+--------+ SSV mid calf            yes    3785 ms      .24              +--------------+---------+------+-----------+------------+--------+   Summary: Right: - No evidence of deep vein thrombosis seen in the right lower extremity, from the common femoral through the popliteal veins. - No evidence of superficial venous thrombosis in the right lower extremity. - There is no evidence of venous reflux seen in the right lower extremity. - No evidence of superficial venous reflux seen in the right greater saphenous vein.  - Venous reflux is noted in the right short saphenous vein.  *See table(s) above for measurements and observations. Electronically signed by Hortencia Pilar MD on 07/29/2021 at 5:15:58 PM.    Final      Assessment/Plan 1. Varicose veins of right lower extremity with pain  Recommend:  The patient has large symptomatic varicose veins that are painful and associated with swelling.  I have had a long discussion with the patient regarding  varicose veins and why they cause symptoms.  Patient will begin wearing graduated compression stockings class 1 on a daily basis, beginning first thing in the morning and removing them in the evening. The patient is instructed specifically not to sleep in the stockings.    The patient  will also begin using over-the-counter analgesics such as Motrin 600 mg po TID to help control the symptoms.    In addition, behavioral modification including elevation during the day will be initiated.    Pending the results of these changes the  patient will be reevaluated in three months.     Duplex ultrasound of the right lower extremity shows reflux throughout the right great saphenous vein.   Further plans will be based on the ultrasound results and whether conservative therapies are successful  at eliminating the pain and swelling.   - VAS [place] LOWER EXTREMITY VENOUS REFLUX  2. Varicose veins of both  lower extremities with pain See #1  3. Chronic venous insufficiency No surgery or intervention at this point in time.    I have discussed with the patient venous insufficiency and why it  causes symptoms. I have discussed with the patient the chronic skin changes that accompany venous insufficiency and the long term sequela such as infection and ulceration.  Patient will begin wearing graduated compression stockings or compression wraps on a daily basis.  The patient will put the compression on first thing in the morning and removing them in the evening. The patient is instructed specifically not to sleep in the compression.    In addition, behavioral modification including several periods of elevation of the lower extremities during the day will be continued. I have demonstrated that proper elevation is a position with the ankles at heart level.  The patient is instructed to begin routine exercise, especially walking on a daily basis  Duplex ultrasound of the right lower extremity shows reflux throughout the right great saphenous vein.   Following the review of the ultrasound the patient will follow up in 2-3 months to reassess the degree of swelling and the control that graduated compression stockings or compression wraps  is offering.   At that time the patient can be assessed for a Lymph Pump depending on the effectiveness of conservative therapy and the control of the associated lymphedema.   4. Moderate persistent asthma without complication Continue pulmonary medications and aerosols as already ordered, these medications have been reviewed and there are no changes at this time.    5. Hypothyroidism due to Hashimoto's thyroiditis Continue hormone replacement as ordered and reviewed, no changes at this time     Hortencia Pilar, MD  08/02/2021 4:14 PM

## 2021-08-11 ENCOUNTER — Other Ambulatory Visit: Payer: Self-pay | Admitting: Nurse Practitioner

## 2021-08-22 ENCOUNTER — Encounter: Payer: Self-pay | Admitting: Physician Assistant

## 2021-08-22 ENCOUNTER — Ambulatory Visit: Payer: Managed Care, Other (non HMO) | Admitting: Orthopedic Surgery

## 2021-08-22 DIAGNOSIS — M7712 Lateral epicondylitis, left elbow: Secondary | ICD-10-CM

## 2021-08-22 MED ORDER — PREDNISONE 10 MG (21) PO TBPK: ORAL_TABLET | ORAL | 0 refills | Status: DC

## 2021-08-22 NOTE — Progress Notes (Signed)
Office Visit Note   Patient: Debra Adkins           Date of Birth: 08/17/1960           MRN: [id number] Visit Date: 08/22/2021              Requested by: Michela Pitcher, NP [address] [place],  [place] [address] PCP: Michela Pitcher, NP   Assessment & Plan: Visit Diagnoses:  1. Lateral epicondylitis, left elbow     Plan: Patient's history exam findings do seem consistent with left lateral epicondylitis.  We reviewed the nature of this diagnosis as well as its prognosis and both conservative and surgical treatment options.  Patient had a corticosteroid injection back in May which did provide a short period of relief.  She has an acute episode of pain and swelling over the last 1-1/2 weeks ago.  We will try a short steroid taper to try to get over this acute episode.  I will also refer her to occupational therapy  Follow-Up Instructions: No follow-ups on file.   Orders:  No orders of the defined types were placed in this encounter.  Meds ordered this encounter  Medications   predniSONE (STERAPRED UNI-PAK 21 TAB) 10 MG (21) TBPK tablet    Sig: Audria Nine, MD    Dispense:  1 each    Refill:  0      Procedures: No procedures performed   Clinical Data: No additional findings.   Subjective: Chief Complaint  Patient presents with   Left Elbow - Follow-up    This is a 61 year old female who presents with continued left lateral elbow pain.  This started around May or so.  She did have a ground-level fall in October with some pain in the elbow but this current episode started around May.  She describes pain in the lateral aspect of the elbow.  Pain is worse with lifting and fully straightening her arm.  She had a corticosteroid injection in the elbow around May with a few weeks of symptom improvement.  Her pain returned around 2 weeks ago.  She notes that her pain is worse now.  She is acute swelling at the lateral aspect of the elbow.  She cannot take an  anti-inflammatory medication secondary to history of stomach cancer.  She has never tried any therapy.    Review of Systems   Objective: Vital Signs: There were no vitals taken for this visit.  Physical Exam Constitutional:      Appearance: Normal appearance.  Cardiovascular:     Rate and Rhythm: Normal rate.     Pulses: Normal pulses.  Pulmonary:     Effort: Pulmonary effort is normal.  Skin:    General: Skin is warm and dry.     Capillary Refill: Capillary refill takes less than 2 seconds.  Neurological:     Mental Status: She is alert.     Right Hand Exam   Tenderness  Right hand tenderness location: TTP at lateral epicondyle with mild to moderate associated swelling.  Other  Erythema: absent Sensation: normal Pulse: present  Comments:  Unable to fully extend elbow and lacks approx 10 deg from full extension.  Reproduction of her pain w/ resisted wrist and middle finger extension with elbow extended.      Specialty Comments:  No specialty comments available.  Imaging: No results found.   PMFS History: Patient Active Problem List   Diagnosis Date Noted   Lateral epicondylitis, left  elbow 08/22/2021   Chronic venous insufficiency 08/02/2021   Left arm pain 06/05/2021   Lumbar pain 05/15/2021   Left elbow pain 05/15/2021   Preventative health care 05/15/2021   Varicose veins of both lower extremities with pain 05/15/2021   Hiatal hernia 05/02/2020   History of malignant carcinoid tumor of stomach 05/02/2020   B12 deficiency 10/20/2019   History of gastric surgery 10/20/2019   Hypothyroidism due to Hashimoto's thyroiditis 10/20/2019   Moderate persistent asthma without complication 41/63/8453   COVID-19 03/17/2019   Past Medical History:  Diagnosis Date   Allergy    Asthma    Blood transfusion without reported diagnosis    Carcinoid tumor of colon    Followed by Dr. Karis Juba in [place]   Chicken pox    Constipation    Diverticulosis     Hypertension    Thyroid disease    hoshimotos's thyroiditis at age 32    Family History  Problem Relation Age of Onset   Heart disease Father    Hyperlipidemia Father    Other Father        Heart transplant   Diabetes Brother    Heart disease Brother    Testicular cancer Brother    Heart disease Brother    Heart attack Brother     Past Surgical History:  Procedure Laterality Date   ABDOMINAL HYSTERECTOMY     states took everything but cervix?   DG GALL BLADDER     LAPAROSCOPIC GASTRIC SLEEVE RESECTION     THYROIDECTOMY     Social History   Occupational History   Not on file  Tobacco Use   Smoking status: Former    Packs/day: 0.25    Years: 20.00    Total pack years: 5.00    Types: Cigarettes    Quit date: 01/20/2005    Years since quitting: 16.5   Smokeless tobacco: Never  Vaping Use   Vaping Use: Never used  Substance and Sexual Activity   Alcohol use: Yes    Comment: 2-4 glasses of wine, 8-10 in a week   Drug use: Never   Sexual activity: Not Currently

## 2021-08-28 ENCOUNTER — Other Ambulatory Visit: Payer: Self-pay | Admitting: Nurse Practitioner

## 2021-08-28 ENCOUNTER — Encounter: Payer: Managed Care, Other (non HMO) | Admitting: Physical Medicine and Rehabilitation

## 2021-09-03 ENCOUNTER — Other Ambulatory Visit: Payer: Self-pay

## 2021-09-03 ENCOUNTER — Ambulatory Visit: Payer: Managed Care, Other (non HMO) | Admitting: Rehabilitative and Restorative Service Providers"

## 2021-09-03 ENCOUNTER — Encounter: Payer: Self-pay | Admitting: Rehabilitative and Restorative Service Providers"

## 2021-09-03 DIAGNOSIS — M25522 Pain in left elbow: Secondary | ICD-10-CM | POA: Diagnosis not present

## 2021-09-03 DIAGNOSIS — M25622 Stiffness of left elbow, not elsewhere classified: Secondary | ICD-10-CM | POA: Diagnosis not present

## 2021-09-03 DIAGNOSIS — M25632 Stiffness of left wrist, not elsewhere classified: Secondary | ICD-10-CM | POA: Diagnosis not present

## 2021-09-03 DIAGNOSIS — M6281 Muscle weakness (generalized): Secondary | ICD-10-CM

## 2021-09-03 NOTE — Therapy (Addendum)
OUTPATIENT OCCUPATIONAL THERAPY ORTHO EVALUATION & DISCHARGE  Patient Name: Debra Adkins MRN: [id number] DOB:October 13, 1960, 61 y.o., female Today's Date: 09/04/2021  PCP: Karl Ito, NP REFERRING PROVIDER: Sherilyn Cooter, MD   OT End of Session - 09/03/21 1022     Visit Number 1    Number of Visits 16    Date for OT Re-Evaluation 10/25/21    Authorization Type [employer]    OT Start Time 1022    OT Stop Time 1106    OT Time Calculation (min) 44 min    Activity Tolerance Patient tolerated treatment well;Patient limited by pain;Patient limited by fatigue    Behavior During Therapy [facility] for tasks assessed/performed;Anxious             Past Medical History:  Diagnosis Date   Allergy    Asthma    Blood transfusion without reported diagnosis    Carcinoid tumor of colon    Followed by Dr. Karis Juba in [place]   Chicken pox    Constipation    Diverticulosis    Hypertension    Thyroid disease    hoshimotos's thyroiditis at age 46   Past Surgical History:  Procedure Laterality Date   ABDOMINAL HYSTERECTOMY     states took everything but cervix?   DG GALL BLADDER     LAPAROSCOPIC GASTRIC SLEEVE RESECTION     THYROIDECTOMY     Patient Active Problem List   Diagnosis Date Noted   Lateral epicondylitis, left elbow 08/22/2021   Chronic venous insufficiency 08/02/2021   Left arm pain 06/05/2021   Lumbar pain 05/15/2021   Left elbow pain 05/15/2021   Preventative health care 05/15/2021   Varicose veins of both lower extremities with pain 05/15/2021   Hiatal hernia 05/02/2020   History of malignant carcinoid tumor of stomach 05/02/2020   B12 deficiency 10/20/2019   History of gastric surgery 10/20/2019   Hypothyroidism due to Hashimoto's thyroiditis 10/20/2019   Moderate persistent asthma without complication 50/09/3816   COVID-19 03/17/2019    ONSET DATE: ~1 year onset insidious plus a fall on L arm Oct 2022   REFERRING DIAG: M77.12 (ICD-10-CM) - Lateral  epicondylitis, left elbow  THERAPY DIAG:  Pain in left elbow - Plan: Ot plan of care cert/re-cert  Muscle weakness (generalized) - Plan: Ot plan of care cert/re-cert  Stiffness of left elbow, not elsewhere classified - Plan: Ot plan of care cert/re-cert  Stiffness of left wrist, not elsewhere classified - Plan: Ot plan of care cert/re-cert  Rationale for Evaluation and Treatment Rehabilitation  SUBJECTIVE:   SUBJECTIVE STATEMENT: 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 Now she presents swollen, painful, and difficult motion in entire left arm and poor strength and function with left arm as well.   PERTINENT HISTORY: she has active GI cancer that has been treated intermittently for 10 years.   PRECAUTIONS: Other: stomach cancer 10 years (in treatment PRN)   WEIGHT BEARING RESTRICTIONS No  PAIN:  Are you having pain? Yes Rating: 8.5/10 at rest, up to 10/10 in past week at worst when reaching out quickly   FALLS: Has patient fallen in last 6 months? Yes. Number of falls 1 in Oct 2022  LIVING ENVIRONMENT: Lives with:  lives with their spouse  PLOF: Independent  PATIENT GOALS to get "normal" use of left arm back without much pain.   OBJECTIVE:   HAND DOMINANCE: Right   ADLs: Overall ADLs: States decreased ability to grab, hold household objects, pain and inability to open containers, perform all FMS tasks, etc.    FUNCTIONAL OUTCOME MEASURES: Eval: Patient Specific Functional Scale: 2.3 (sleep, open a jar, lift grandchild)  UPPER EXTREMITY ROM    Eval: cannot make full fist with left hand- seems mostly limited at MCP Js. Lacks full opposition and is painful as well. Her shoulder wasn't specifically measured today,  but is overtly, observably tight, stiff and lacking full motion in flexion, abd, and rotations.   Active ROM Right eval Left eval  Elbow flexion  98  Elbow extension  (-30)  Wrist flexion 68 23  Wrist extension 70 20  Wrist ulnar deviation    Wrist radial deviation    Wrist pronation  90  Wrist supination  65  (Blank rows = not tested)   UPPER EXTREMITY MMT:     MMT Right eval Left eval  Shoulder flexion    Shoulder abduction    Shoulder adduction    Shoulder extension    Shoulder internal rotation    Shoulder external rotation    Middle trapezius    Lower trapezius    Elbow flexion  4-/5 pain   Elbow extension  3+/5 pain  Wrist flexion  4-/5 pain  Wrist extension  3+/5 painful  Wrist ulnar deviation    Wrist radial deviation    Wrist pronation    Wrist supination    (Blank rows = not tested)  HAND FUNCTION: Eval: weak and painful in left hand, too painful to attempt today Grip strength Right: TBD lbs, Left: TBD lbs   COORDINATION: Eval: grossly impaired in left arm through observation. Cannot move well at shoulder, elbow, forearm, wrist or hand at this point.  Box and Blocks Test: TBD Blocks today (TBD is WFL)  SENSATION: Eval: Light touch intact today, though very TTP and almost hypersensitive in areas/very guarded   EDEMA:   Eval:  Moderately swollen in left lateral elbow and some diffuse swelling distally into forearm and proximally into upper arm as well.  COGNITION: Overall cognitive status: WFL for evaluation today, though anxious   OBSERVATIONS:   Eval: She seems to have severe, chronic disuse of left UE since injury and now cannot make a full closed fist, has stiffness and problems traveling up to her shoulder and neck now as well. She is guarded in pain posture (90* elbow flexion, slight wrist ext, neutral wrist, tensed shoulder, etc.). Positive resisted wrist ext test, very TTP and sensitive to extensor wad and into tricep.    TODAY'S TREATMENT:   Eval: First, OT educates her and encourages her to start using left arm again for light, tolerable tasks and stop pain pattern positioning and guarding behaviors or she will continue to have worsening pain, motion, function, etc. She states understanding but is anxious and fearful to move and attempt relaxing arm down by her side.  Eval was somewhat complex due to chronic and widespread symptomology, so OT only had time to edu on one other HEP exercise: PROM wrist flexion to be done carefully 4-6 x day, 3-5x for 15-20 sec. She does this several times with OT, OT ensuring it is not painful to very, very minimally painful.  She is also edu on heat and ice modality proper use  and MH takes away some pain today. She is also edu on proper wear of counter-force brace that she states having and not wearing, leaving in car, etc. She states understanding need to use more frequently.    PATIENT EDUCATION: Education details: See tx section above for details  Person educated: Patient Education method: Verbal Instruction, Teach back, Handouts  Education comprehension: States and demonstrates understanding, Additional Education required    HOME EXERCISE PROGRAM: See tx section above for details   GOALS: Goals reviewed with patient? Yes   SHORT TERM GOALS: (STG required if POC>30 days)  Pt will obtain protective, custom orthotic. Target date: TBD, PRN Goal status: INITIAL  2.  Pt will demo/state understanding of initial HEP to improve pain levels and prerequisite motion. Target date: 09/13/21 Goal status: INITIAL   LONG TERM GOALS:  Pt will improve functional ability by decreased impairment per Quick DASH assessment from 2.3 to 7 or better, for better quality of life. Target date: 10/25/21 Goal status: INITIAL  2.  Pt will improve grip strength in left hand to at least 40lbs for functional use at home and in IADLs. Target date: 10/25/21 Goal status: INITIAL  3.  Pt will improve A/ROM in left  elbow flexion from 98* to at least 140*, to have functional motion for tasks like reach and grasp.  Target date: 10/25/21 Goal status: INITIAL  4.  Pt will improve strength in left wrist ext from painful 3+/5 MMT to at least 4+/5 MMT to have increased functional ability to carry out selfcare and higher-level homecare tasks with no difficulty. Target date: 10/25/21 Goal status: INITIAL  5.  Pt will decrease pain at worst from 10/10 to 3/10 or better to have better sleep and occupational participation in daily roles. Target date: 10/25/21 Goal status: INITIAL   ASSESSMENT:  CLINICAL IMPRESSION: Patient is a 61 y.o. female who was seen today for occupational therapy evaluation for left arm decreased function, stiffness, weakness, elbow pain and growing disability. She will benefit from OP OT to increase quality of life. She will likely need some form of orthotic to help rest wrist/elbow at night, especially if pain doesn't reduce drastically over next 1-2 weeks.    PERFORMANCE DEFICITS in functional skills including ADLs, IADLs, coordination, dexterity, edema, tone, ROM, strength, pain, fascial restrictions, muscle spasms, flexibility, GMC, body mechanics, endurance, and UE functional use, cognitive skills including problem solving and safety awareness, and psychosocial skills including coping strategies, environmental adaptation, habits, and routines and behaviors.   IMPAIRMENTS are limiting patient from ADLs, IADLs, rest and sleep, work, play, leisure, and social participation.   COMORBIDITIES has co-morbidities such as GI cancer, lumbago, Hashimoto's thyroiditis, and more   that affects occupational performance. Patient will benefit from skilled OT to address above impairments and improve overall function.  MODIFICATION OR ASSISTANCE TO COMPLETE EVALUATION: Min-Moderate modification of tasks or assist with assess necessary to complete an evaluation.  OT OCCUPATIONAL PROFILE AND HISTORY:  Detailed assessment: Review of records and additional review of physical, cognitive, psychosocial history related to current functional performance.  CLINICAL DECISION MAKING: High - multiple treatment options, significant modification of task necessary  REHAB POTENTIAL: Good  EVALUATION COMPLEXITY: Moderate      PLAN: OT FREQUENCY: 2x/week (tapering as appropriate)   OT DURATION: 8 weeks (through 10/25/21)   PLANNED INTERVENTIONS: self care/ADL training, therapeutic exercise, therapeutic activity, neuromuscular re-education, manual therapy, passive range of motion, splinting, electrical stimulation, ultrasound, compression bandaging, moist heat, cryotherapy, contrast bath, patient/family education, coping  strategies training, DME and/or AE instructions, and Re-evaluation  RECOMMENDED OTHER SERVICES: none now   CONSULTED AND AGREED WITH PLAN OF CARE: Patient  PLAN FOR NEXT SESSION: Consider wrist ext orthotic or elbow protection orthotic for night wear/to rest. Review HEP and initial recommendations, and focus on relaxation on pain relief, moving towards regaining motion and eventually strength when tolerated.   Benito Mccreedy, OTR/L, CHT 09/04/2021, 9:05 AM    OCCUPATIONAL THERAPY DISCHARGE SUMMARY  Visits from Start of Care: 0  She never returned to therapy after initial evaluation (for over a month) and she could not be contacted, did not have a voicemail setup.  She is D/C per policy now. Goals could not be addressed.   Benito Mccreedy, OTR/L, CHT 10/10/21

## 2021-09-11 ENCOUNTER — Ambulatory Visit: Payer: Self-pay | Admitting: Orthopaedic Surgery

## 2021-09-19 ENCOUNTER — Ambulatory Visit: Payer: Managed Care, Other (non HMO) | Admitting: Orthopaedic Surgery

## 2021-09-19 ENCOUNTER — Encounter: Payer: Self-pay | Admitting: Orthopaedic Surgery

## 2021-09-19 DIAGNOSIS — M7712 Lateral epicondylitis, left elbow: Secondary | ICD-10-CM

## 2021-09-19 MED ORDER — METHYLPREDNISOLONE ACETATE 40 MG/ML IJ SUSP
40.0000 mg | INTRAMUSCULAR | Status: AC | PRN
  Administered 2021-09-19: 40 mg via INTRA_ARTICULAR

## 2021-09-19 MED ORDER — LIDOCAINE HCL 1 % IJ SOLN
1.0000 mL | INTRAMUSCULAR | Status: AC | PRN
  Administered 2021-09-19: 1 mL

## 2021-09-19 NOTE — Progress Notes (Signed)
Office Visit Note   Patient: Debra Adkins           Date of Birth: 02/23/1960           MRN: [id number] Visit Date: 09/19/2021              Requested by: Michela Pitcher, NP [address] [place],  [place] [address] PCP: Michela Pitcher, NP   Assessment & Plan: Visit Diagnoses:  1. Lateral epicondylitis, left elbow     Plan: Patient is a pleasant 61 year old woman who is here in follow-up for her left elbow.  She was seen and evaluated 3 months ago and exam was consistent with lateral epicondylitis.  She went forward with an injection at that time and she says she did get good relief though admits she continued to be very active and probably did not let her elbow rest.  She had return of pain and followed up with Dr. Tempie Donning.  He gave her some oral steroids as a taper.  He then has sent her to occupational therapy.  She is not sure if this really helped that much she is doing home exercises at home.  She would like to try another injection today if this does not offer her good relief would recommend an MRI of her left elbow.  She may call if she wishes to go forward with this.  Follow-Up Instructions: Return if symptoms worsen or fail to improve.   Orders:  Orders Placed This Encounter  Procedures   Medium Joint Inj: L lateral epicondyle   No orders of the defined types were placed in this encounter.     Procedures: Medium Joint Inj: L lateral epicondyle on 09/19/2021 10:36 AM Indications: pain and diagnostic evaluation Details: 27 G 1.5 in needle, lateral approach Medications: 1 mL lidocaine 1 %; 40 mg methylPREDNISolone acetate 40 MG/ML Outcome: tolerated well, no immediate complications Procedure, treatment alternatives, risks and benefits explained, specific risks discussed. Consent was given by the patient.      Clinical Data: No additional findings.   Subjective: Chief Complaint  Patient presents with   Lower Back - Follow-up   Left Elbow - Follow-up    Patient presents today for follow up of her lower back and left elbow pain. Patient states that she received a elbow injection on 06/07/2021 which she feels like did help with her pain. She states that she was able to complete one therapy session and now has been completing them at home. Patient has recently noticed that she has been having increased swelling. She is currently not taking any medications for pain at this time.  Review of Systems  All other systems reviewed and are negative.    Objective: Vital Signs: There were no vitals taken for this visit.  Physical Exam Constitutional:      Appearance: Normal appearance.  Pulmonary:     Effort: Pulmonary effort is normal.  Skin:    General: Skin is warm and dry.  Neurological:     Mental Status: She is alert.     Ortho Exam Examination of her left elbow mild soft tissue swelling laterally but no effusion no erythema distal pulses are intact.  No paresthesias in her hand.  She is able to oppose all her fingers.  She does have a slight contracture approximately 5 degrees.  She has reproduction of her pain with resisted wrist and middle finger extension with elbow extended Specialty Comments:  No specialty comments available.  Imaging:  No results found.   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 Karis Juba in [place]   Chicken pox    Constipation    Diverticulosis    Hypertension    Thyroid disease    hoshimotos's thyroiditis at age 26    Family History  Problem Relation Age of Onset   Heart disease Father    Hyperlipidemia Father    Other Father        Heart transplant   Diabetes Brother    Heart disease Brother    Testicular cancer Brother    Heart disease Brother    Heart attack Brother     Past Surgical History:  Procedure Laterality Date   ABDOMINAL HYSTERECTOMY     states took everything but cervix?   DG GALL BLADDER     LAPAROSCOPIC GASTRIC SLEEVE RESECTION     THYROIDECTOMY     Social History   Occupational History   Not on file  Tobacco Use   Smoking status: Former    Packs/day: 0.25    Years: 20.00    Total pack years: 5.00    Types: Cigarettes    Quit date: 01/20/2005    Years since quitting: 16.6   Smokeless tobacco: Never  Vaping Use   Vaping Use: Never used  Substance and Sexual Activity   Alcohol use: Yes    Comment: 2-4 glasses of wine, 8-10 in a week   Drug use: Never   Sexual activity: Not Currently

## 2021-10-10 ENCOUNTER — Telehealth: Payer: Self-pay | Admitting: Rehabilitative and Restorative Service Providers"

## 2021-10-10 NOTE — Telephone Encounter (Signed)
OT attempted to call patient about why she has never come to therapy after initial evaluation. She has continued to seek care from her doctor, but did not follow through with any therapy or rest as was recommended.     She did not answer her phone and her VM box was full.   Since she has not come in for over a month, OT will discharge her per policies, but it would be advisable to restart therapy in the future, since she did not follow through at all during this POC, and it's no wonder that she is still having pain.

## 2021-10-14 ENCOUNTER — Encounter: Payer: Self-pay | Admitting: Nurse Practitioner

## 2021-10-14 NOTE — Telephone Encounter (Signed)
Does patient need to reach out to her orthopedic specialist?

## 2021-10-14 NOTE — Telephone Encounter (Signed)
Since she is being managed by ortho can we see if they will refill that for her

## 2021-10-28 ENCOUNTER — Ambulatory Visit (INDEPENDENT_AMBULATORY_CARE_PROVIDER_SITE_OTHER): Payer: Managed Care, Other (non HMO) | Admitting: Vascular Surgery

## 2021-10-28 ENCOUNTER — Encounter (INDEPENDENT_AMBULATORY_CARE_PROVIDER_SITE_OTHER): Payer: Self-pay | Admitting: Vascular Surgery

## 2021-10-28 VITALS — BP 147/97 | HR 73 | Resp 17 | Ht 60.0 in | Wt 174.0 lb

## 2021-10-28 DIAGNOSIS — I872 Venous insufficiency (chronic) (peripheral): Secondary | ICD-10-CM

## 2021-10-28 DIAGNOSIS — J4540 Moderate persistent asthma, uncomplicated: Secondary | ICD-10-CM

## 2021-10-28 DIAGNOSIS — I83813 Varicose veins of bilateral lower extremities with pain: Secondary | ICD-10-CM

## 2021-10-28 DIAGNOSIS — E063 Autoimmune thyroiditis: Secondary | ICD-10-CM

## 2021-10-28 DIAGNOSIS — E038 Other specified hypothyroidism: Secondary | ICD-10-CM

## 2021-10-28 NOTE — Progress Notes (Signed)
MRN : [id number]  Debra Adkins is a 61 y.o. (1960-12-21) female who presents with chief complaint of varicose veins hurt.  History of Present Illness:   The patient returns for followup evaluation 3 months after the initial visit. The patient continues to have pain in the lower extremities with dependency. The pain is lessened with elevation. Graduated compression stockings, Class I (20-30 mmHg), have been worn but the stockings do not eliminate the leg pain. Over-the-counter analgesics do not improve the symptoms. The degree of discomfort continues to interfere with daily activities. The patient notes the pain in the legs is causing problems with daily exercise, at the workplace and even with household activities and maintenance such as standing in the kitchen preparing meals and doing dishes.   Venous ultrasound shows normal deep venous system, no evidence of acute or chronic DVT.  Superficial reflux is present in the right small saphenous vein.  No outpatient medications have been marked as taking for the 10/28/21 encounter (Appointment) with Delana Meyer, Dolores Lory, MD.    Past Medical History:  Diagnosis Date   Allergy    Asthma    Blood transfusion without reported diagnosis    Carcinoid tumor of colon    Followed by Dr. Karis Juba in [place]   Chicken pox    Constipation    Diverticulosis    Hypertension    Thyroid disease    hoshimotos's thyroiditis at age 23    Past Surgical History:  Procedure Laterality Date   ABDOMINAL HYSTERECTOMY     states took everything but cervix?   DG GALL BLADDER     LAPAROSCOPIC GASTRIC SLEEVE RESECTION     THYROIDECTOMY      Social History Social History   Tobacco Use   Smoking status: Former    Packs/day: 0.25    Years: 20.00    Total pack years: 5.00    Types: Cigarettes    Quit date: 01/20/2005    Years since quitting: 16.7   Smokeless tobacco: Never  Vaping Use   Vaping Use: Never used  Substance Use Topics   Alcohol  use: Yes    Comment: 2-4 glasses of wine, 8-10 in a week   Drug use: Never    Family History Family History  Problem Relation Age of Onset   Heart disease Father    Hyperlipidemia Father    Other Father        Heart transplant   Diabetes Brother    Heart disease Brother    Testicular cancer Brother    Heart disease Brother    Heart attack Brother     Allergies  Allergen Reactions   Chocolate Hives and Itching   Codeine Rash    nausea     REVIEW OF SYSTEMS (Negative unless checked)  Constitutional: '[]'$ Weight loss  '[]'$ Fever  '[]'$ Chills Cardiac: '[]'$ Chest pain   '[]'$ Chest pressure   '[]'$ Palpitations   '[]'$ Shortness of breath when laying flat   '[]'$ Shortness of breath with exertion. Vascular:  '[]'$ Pain in legs with walking   '[x]'$ Pain in legs with standing  '[]'$ History of DVT   '[]'$ Phlebitis   '[]'$ Swelling in legs   '[x]'$ Varicose veins   '[]'$ Non-healing ulcers Pulmonary:   '[]'$ Uses home oxygen   '[]'$ Productive cough   '[]'$ Hemoptysis   '[]'$ Wheeze  '[]'$ COPD   '[]'$ Asthma Neurologic:  '[]'$ Dizziness   '[]'$ Seizures   '[]'$ History of stroke   '[]'$ History of TIA  '[]'$ Aphasia   '[]'$ Vissual changes   '[]'$ Weakness or numbness in arm   '[]'$   Weakness or numbness in leg Musculoskeletal:   '[]'$ Joint swelling   '[]'$ Joint pain   '[]'$ Low back pain Hematologic:  '[]'$ Easy bruising  '[]'$ Easy bleeding   '[]'$ Hypercoagulable state   '[]'$ Anemic Gastrointestinal:  '[]'$ Diarrhea   '[]'$ Vomiting  '[]'$ Gastroesophageal reflux/heartburn   '[]'$ Difficulty swallowing. Genitourinary:  '[]'$ Chronic kidney disease   '[]'$ Difficult urination  '[]'$ Frequent urination   '[]'$ Blood in urine Skin:  '[]'$ Rashes   '[]'$ Ulcers  Psychological:  '[]'$ History of anxiety   '[]'$  History of major depression.  Physical Examination  There were no vitals filed for this visit. There is no height or weight on file to calculate BMI. Gen: WD/WN, NAD Head: [place]/AT, No temporalis wasting.  Ear/Nose/Throat: Hearing grossly intact, nares w/o erythema or drainage, pinna without lesions Eyes: PER, EOMI, sclera nonicteric.  Neck: Supple, no  gross masses.  No JVD.  Pulmonary:  Good air movement, no audible wheezing, no use of accessory muscles.  Cardiac: RRR, precordium not hyperdynamic. Vascular:  Large varicosities present, greater than 10 mm right.  Veins are tender to palpation  Moderate venous stasis changes to the legs bilaterally.  Trace soft pitting edema  Vessel Right Left  Radial Palpable Palpable  Gastrointestinal: soft, non-distended. No guarding/no peritoneal signs.  Musculoskeletal: M/S 5/5 throughout.  No deformity.  Neurologic: CN 2-12 intact. Pain and light touch intact in extremities.  Symmetrical.  Speech is fluent. Motor exam as listed above. Psychiatric: Judgment intact, Mood & affect appropriate for pt's clinical situation. Dermatologic: Venous rashes no ulcers noted.  No changes consistent with cellulitis. Lymph : No lichenification or skin changes of chronic lymphedema.  CBC Lab Results  Component Value Date   WBC 7.5 05/15/2021   HGB 14.8 05/15/2021   HCT 44.6 05/15/2021   MCV 92.8 05/15/2021   PLT 231.0 05/15/2021    BMET    Component Value Date/Time   NA 141 05/15/2021 1219   K 3.7 05/15/2021 1219   CL 103 05/15/2021 1219   CO2 30 05/15/2021 1219   GLUCOSE 94 05/15/2021 1219   BUN 13 05/15/2021 1219   CREATININE 0.70 05/15/2021 1219   CALCIUM 9.7 05/15/2021 1219   CrCl cannot be calculated (Patient's most recent lab result is older than the maximum 21 days allowed.).  COAG No results found for: "INR", "PROTIME"  Radiology No results found.   Assessment/Plan 1. Varicose veins of both lower extremities with pain Recommend  I have reviewed my previous  discussion with the patient regarding  varicose veins and why they cause symptoms. Patient will continue  wearing graduated compression stockings class 1 on a daily basis, beginning first thing in the morning and removing them in the evening.    In addition, behavioral modification including elevation during the day was again  discussed and this will continue.  The patient has utilized over the counter pain medications and has been exercising.  However, at this time conservative therapy has not alleviated the patient's symptoms of leg pain and swelling  Recommend: laser ablation of the right small saphenous vein to eliminate the symptoms of pain and swelling of the lower extremities caused by the severe superficial venous reflux disease.    2. Chronic venous insufficiency No surgery or intervention at this point in time.    I have discussed with the patient venous insufficiency and why it  causes symptoms. I have discussed with the patient the chronic skin changes that accompany venous insufficiency and the long term sequela such as infection and ulceration.  Patient will begin wearing graduated compression stockings  or compression wraps on a daily basis.  The patient will put the compression on first thing in the morning and removing them in the evening. The patient is instructed specifically not to sleep in the compression.    In addition, behavioral modification including several periods of elevation of the lower extremities during the day will be continued. I have demonstrated that proper elevation is a position with the ankles at heart level.  The patient is instructed to begin routine exercise, especially walking on a daily basis  The patient will be assessed for a Lymph Pump depending on the effectiveness of conservative therapy and the control of the associated lymphedema.   3. Hypothyroidism due to Hashimoto's thyroiditis Continue hormone replacement as ordered and reviewed, no changes at this time   4. Moderate persistent asthma without complication Continue pulmonary medications and aerosols as already ordered, these medications have been reviewed and there are no changes at this time.      Hortencia Pilar, MD  10/28/2021 9:02 AM

## 2021-11-02 ENCOUNTER — Encounter (INDEPENDENT_AMBULATORY_CARE_PROVIDER_SITE_OTHER): Payer: Self-pay | Admitting: Vascular Surgery

## 2021-11-13 ENCOUNTER — Other Ambulatory Visit: Payer: Self-pay | Admitting: Nurse Practitioner

## 2021-11-18 ENCOUNTER — Encounter (INDEPENDENT_AMBULATORY_CARE_PROVIDER_SITE_OTHER): Payer: Self-pay

## 2021-11-25 ENCOUNTER — Telehealth: Payer: Self-pay | Admitting: Orthopaedic Surgery

## 2021-11-25 ENCOUNTER — Other Ambulatory Visit: Payer: Self-pay | Admitting: Physician Assistant

## 2021-11-25 DIAGNOSIS — M7712 Lateral epicondylitis, left elbow: Secondary | ICD-10-CM

## 2021-11-25 NOTE — Telephone Encounter (Signed)
Patient called in stating her elbow was not getting any better and Durward Fortes stated if it did not get better she would need a MRI, please place order

## 2021-12-11 ENCOUNTER — Ambulatory Visit
Admission: RE | Admit: 2021-12-11 | Discharge: 2021-12-11 | Disposition: A | Discharge status: Home / Self Care | Payer: Managed Care, Other (non HMO) | Source: Ambulatory Visit | Attending: Physician Assistant

## 2021-12-11 DIAGNOSIS — M7712 Lateral epicondylitis, left elbow: Secondary | ICD-10-CM

## 2021-12-17 ENCOUNTER — Ambulatory Visit: Payer: Managed Care, Other (non HMO) | Admitting: Orthopaedic Surgery

## 2021-12-17 ENCOUNTER — Encounter: Payer: Self-pay | Admitting: Orthopaedic Surgery

## 2021-12-17 DIAGNOSIS — M7712 Lateral epicondylitis, left elbow: Secondary | ICD-10-CM

## 2021-12-17 NOTE — Progress Notes (Signed)
Office Visit Note   Patient: Debra Adkins           Date of Birth: February 12, 1960           MRN: [id number] Visit Date: 12/17/2021              Requested by: Michela Pitcher, NP [address] [place],  [place] [address] PCP: Michela Pitcher, NP   Assessment & Plan: Visit Diagnoses:  1. Lateral epicondylitis, left elbow     Plan: Debra Adkins presents today for follow-up on an MRI that was done of her left elbow.  She has had a several month history of left elbow pain.  She has tried exercises on her own.  She has been diagnosed presumptively with lateral epicondylitis.  She did have 1 injection with lasted about a month or so.  Another injection which is only lasted a short time.  An MRI was ordered she is here to review this today.  MRI was consistent with mild edema and irregularity about the common extensor origin suggesting tendinosis or partial thickness tear consistent with tennis elbow.  She has failed injections.  We also offer her therapy.  She thinks this is a quality-of-life issue and would like to pursue surgical treatment.  She like to do this after the first of the year.  Will arrange for a surgical consultation with Dr. Erlinda Hong  Follow-Up Instructions: Return in about 1 week (around 12/24/2021), or With Dr. Erlinda Hong.   Orders:  No orders of the defined types were placed in this encounter.  No orders of the defined types were placed in this encounter.     Procedures: No procedures performed   Clinical Data: No additional findings.   Subjective: Chief Complaint  Patient presents with   Left Elbow - Pain, Follow-up    HPI Debra Adkins is here for MRI review for her left elbow.  She has had a chronic history of lateral epicondylitis.  It is now affecting her activities of daily living and is gotten little to no prolonged relief from injections  Review of Systems  All other systems reviewed and are negative.    Objective: Vital Signs: There were no vitals taken for this  visit.  Physical Exam Constitutional:      Appearance: Normal appearance.  Pulmonary:     Effort: Pulmonary effort is normal.  Neurological:     General: No focal deficit present.     Mental Status: She is alert.     Ortho Exam Left elbow no erythema no edema.  She is focally tender over the lateral epicondyle.  She is neurovascularly intact distally.  She is able to oppose all her fingers.  She does have reproduction of her  symptoms with resisted middle finger and wrist extension Specialty Comments:  No specialty comments available.  Imaging: No results found.   PMFS History: Patient Active Problem List   Diagnosis Date Noted   Lateral epicondylitis, left elbow 08/22/2021   Chronic venous insufficiency 08/02/2021   Left arm pain 06/05/2021   Lumbar pain 05/15/2021   Left elbow pain 05/15/2021   Preventative health care 05/15/2021   Varicose veins of both lower extremities with pain 05/15/2021   Hiatal hernia 05/02/2020   History of malignant carcinoid tumor of stomach 05/02/2020   B12 deficiency 10/20/2019   History of gastric surgery 10/20/2019   Hypothyroidism due to Hashimoto's thyroiditis 10/20/2019   Moderate persistent asthma without complication 90/24/0973   COVID-19 03/17/2019  Past Medical History:  Diagnosis Date   Allergy    Asthma    Blood transfusion without reported diagnosis    Carcinoid tumor of colon    Followed by Dr. Karis Juba in [place]   Chicken pox    Constipation    Diverticulosis    Hypertension    Thyroid disease    hoshimotos's thyroiditis at age 64    Family History  Problem Relation Age of Onset   Heart disease Father    Hyperlipidemia Father    Other Father        Heart transplant   Diabetes Brother    Heart disease Brother    Testicular cancer Brother    Heart disease Brother    Heart attack Brother     Past Surgical History:  Procedure Laterality Date   ABDOMINAL HYSTERECTOMY     states took everything but  cervix?   DG GALL BLADDER     LAPAROSCOPIC GASTRIC SLEEVE RESECTION     THYROIDECTOMY     Social History   Occupational History   Not on file  Tobacco Use   Smoking status: Former    Packs/day: 0.25    Years: 20.00    Total pack years: 5.00    Types: Cigarettes    Quit date: 01/20/2005    Years since quitting: 16.9   Smokeless tobacco: Never  Vaping Use   Vaping Use: Never used  Substance and Sexual Activity   Alcohol use: Yes    Comment: 2-4 glasses of wine, 8-10 in a week   Drug use: Never   Sexual activity: Not Currently

## 2021-12-20 ENCOUNTER — Ambulatory Visit: Payer: Managed Care, Other (non HMO) | Admitting: Orthopaedic Surgery

## 2021-12-20 DIAGNOSIS — M7712 Lateral epicondylitis, left elbow: Secondary | ICD-10-CM | POA: Diagnosis not present

## 2021-12-20 NOTE — Progress Notes (Unsigned)
Office Visit Note   Patient: Debra Adkins           Date of Birth: October 22, 1960           MRN: [id number] Visit Date: 12/20/2021              Requested by: Michela Pitcher, NP [address],  [place] [address] PCP: Michela Pitcher, NP   Assessment & Plan: Visit Diagnoses:  1. Lateral epicondylitis, left elbow     Plan: ***  Follow-Up Instructions: No follow-ups on file.   Orders:  No orders of the defined types were placed in this encounter.  No orders of the defined types were placed in this encounter.     Procedures: No procedures performed   Clinical Data: No additional findings.   Subjective: Chief Complaint  Patient presents with   Left Elbow - Follow-up    MRI review    HPI  Review of Systems  Constitutional: Negative.   HENT: Negative.    Eyes: Negative.   Respiratory: Negative.    Cardiovascular: Negative.   Endocrine: Negative.   Musculoskeletal: Negative.   Neurological: Negative.   Hematological: Negative.   Psychiatric/Behavioral: Negative.    All other systems reviewed and are negative.    Objective: Vital Signs: There were no vitals taken for this visit.  Physical Exam Vitals and nursing note reviewed.  Constitutional:      Appearance: She is well-developed.  HENT:     Head: Atraumatic.     Nose: Nose normal.  Eyes:     Extraocular Movements: Extraocular movements intact.  Cardiovascular:     Pulses: Normal pulses.  Pulmonary:     Effort: Pulmonary effort is normal.  Abdominal:     Palpations: Abdomen is soft.  Musculoskeletal:     Cervical back: Neck supple.  Skin:    General: Skin is warm.     Capillary Refill: Capillary refill takes less than 2 seconds.  Neurological:     Mental Status: She is alert. Mental status is at baseline.  Psychiatric:        Behavior: Behavior normal.        Thought Content: Thought content normal.        Judgment: Judgment normal.     Ortho Exam  Specialty Comments:  No  specialty comments available.  Imaging: No results found.   PMFS History: Patient Active Problem List   Diagnosis Date Noted   Lateral epicondylitis, left elbow 08/22/2021   Chronic venous insufficiency 08/02/2021   Left arm pain 06/05/2021   Lumbar pain 05/15/2021   Left elbow pain 05/15/2021   Preventative health care 05/15/2021   Varicose veins of both lower extremities with pain 05/15/2021   Hiatal hernia 05/02/2020   History of malignant carcinoid tumor of stomach 05/02/2020   B12 deficiency 10/20/2019   History of gastric surgery 10/20/2019   Hypothyroidism due to Hashimoto's thyroiditis 10/20/2019   Moderate persistent asthma without complication 93/23/5573   COVID-19 03/17/2019   Past Medical History:  Diagnosis Date   Allergy    Asthma    Blood transfusion without reported diagnosis    Carcinoid tumor of colon    Followed by Dr. Karis Juba in [place]   Chicken pox    Constipation    Diverticulosis    Hypertension    Thyroid disease    hoshimotos's thyroiditis at age 64    Family History  Problem Relation Age of Onset   Heart disease  Father    Hyperlipidemia Father    Other Father        Heart transplant   Diabetes Brother    Heart disease Brother    Testicular cancer Brother    Heart disease Brother    Heart attack Brother     Past Surgical History:  Procedure Laterality Date   ABDOMINAL HYSTERECTOMY     states took everything but cervix?   DG GALL BLADDER     LAPAROSCOPIC GASTRIC SLEEVE RESECTION     THYROIDECTOMY     Social History   Occupational History   Not on file  Tobacco Use   Smoking status: Former    Packs/day: 0.25    Years: 20.00    Total pack years: 5.00    Types: Cigarettes    Quit date: 01/20/2005    Years since quitting: 16.9   Smokeless tobacco: Never  Vaping Use   Vaping Use: Never used  Substance and Sexual Activity   Alcohol use: Yes    Comment: 2-4 glasses of wine, 8-10 in a week   Drug use: Never   Sexual  activity: Not Currently

## 2022-01-16 ENCOUNTER — Encounter (HOSPITAL_BASED_OUTPATIENT_CLINIC_OR_DEPARTMENT_OTHER): Payer: Self-pay | Admitting: Orthopaedic Surgery

## 2022-01-16 ENCOUNTER — Other Ambulatory Visit: Payer: Self-pay

## 2022-01-17 NOTE — Progress Notes (Signed)

## 2022-01-23 NOTE — Anesthesia Preprocedure Evaluation (Addendum)
Anesthesia Evaluation  Patient identified by MRN, date of birth, ID band Patient awake    Reviewed: Allergy & Precautions, NPO status , Patient's Chart, lab work & pertinent test results  Airway Mallampati: II  TM Distance: >3 FB Neck ROM: Full    Dental  (+) Dental Advisory Given, Teeth Intact, Missing,    Pulmonary asthma , former smoker   Pulmonary exam normal breath sounds clear to auscultation       Cardiovascular hypertension, Normal cardiovascular exam Rhythm:Regular Rate:Normal     Neuro/Psych    GI/Hepatic hiatal hernia,,,  Endo/Other  Hypothyroidism    Renal/GU      Musculoskeletal  (+) Arthritis ,    Abdominal  (+) + obese  Peds  Hematology   Anesthesia Other Findings   Reproductive/Obstetrics                             Anesthesia Physical Anesthesia Plan  ASA: 2  Anesthesia Plan: Regional   Post-op Pain Management: Tylenol PO (pre-op)*   Induction: Intravenous  PONV Risk Score and Plan: 2 and Ondansetron, Dexamethasone, Propofol infusion, Midazolam and Treatment may vary due to age or medical condition  Airway Management Planned: Natural Airway  Additional Equipment:   Intra-op Plan:   Post-operative Plan:   Informed Consent: I have reviewed the patients History and Physical, chart, labs and discussed the procedure including the risks, benefits and alternatives for the proposed anesthesia with the patient or authorized representative who has indicated his/her understanding and acceptance.     Dental advisory given  Plan Discussed with: CRNA  Anesthesia Plan Comments: (Some nebulous history of MI/CAD ">10 yrs ago". Per patient, had cath and echo, no stent and cardiologist reported normal heart function. No issues with CP, SOB, DOE, or other anginal equivalent. )        Anesthesia Quick Evaluation

## 2022-01-24 ENCOUNTER — Other Ambulatory Visit: Payer: Self-pay

## 2022-01-24 ENCOUNTER — Ambulatory Visit (HOSPITAL_BASED_OUTPATIENT_CLINIC_OR_DEPARTMENT_OTHER): Payer: Managed Care, Other (non HMO) | Admitting: Anesthesiology

## 2022-01-24 ENCOUNTER — Encounter (HOSPITAL_BASED_OUTPATIENT_CLINIC_OR_DEPARTMENT_OTHER): Payer: Self-pay | Admitting: Orthopaedic Surgery

## 2022-01-24 ENCOUNTER — Encounter (HOSPITAL_BASED_OUTPATIENT_CLINIC_OR_DEPARTMENT_OTHER)
Admission: RE | Disposition: A | Discharge status: Home / Self Care | Payer: Self-pay | Source: Home / Self Care | Attending: Orthopaedic Surgery

## 2022-01-24 ENCOUNTER — Ambulatory Visit (HOSPITAL_BASED_OUTPATIENT_CLINIC_OR_DEPARTMENT_OTHER)
Admission: RE | Admit: 2022-01-24 | Discharge: 2022-01-24 | Disposition: A | Discharge status: Home / Self Care | Payer: Managed Care, Other (non HMO) | Attending: Orthopaedic Surgery | Admitting: Orthopaedic Surgery

## 2022-01-24 DIAGNOSIS — Z87891 Personal history of nicotine dependence: Secondary | ICD-10-CM | POA: Diagnosis not present

## 2022-01-24 DIAGNOSIS — M1990 Unspecified osteoarthritis, unspecified site: Secondary | ICD-10-CM | POA: Insufficient documentation

## 2022-01-24 DIAGNOSIS — I10 Essential (primary) hypertension: Secondary | ICD-10-CM | POA: Diagnosis not present

## 2022-01-24 DIAGNOSIS — M7712 Lateral epicondylitis, left elbow: Secondary | ICD-10-CM

## 2022-01-24 DIAGNOSIS — Z01818 Encounter for other preprocedural examination: Secondary | ICD-10-CM

## 2022-01-24 DIAGNOSIS — E063 Autoimmune thyroiditis: Secondary | ICD-10-CM | POA: Insufficient documentation

## 2022-01-24 DIAGNOSIS — Z8249 Family history of ischemic heart disease and other diseases of the circulatory system: Secondary | ICD-10-CM | POA: Diagnosis not present

## 2022-01-24 DIAGNOSIS — J45909 Unspecified asthma, uncomplicated: Secondary | ICD-10-CM | POA: Diagnosis not present

## 2022-01-24 DIAGNOSIS — K449 Diaphragmatic hernia without obstruction or gangrene: Secondary | ICD-10-CM | POA: Diagnosis not present

## 2022-01-24 HISTORY — PX: TENNIS ELBOW RELEASE/NIRSCHEL PROCEDURE: SHX6651

## 2022-01-24 SURGERY — TENNIS ELBOW RELEASE/NIRSCHEL PROCEDURE
Anesthesia: Regional | Site: Elbow | Laterality: Left

## 2022-01-24 MED ORDER — FENTANYL CITRATE (PF) 100 MCG/2ML IJ SOLN
INTRAMUSCULAR | Status: AC
  Filled 2022-01-24: qty 2

## 2022-01-24 MED ORDER — LACTATED RINGERS IV SOLN: INTRAVENOUS | Status: DC

## 2022-01-24 MED ORDER — ONDANSETRON HCL 4 MG/2ML IJ SOLN
INTRAMUSCULAR | Status: DC | PRN
  Administered 2022-01-24: 4 mg via INTRAVENOUS

## 2022-01-24 MED ORDER — PROPOFOL 10 MG/ML IV BOLUS
INTRAVENOUS | Status: DC | PRN
  Administered 2022-01-24: 20 mg via INTRAVENOUS
  Administered 2022-01-24: 30 mg via INTRAVENOUS

## 2022-01-24 MED ORDER — PROPOFOL 500 MG/50ML IV EMUL
INTRAVENOUS | Status: DC | PRN
  Administered 2022-01-24: 100 ug/kg/min via INTRAVENOUS

## 2022-01-24 MED ORDER — LIDOCAINE HCL (CARDIAC) PF 100 MG/5ML IV SOSY
PREFILLED_SYRINGE | INTRAVENOUS | Status: DC | PRN
  Administered 2022-01-24: 20 mg via INTRAVENOUS

## 2022-01-24 MED ORDER — FENTANYL CITRATE (PF) 100 MCG/2ML IJ SOLN: 25.0000 ug | INTRAMUSCULAR | Status: DC | PRN

## 2022-01-24 MED ORDER — ACETAMINOPHEN 500 MG PO TABS
ORAL_TABLET | ORAL | Status: AC
  Filled 2022-01-24: qty 2

## 2022-01-24 MED ORDER — BUPIVACAINE-EPINEPHRINE (PF) 0.5% -1:200000 IJ SOLN
INTRAMUSCULAR | Status: DC | PRN
  Administered 2022-01-24: 17 mL

## 2022-01-24 MED ORDER — PHENYLEPHRINE HCL (PRESSORS) 10 MG/ML IV SOLN
INTRAVENOUS | Status: DC | PRN
  Administered 2022-01-24: 80 ug via INTRAVENOUS

## 2022-01-24 MED ORDER — ROPIVACAINE HCL 5 MG/ML IJ SOLN
INTRAMUSCULAR | Status: DC | PRN
  Administered 2022-01-24: 30 mL via PERINEURAL

## 2022-01-24 MED ORDER — CEFAZOLIN SODIUM-DEXTROSE 2-4 GM/100ML-% IV SOLN
2.0000 g | INTRAVENOUS | Status: AC
  Administered 2022-01-24: 2 g via INTRAVENOUS

## 2022-01-24 MED ORDER — CEFAZOLIN SODIUM-DEXTROSE 2-4 GM/100ML-% IV SOLN
INTRAVENOUS | Status: AC
  Filled 2022-01-24: qty 100

## 2022-01-24 MED ORDER — ONDANSETRON HCL 4 MG/2ML IJ SOLN
INTRAMUSCULAR | Status: AC
  Filled 2022-01-24: qty 2

## 2022-01-24 MED ORDER — MIDAZOLAM HCL 2 MG/2ML IJ SOLN
2.0000 mg | Freq: Once | INTRAMUSCULAR | Status: AC
  Administered 2022-01-24: 1 mg via INTRAVENOUS

## 2022-01-24 MED ORDER — MIDAZOLAM HCL 2 MG/2ML IJ SOLN
INTRAMUSCULAR | Status: AC
  Filled 2022-01-24: qty 2

## 2022-01-24 MED ORDER — ACETAMINOPHEN 500 MG PO TABS
1000.0000 mg | ORAL_TABLET | Freq: Once | ORAL | Status: AC
  Administered 2022-01-24: 1000 mg via ORAL

## 2022-01-24 MED ORDER — 0.9 % SODIUM CHLORIDE (POUR BTL) OPTIME
TOPICAL | Status: DC | PRN
  Administered 2022-01-24: 1000 mL

## 2022-01-24 MED ORDER — FENTANYL CITRATE (PF) 100 MCG/2ML IJ SOLN
100.0000 ug | Freq: Once | INTRAMUSCULAR | Status: AC
  Administered 2022-01-24: 50 ug via INTRAVENOUS

## 2022-01-24 MED ORDER — PROPOFOL 500 MG/50ML IV EMUL
INTRAVENOUS | Status: AC
  Filled 2022-01-24: qty 50

## 2022-01-24 MED ORDER — DEXAMETHASONE SODIUM PHOSPHATE 4 MG/ML IJ SOLN
INTRAMUSCULAR | Status: DC | PRN
  Administered 2022-01-24: 5 mg via PERINEURAL

## 2022-01-24 MED ORDER — CLONIDINE HCL (ANALGESIA) 100 MCG/ML EP SOLN
EPIDURAL | Status: DC | PRN
  Administered 2022-01-24: 80 ug

## 2022-01-24 MED ORDER — HYDROCODONE-ACETAMINOPHEN 5-325 MG PO TABS: 1.0000 | ORAL_TABLET | Freq: Four times a day (QID) | ORAL | 0 refills | Status: DC | PRN

## 2022-01-24 MED ORDER — BUPIVACAINE-EPINEPHRINE (PF) 0.5% -1:200000 IJ SOLN
INTRAMUSCULAR | Status: AC
  Filled 2022-01-24: qty 30

## 2022-01-24 MED ORDER — PROMETHAZINE HCL 25 MG/ML IJ SOLN: 6.2500 mg | INTRAMUSCULAR | Status: DC | PRN

## 2022-01-24 SURGICAL SUPPLY — 60 items
BLADE SURG 15 STRL LF DISP TIS (BLADE) ×1 IMPLANT
BLADE SURG 15 STRL SS (BLADE) ×1
BNDG CMPR 9X4 STRL LF SNTH (GAUZE/BANDAGES/DRESSINGS) ×1
BNDG ELASTIC 3X5.8 VLCR STR LF (GAUZE/BANDAGES/DRESSINGS) ×1 IMPLANT
BNDG ESMARK 4X9 LF (GAUZE/BANDAGES/DRESSINGS) ×1 IMPLANT
BRUSH SCRUB EZ PLAIN DRY (MISCELLANEOUS) ×1 IMPLANT
CORD BIPOLAR FORCEPS 12FT (ELECTRODE) ×1 IMPLANT
COVER BACK TABLE 60X90IN (DRAPES) ×1 IMPLANT
COVER MAYO STAND STRL (DRAPES) ×1 IMPLANT
CUFF TOURN SGL QUICK 18X3 (MISCELLANEOUS) ×2 IMPLANT
CUFF TOURN SGL QUICK 18X4 (TOURNIQUET CUFF) IMPLANT
DRAPE EXTREMITY T 121X128X90 (DISPOSABLE) ×1 IMPLANT
DRAPE SURG 17X23 STRL (DRAPES) ×1 IMPLANT
GAUZE SPONGE 4X4 12PLY STRL (GAUZE/BANDAGES/DRESSINGS) ×1 IMPLANT
GAUZE XEROFORM 1X8 LF (GAUZE/BANDAGES/DRESSINGS) ×1 IMPLANT
GLOVE ECLIPSE 7.0 STRL STRAW (GLOVE) ×1 IMPLANT
GLOVE INDICATOR 7.0 STRL GRN (GLOVE) ×1 IMPLANT
GLOVE INDICATOR 7.5 STRL GRN (GLOVE) ×1 IMPLANT
GLOVE SURG SYN 7.5  E (GLOVE) ×1
GLOVE SURG SYN 7.5 E (GLOVE) ×1 IMPLANT
GLOVE SURG SYN 7.5 PF PI (GLOVE) ×1 IMPLANT
GOWN STRL REUS W/ TWL LRG LVL3 (GOWN DISPOSABLE) ×1 IMPLANT
GOWN STRL REUS W/ TWL XL LVL3 (GOWN DISPOSABLE) ×1 IMPLANT
GOWN STRL REUS W/TWL LRG LVL3 (GOWN DISPOSABLE) ×1
GOWN STRL REUS W/TWL XL LVL3 (GOWN DISPOSABLE) ×1
GOWN STRL SURGICAL XL XLNG (GOWN DISPOSABLE) ×1 IMPLANT
LOOP VESSEL MAXI BLUE (MISCELLANEOUS) IMPLANT
NDL HYPO 25X1 1.5 SAFETY (NEEDLE) IMPLANT
NEEDLE HYPO 25X1 1.5 SAFETY (NEEDLE) ×1 IMPLANT
NS IRRIG 1000ML POUR BTL (IV SOLUTION) ×1 IMPLANT
PACK BASIN DAY SURGERY FS (CUSTOM PROCEDURE TRAY) ×1 IMPLANT
PAD CAST 3X4 CTTN HI CHSV (CAST SUPPLIES) ×1 IMPLANT
PADDING CAST ABS COTTON 4X4 ST (CAST SUPPLIES) IMPLANT
PADDING CAST COTTON 3X4 STRL (CAST SUPPLIES) ×1
PADDING CAST SYNTHETIC 3X4 NS (CAST SUPPLIES) IMPLANT
PADDING CAST SYNTHETIC 4X4 STR (CAST SUPPLIES) IMPLANT
SHEET MEDIUM DRAPE 40X70 STRL (DRAPES) ×1 IMPLANT
SLEEVE SCD COMPRESS KNEE MED (STOCKING) ×1 IMPLANT
SLING ARM FOAM STRAP LRG (SOFTGOODS) ×1 IMPLANT
SLING ARM FOAM STRAP MED (SOFTGOODS) IMPLANT
SPIKE FLUID TRANSFER (MISCELLANEOUS) IMPLANT
SPLINT FIBERGLASS 3X35 (CAST SUPPLIES) IMPLANT
SPLINT PLASTER CAST XFAST 3X15 (CAST SUPPLIES) IMPLANT
STOCKINETTE 4X48 STRL (DRAPES) ×1 IMPLANT
STRIP CLOSURE SKIN 1/4X4 (GAUZE/BANDAGES/DRESSINGS) IMPLANT
SUT ETHILON 4 0 PS 2 18 (SUTURE) ×1 IMPLANT
SUT MNCRL AB 4-0 PS2 18 (SUTURE) IMPLANT
SUT VIC AB 2-0 CT1 27 (SUTURE)
SUT VIC AB 2-0 CT1 TAPERPNT 27 (SUTURE) IMPLANT
SUT VIC AB 3-0 SH 27 (SUTURE) ×1
SUT VIC AB 3-0 SH 27X BRD (SUTURE) IMPLANT
SUT VIC AB 4-0 P-3 18XBRD (SUTURE) IMPLANT
SUT VIC AB 4-0 P2 18 (SUTURE) IMPLANT
SUT VIC AB 4-0 P3 18 (SUTURE)
SUT VICRYL 0 CT-2 (SUTURE) IMPLANT
SYR BULB EAR ULCER 3OZ GRN STR (SYRINGE) ×1 IMPLANT
SYR CONTROL 10ML LL (SYRINGE) IMPLANT
TOWEL GREEN STERILE FF (TOWEL DISPOSABLE) ×2 IMPLANT
TRAY DSU PREP LF (CUSTOM PROCEDURE TRAY) ×1 IMPLANT
UNDERPAD 30X36 HEAVY ABSORB (UNDERPADS AND DIAPERS) ×1 IMPLANT

## 2022-01-24 NOTE — Anesthesia Procedure Notes (Signed)
Anesthesia Regional Block: Axillary brachial plexus block   Pre-Anesthetic Checklist: , timeout performed,  Correct Patient, Correct Site, Correct Laterality,  Correct Procedure, Correct Position, site marked,  Risks and benefits discussed,  Surgical consent,  Pre-op evaluation,  At surgeon's request and post-op pain management  Laterality: Upper and Left  Prep: chloraprep       Needles:  Injection technique: Single-shot  Needle Type: Stimiplex          Additional Needles:   Procedures:,,,, ultrasound used (permanent image in chart),,    Narrative:  Start time: 01/24/2022 6:55 AM End time: 01/24/2022 7:15 AM Injection made incrementally with aspirations every 5 mL.  Performed by: Personally  Anesthesiologist: Nolon Nations, MD  Additional Notes: BP cuff, SpO2 and EKG monitors applied. Sedation begun. Nerve location verified with ultrasound. Anesthetic injected incrementally, slowly, and after neg aspirations under direct u/s guidance. Good perineural spread. Tolerated well.

## 2022-01-24 NOTE — Discharge Instructions (Addendum)
Postoperative instructions:  Dressing instructions: Keep your dressing and/or splint clean and dry at all times.  It will be removed at your first post-operative appointment.  Your stitches and/or staples will be removed at this visit.  Incision instructions:  Do not soak your incision for 3 weeks after surgery.  If the incision gets wet, pat dry and do not scrub the incision.  Pain control:  You have been given a prescription to be taken as directed for post-operative pain control.  In addition, elevate the operative extremity above the heart at all times to prevent swelling and throbbing pain.  Take over-the-counter Colace, '100mg'$  by mouth twice a day while taking narcotic pain medications to help prevent constipation.  Follow up appointments: 1) 7 days for suture removal and wound check. 2) Dr. Erlinda Hong as scheduled.   -------------------------------------------------------------------------------------------------------------  After Surgery Pain Control:  After your surgery, post-surgical discomfort or pain is likely. This discomfort can last several days to a few weeks. At certain times of the day your discomfort may be more intense.  Did you receive a nerve block?  A nerve block can provide pain relief for one hour to two days after your surgery. As long as the nerve block is working, you will experience little or no sensation in the area the surgeon operated on.  As the nerve block wears off, you will begin to experience pain or discomfort. It is very important that you begin taking your prescribed pain medication before the nerve block fully wears off. Treating your pain at the first sign of the block wearing off will ensure your pain is better controlled and more tolerable when full-sensation returns. Do not wait until the pain is intolerable, as the medicine will be less effective. It is better to treat pain in advance than to try and catch up.  General Anesthesia:  If you did not  receive a nerve block during your surgery, you will need to start taking your pain medication shortly after your surgery and should continue to do so as prescribed by your surgeon.  Pain Medication:  Most commonly we prescribe Vicodin and Percocet for post-operative pain. Both of these medications contain a combination of acetaminophen (Tylenol) and a narcotic to help control pain.   It takes between 30 and 45 minutes before pain medication starts to work. It is important to take your medication before your pain level gets too intense.   Nausea is a common side effect of many pain medications. You will want to eat something before taking your pain medicine to help prevent nausea.   If you are taking a prescription pain medication that contains acetaminophen, we recommend that you do not take additional over the counter acetaminophen (Tylenol).  Other pain relieving options:   Using a cold pack to ice the affected area a few times a day (15 to 20 minutes at a time) can help to relieve pain, reduce swelling and bruising.   Elevation of the affected area can also help to reduce pain and swelling.    Post Anesthesia Home Care Instructions  Activity: Get plenty of rest for the remainder of the day. A responsible individual must stay with you for 24 hours following the procedure.  For the next 24 hours, DO NOT: -Drive a car -Paediatric nurse -Drink alcoholic beverages -Take any medication unless instructed by your physician -Make any legal decisions or sign important papers.  Meals: Start with liquid foods such as gelatin or soup. Progress to regular foods  as tolerated. Avoid greasy, spicy, heavy foods. If nausea and/or vomiting occur, drink only clear liquids until the nausea and/or vomiting subsides. Call your physician if vomiting continues.  Special Instructions/Symptoms: Your throat may feel dry or sore from the anesthesia or the breathing tube placed in your throat during surgery.  If this causes discomfort, gargle with warm salt water. The discomfort should disappear within 24 hours.     Regional Anesthesia Blocks  1. Numbness or the inability to move the "blocked" extremity may last from 3-48 hours after placement. The length of time depends on the medication injected and your individual response to the medication. If the numbness is not going away after 48 hours, call your surgeon.  2. The extremity that is blocked will need to be protected until the numbness is gone and the  Strength has returned. Because you cannot feel it, you will need to take extra care to avoid injury. Because it may be weak, you may have difficulty moving it or using it. You may not know what position it is in without looking at it while the block is in effect.  3. For blocks in the legs and feet, returning to weight bearing and walking needs to be done carefully. You will need to wait until the numbness is entirely gone and the strength has returned. You should be able to move your leg and foot normally before you try and bear weight or walk. You will need someone to be with you when you first try to ensure you do not fall and possibly risk injury.  4. Bruising and tenderness at the needle site are common side effects and will resolve in a few days.  5. Persistent numbness or new problems with movement should be communicated to the surgeon or the [facility] [phone] [place] [phone]).  May take Tylenol after 12:30pm if needed.

## 2022-01-24 NOTE — Transfer of Care (Signed)
Immediate Anesthesia Transfer of Care Note  Patient: Ricarda Atayde  Procedure(s) Performed: LEFT LATERAL EPICONDYLE DEBRIDEMENT, REPAIR AS INIDICATED (Left: Elbow)  Patient Location: PACU  Anesthesia Type:MAC combined with regional for post-op pain  Level of Consciousness: awake, alert , and oriented  Airway & Oxygen Therapy: Patient Spontanous Breathing  Post-op Assessment: Report given to RN and Post -op Vital signs reviewed and stable  Post vital signs: Reviewed and stable  Last Vitals:  Vitals Value Taken Time  BP 103/64 01/24/22 0824  Temp    Pulse 64 01/24/22 0825  Resp 15 01/24/22 0825  SpO2 94 % 01/24/22 0825  Vitals shown include unvalidated device data.  Last Pain:  Vitals:   01/24/22 0617  TempSrc: Oral  PainSc: 0-No pain      Patients Stated Pain Goal: 3 (60/04/59 9774)  Complications: No notable events documented.

## 2022-01-24 NOTE — Op Note (Signed)
   Date of Surgery: 01/24/2022  INDICATIONS: Debra Adkins is a 62 y.o.-year-old female with a left elbow lateral epicondylitis that has failed conservative management.  The patient did consent to the procedure after discussion of the risks and benefits.  PREOPERATIVE DIAGNOSIS: Recalcitrant left elbow lateral epicondylitis  POSTOPERATIVE DIAGNOSIS: Same.  PROCEDURE: Left tennis elbow release of ECRB and repair of common extensor tendon  SURGEON: N. Eduard Roux, M.D.  ASSIST: Ciro Backer [place], [place]; necessary for the timely completion of procedure and due to complexity of procedure.  ANESTHESIA: Axillary and local  IV FLUIDS AND URINE: See anesthesia.  ESTIMATED BLOOD LOSS: Minimal mL.  IMPLANTS: None  DRAINS: None  COMPLICATIONS: see description of procedure.  DESCRIPTION OF PROCEDURE: The patient was brought to the operating room.  The patient had been signed prior to the procedure and this was documented. The patient had the anesthesia placed by the anesthesiologist.  A time-out was performed to confirm that this was the correct patient, site, side and location. The patient did receive antibiotics prior to the incision and was re-dosed during the procedure as needed at indicated intervals.  A tourniquet was placed.  The patient had the operative extremity prepped and draped in the standard surgical fashion.    Local anesthetic with epinephrine was first infiltrated into the planned surgical site.  Anatomic landmarks were palpated and marked on the skin.  A 5 cm incision was created over the lateral aspect of the elbow just anterior to the lateral epicondyle and radial head.  Dissection was carried down through the subcutaneous tissue which was bluntly elevated off of the underlying fascia.  The interval between the [facility] and ECRL was identified and sharply incised.  The muscle of the ECRL was bluntly and carefully elevated off of the underlying ECRB tendon.  The ECRB tendon did exhibit  the classic appearance of chronic tendinosis.  The diseased tissue was excised sharply along with the underlying joint capsule with a 15 blade.  Care was taken not to disrupt the attachment of the L UCL.  The radiocapitellar joint was unremarkable.  There is no synovitis within the joint.  The joint was carefully inspected for loose bodies.  After thorough removal of the diseased tissue the underlying bone was excoriated with a rondure to promote healing.  The joint was then thoroughly irrigated.  The common extensor tendon was repaired with interrupted 0 Vicryl suture which allowed watertight closure.  Subcutaneous layer closed with 3-0 Vicryl and the skin was closed with a running 4-0 Monocryl.  Steri-Strips were applied.  Sterile dressings were applied.  Long-arm splint placed.  Patient tolerated procedure well had no any complications.  Tawanna Cooler was necessary for opening, closing, retracting, limb positioning and overall facilitation and timely completion of the procedure.  POSTOPERATIVE PLAN: Patient will follow-up in the office in 1 week for splint removal and initiation of elbow range of motion.  Will place her in a Velcro wrist splint at that time.  Azucena Cecil, MD 7:59 AM

## 2022-01-24 NOTE — H&P (Signed)
PREOPERATIVE H&P  Chief Complaint: left lateral epicondylitis  HPI: Debra Adkins is a 62 y.o. female who presents for surgical treatment of left lateral epicondylitis.  She denies any changes in medical history.  Past Medical History:  Diagnosis Date   Allergy    Asthma    Blood transfusion without reported diagnosis    Carcinoid tumor of colon    Followed by Dr. Karis Juba in [place]   Chicken pox    Constipation    Diverticulosis    Hypertension    Thyroid disease    hoshimotos's thyroiditis at age 24   Past Surgical History:  Procedure Laterality Date   ABDOMINAL HYSTERECTOMY     states took everything but cervix?   DG GALL BLADDER     LAPAROSCOPIC GASTRIC SLEEVE RESECTION     THYROIDECTOMY     Social History   Socioeconomic History   Marital status: Married    Spouse name: Denman George   Number of children: 0   Years of education: some college   Highest education level: Not on file  Occupational History   Not on file  Tobacco Use   Smoking status: Former    Packs/day: 0.25    Years: 20.00    Total pack years: 5.00    Types: Cigarettes    Quit date: 01/20/2005    Years since quitting: 17.0   Smokeless tobacco: Never  Vaping Use   Vaping Use: Never used  Substance and Sexual Activity   Alcohol use: Yes    Comment: 2-4 glasses of wine, 8-10 in a week   Drug use: Never   Sexual activity: Not Currently  Other Topics Concern   Not on file  Social History Narrative   05/02/20   From: [place], but moved when she was high school   Living: with Denman George, husband (2019) but together since 2005   Work: not currently due to health issues      Family: brother in [place]      Enjoys: rescue dogs - has 5      Exercise: not currently due to joint issues   Diet: low calorie, salads      Safety   Seat belts: Yes    Guns: Yes  and secure   Safe in relationships: Yes    Social Determinants of Health   Financial Resource Strain: Not on file  Food Insecurity:  Not on file  Transportation Needs: Not on file  Physical Activity: Not on file  Stress: Not on file  Social Connections: Not on file   Family History  Problem Relation Age of Onset   Heart disease Father    Hyperlipidemia Father    Other Father        Heart transplant   Diabetes Brother    Heart disease Brother    Testicular cancer Brother    Heart disease Brother    Heart attack Brother    Allergies  Allergen Reactions   Chocolate Hives and Itching   Codeine Rash    nausea   Prior to Admission medications   Medication Sig Start Date End Date Taking? Authorizing Provider  beclomethasone (QVAR REDIHALER) 80 MCG/ACT inhaler Inhale 1 puff into the lungs 2 (two) times daily. 10/25/20  Yes Michela Pitcher, NP  cyanocobalamin (,VITAMIN B-12,) 1000 MCG/ML injection Inject 1 mL (1,000 mcg total) into the skin every 7 (seven) days. 06/05/21  Yes Michela Pitcher, NP  cyclobenzaprine (FLEXERIL) 5 MG tablet TAKE 1 TABLET BY  MOUTH 2 TIMES DAILY AS NEEDED FOR UP TO 15 DAYS FOR MUSCLE SPASMS 11/14/21  Yes Michela Pitcher, NP  levothyroxine (SYNTHROID) 125 MCG tablet Take 1 tablet by mouth every morning on an empty stomach with water only.  No food or other medications for 30 minutes. 07/02/21  Yes Michela Pitcher, NP  Pediatric Multiple Vitamins (FLINTSTONES MULTIVITAMIN PO) Take by mouth.   Yes [provider]  albuterol (VENTOLIN HFA) 108 (90 Base) MCG/ACT inhaler Inhale 2 puffs into the lungs every 6 (six) hours as needed for wheezing or shortness of breath. 05/16/21   Michela Pitcher, NP  promethazine (PHENERGAN) 25 MG tablet Take 12.5-25 mg by mouth 3 (three) times daily as needed. 04/16/20   [provider]  Syringe/Needle, Disp, 25G X 1" 1 ML MISC Use one syringe to administer B12 injection 06/05/21   Michela Pitcher, NP  VITAMIN D PO Take by mouth.    [provider]     Positive ROS: All other systems have been reviewed and were otherwise negative with the exception of  those mentioned in the HPI and as above.  Physical Exam: General: Alert, no acute distress Cardiovascular: No pedal edema Respiratory: No cyanosis, no use of accessory musculature GI: abdomen soft Skin: No lesions in the area of chief complaint Neurologic: Sensation intact distally Psychiatric: Patient is competent for consent with normal mood and affect Lymphatic: no lymphedema  MUSCULOSKELETAL: exam stable  Assessment: left lateral epicondylitis  Plan: Plan for Procedure(s): LEFT LATERAL EPICONDYLE DEBRIDEMENT, REPAIR AS INIDICATED  The risks benefits and alternatives were discussed with the patient including but not limited to the risks of nonoperative treatment, versus surgical intervention including infection, bleeding, nerve injury,  blood clots, cardiopulmonary complications, morbidity, mortality, among others, and they were willing to proceed.   Eduard Roux, MD 01/24/2022 6:07 AM

## 2022-01-24 NOTE — Anesthesia Postprocedure Evaluation (Signed)
Anesthesia Post Note  Patient: Debra Adkins  Procedure(s) Performed: LEFT LATERAL EPICONDYLE DEBRIDEMENT, REPAIR AS INIDICATED (Left: Elbow)     Patient location during evaluation: PACU Anesthesia Type: Regional Level of consciousness: awake and alert Pain management: pain level controlled Vital Signs Assessment: post-procedure vital signs reviewed and stable Respiratory status: spontaneous breathing Cardiovascular status: stable Anesthetic complications: no   No notable events documented.  Last Vitals:  Vitals:   01/24/22 0845 01/24/22 0857  BP: 111/69 109/71  Pulse: 62 65  Resp: 13 16  Temp: (!) 36.3 C (!) 36.3 C  SpO2: 95% 98%    Last Pain:  Vitals:   01/24/22 0617  TempSrc: Oral  PainSc: 0-No pain                 Nolon Nations

## 2022-01-24 NOTE — Progress Notes (Signed)
Assisted Dr. Germeroth with left, axillary, ultrasound guided block. Side rails up, monitors on throughout procedure. See vital signs in flow sheet. Tolerated Procedure well. 

## 2022-01-27 ENCOUNTER — Other Ambulatory Visit: Payer: Self-pay | Admitting: Nurse Practitioner

## 2022-01-27 ENCOUNTER — Telehealth: Payer: Self-pay | Admitting: Orthopaedic Surgery

## 2022-01-27 ENCOUNTER — Encounter (HOSPITAL_BASED_OUTPATIENT_CLINIC_OR_DEPARTMENT_OTHER): Payer: Self-pay | Admitting: Orthopaedic Surgery

## 2022-01-27 DIAGNOSIS — E038 Other specified hypothyroidism: Secondary | ICD-10-CM

## 2022-01-27 NOTE — Telephone Encounter (Signed)
Pt called stating Dr Erlinda Hong wants to see her 1 wk post op and not 2. Pt has an appt on Jan 19. Please call pt about this matter at [phone].

## 2022-01-27 NOTE — Telephone Encounter (Signed)
Called and scheduled for this Thursday.

## 2022-01-28 ENCOUNTER — Other Ambulatory Visit: Payer: Self-pay | Admitting: Nurse Practitioner

## 2022-01-30 ENCOUNTER — Ambulatory Visit: Payer: Managed Care, Other (non HMO) | Admitting: Orthopaedic Surgery

## 2022-01-30 DIAGNOSIS — M7712 Lateral epicondylitis, left elbow: Secondary | ICD-10-CM

## 2022-01-30 MED ORDER — HYDROCODONE-ACETAMINOPHEN 5-325 MG PO TABS: 1.0000 | ORAL_TABLET | Freq: Two times a day (BID) | ORAL | 0 refills | Status: DC | PRN

## 2022-01-30 NOTE — Progress Notes (Signed)
Post-Op Visit Note   Patient: Debra Adkins           Date of Birth: 10-30-1960           MRN: [id number] Visit Date: 01/30/2022 PCP: Michela Pitcher, NP   Assessment & Plan:  Chief Complaint:  Chief Complaint  Patient presents with   Left Elbow - Routine Post Op   Visit Diagnoses:  1. Lateral epicondylitis of left elbow     Plan: Patient is a pleasant 62 year old female who comes in today approximately 1 week status post left tennis elbow debridement, date of surgery 01/24/2022.  She has been doing well.  She has been taking an occasional Norco for pain.  She notes slight tingling to the ring and small fingers.  Examination of the left elbow reveals a well-healed surgical incision without complication.  Does have slight decrease sensation to the ring and small fingers.  Today, new Steri-Strips were applied.  She was provided with a Velcro wrist splint for which she will wear at all times other than hygiene.  Will start her in formal physical therapy for range of motion of the elbow.  She will follow-up in 5 weeks for recheck.  Call with concerns or questions.  Follow-Up Instructions: Return in about 5 weeks (around 03/06/2022).   Orders:  No orders of the defined types were placed in this encounter.  No orders of the defined types were placed in this encounter.   Imaging: No new imaging  PMFS History: Patient Active Problem List   Diagnosis Date Noted   Lateral epicondylitis, left elbow 08/22/2021   Chronic venous insufficiency 08/02/2021   Left arm pain 06/05/2021   Lumbar pain 05/15/2021   Left elbow pain 05/15/2021   Preventative health care 05/15/2021   Varicose veins of both lower extremities with pain 05/15/2021   Hiatal hernia 05/02/2020   History of malignant carcinoid tumor of stomach 05/02/2020   B12 deficiency 10/20/2019   History of gastric surgery 10/20/2019   Hypothyroidism due to Hashimoto's thyroiditis 10/20/2019   Moderate persistent asthma without  complication 46/50/3546   COVID-19 03/17/2019   Past Medical History:  Diagnosis Date   Allergy    Asthma    Blood transfusion without reported diagnosis    Carcinoid tumor of colon    Followed by Dr. Karis Juba in [place]   Chicken pox    Constipation    Diverticulosis    Hypertension    Thyroid disease    hoshimotos's thyroiditis at age 65    Family History  Problem Relation Age of Onset   Heart disease Father    Hyperlipidemia Father    Other Father        Heart transplant   Diabetes Brother    Heart disease Brother    Testicular cancer Brother    Heart disease Brother    Heart attack Brother     Past Surgical History:  Procedure Laterality Date   ABDOMINAL HYSTERECTOMY     states took everything but cervix?   DG GALL BLADDER     LAPAROSCOPIC GASTRIC SLEEVE RESECTION     TENNIS ELBOW RELEASE/NIRSCHEL PROCEDURE Left 01/24/2022   Procedure: LEFT LATERAL EPICONDYLE DEBRIDEMENT, REPAIR AS INIDICATED;  Surgeon: Leandrew Koyanagi, MD;  Location: [facility];  Service: Orthopedics;  Laterality: Left;   THYROIDECTOMY     Social History   Occupational History   Not on file  Tobacco Use   Smoking status: Former    Packs/day:  0.25    Years: 20.00    Total pack years: 5.00    Types: Cigarettes    Quit date: 01/20/2005    Years since quitting: 17.0   Smokeless tobacco: Never  Vaping Use   Vaping Use: Never used  Substance and Sexual Activity   Alcohol use: Yes    Comment: 2-4 glasses of wine, 8-10 in a week   Drug use: Never   Sexual activity: Not Currently

## 2022-02-07 ENCOUNTER — Encounter: Payer: Managed Care, Other (non HMO) | Admitting: Orthopaedic Surgery

## 2022-02-11 NOTE — Therapy (Signed)
OUTPATIENT PHYSICAL THERAPY EVALUATION   Patient Name: Debra Adkins MRN: [id number] DOB:Dec 16, 1960, 62 y.o., female Today's Date: 02/12/2022  END OF SESSION:  PT End of Session - 02/12/22 0948     Visit Number 1    Number of Visits 20    Date for PT Re-Evaluation 04/23/22    PT Start Time 1000    PT Stop Time 1028    PT Time Calculation (min) 28 min    Activity Tolerance Patient tolerated treatment well    Behavior During Therapy WFL for tasks assessed/performed             Past Medical History:  Diagnosis Date   Allergy    Asthma    Blood transfusion without reported diagnosis    Carcinoid tumor of colon    Followed by Dr. Karis Juba in [place]   Chicken pox    Constipation    Diverticulosis    Hypertension    Thyroid disease    hoshimotos's thyroiditis at age 49   Past Surgical History:  Procedure Laterality Date   ABDOMINAL HYSTERECTOMY     states took everything but cervix?   DG GALL BLADDER     LAPAROSCOPIC GASTRIC SLEEVE RESECTION     TENNIS ELBOW RELEASE/NIRSCHEL PROCEDURE Left 01/24/2022   Procedure: LEFT LATERAL EPICONDYLE DEBRIDEMENT, REPAIR AS INIDICATED;  Surgeon: Leandrew Koyanagi, MD;  Location: [place];  Service: Orthopedics;  Laterality: Left;   THYROIDECTOMY     Patient Active Problem List   Diagnosis Date Noted   Lateral epicondylitis, left elbow 08/22/2021   Chronic venous insufficiency 08/02/2021   Left arm pain 06/05/2021   Lumbar pain 05/15/2021   Left elbow pain 05/15/2021   Preventative health care 05/15/2021   Varicose veins of both lower extremities with pain 05/15/2021   Hiatal hernia 05/02/2020   History of malignant carcinoid tumor of stomach 05/02/2020   B12 deficiency 10/20/2019   History of gastric surgery 10/20/2019   Hypothyroidism due to Hashimoto's thyroiditis 10/20/2019   Moderate persistent asthma without complication 38/45/3646   COVID-19 03/17/2019    PCP: Karl Ito NP  REFERRING PROVIDER:  Aundra Dubin, PA-C  REFERRING DIAG: [phone] (ICD-10-CM) - Lateral epicondylitis of left elbow  THERAPY DIAG:  Pain in left elbow  Muscle weakness (generalized)  Rationale for Evaluation and Treatment: Rehabilitation  ONSET DATE: S/p left tennis elbow debridement - 01/24/2022  SUBJECTIVE:                                                                                                                                                                                      SUBJECTIVE STATEMENT: S/p left tennis  elbow debridement - 01/24/2022.  She indicated she has been trying not to use it too much.  She did reported having some difficulty sleeping.  She indicated having some complaints of pain and "rigidity"  Tries to wear brace most of the day.   PERTINENT HISTORY: Asthma, HTN, thyroid disease (Hoshimotos), Lt arm and elbow pain, hiatal hernai, vericose veins B LEs  PAIN:  NPRS scale: current 4/10, at worst 7/10 Pain location: Lt elbow Pain description: rigidity,  Aggravating factors: nighttime Relieving factors: medicine when necessary  PRECAUTIONS: ROM focus per MD note, strengthening easy transitioning 5-6 weeks post surgery per existing protocols.   WEIGHT BEARING RESTRICTIONS: No  FALLS:  Has patient fallen in last 6 months? No  LIVING ENVIRONMENT: Lives in: House/apartment Stairs: stairs to enter   OCCUPATION: No specific work  PLOF: Independent, [place] handed.  Takes care of 5 dogs, housework.  Has limited walking and cleaning due to symptoms.   PATIENT GOALS: Have no pain, get back to activity  OBJECTIVE:   PATIENT SURVEYS:  02/12/2022 FOTO intake:  47  predicted:  61  COGNITION: 02/12/2022 Overall cognitive status: WFL     SENSATION: 02/12/2022 No specific testing  POSTURE: 02/12/2022 Mild rounded shoulders  UPPER EXTREMITY ROM:   ROM Right 02/12/2022 Left 02/12/2022  All wrist AROM tested in sitting with arm supported on chair rest arm at side   Shoulder flexion    Shoulder extension    Shoulder abduction    Shoulder adduction    Shoulder internal rotation    Shoulder external rotation    Elbow flexion  WFL soft tissue approximation c discomfort  Elbow extension  0 AROM c discomfort  Wrist flexion  75 AROM c discomfort  Wrist extension  65 AROM c discomfort  Wrist ulnar deviation  28 AROM   Wrist radial deviation  35 AROM   Wrist pronation  100 AROM  Wrist supination  85 AROM  (Blank rows = not tested)  UPPER EXTREMITY MMT:  MMT Right 02/12/2022 Left 02/12/2022  Shoulder flexion    Shoulder extension    Shoulder abduction    Shoulder adduction    Shoulder internal rotation    Shoulder external rotation    Middle trapezius    Lower trapezius    Elbow flexion  Not tested due to surgery  Elbow extension  Not tested due to surgery  Wrist flexion  Not tested due to surgery  Wrist extension  Not tested due to surgery  Wrist ulnar deviation  Not tested due to surgery  Wrist radial deviation  Not tested due to surgery  Wrist pronation  Not tested due to surgeryNot tested due to surgery  Wrist supination    Grip strength (lbs) 57.5, 65.8 lbs 22.8, 20.2 lbs within pain free range  (Blank rows = not tested)  SPECIAL TESTS: 02/12/2022 No specific testing  JOINT MOBILITY TESTING:  02/12/2022 No specific testing  PALPATION:  02/12/2022 Tenderness around incision.  Visual inspection showed no signs of infection.    TODAY'S TREATMENT:  DATE:02/12/2022 Therex: HEP instruction/performance c cues for techniques, handout provided.  Trial set performed of each for comprehension and symptom assessment.  See below for exercise list   PATIENT EDUCATION: 02/12/2022 Education details: HEP, POC Person educated: Patient Education method: Explanation, Demonstration, Verbal cues, and Handouts Education comprehension: verbalized understanding,  returned demonstration, and verbal cues required  HOME EXERCISE PROGRAM: Access Code: [email or web address] URL: [email or web address] Date: 02/12/2022 Prepared by: Scot Jun  Exercises - Seated Scapular Retraction  - 3-5 x daily - 7 x weekly - 1 sets - 10 reps - 3-5 hold - Seated Elbow Flexion and Extension AROM (Mirrored)  - 3-5 x daily - 7 x weekly - 1 sets - 10 reps - Seated Forearm Pronation and Supination AROM  - 3-5 x daily - 7 x weekly - 1 sets - 10 reps - Wrist Extension AROM  - 3-5 x daily - 7 x weekly - 1 sets - 10 reps - Seated Gripping Towel  - 3-5 x daily - 7 x weekly - 1 sets - 10 reps - 5 hold  ASSESSMENT:  CLINICAL IMPRESSION: Patient is a 62 y.o. who comes to clinic with complaints of Lt elbow pain with mobility, strength and movement coordination deficits that impair their ability to perform usual daily and recreational functional activities without increase difficulty/symptoms at this time.  Patient to benefit from skilled PT services to address impairments and limitations to improve to previous level of function without restriction secondary to condition.   OBJECTIVE IMPAIRMENTS: decreased activity tolerance, decreased coordination, decreased endurance, decreased ROM, decreased strength, impaired perceived functional ability, impaired flexibility, impaired UE functional use, improper body mechanics, and pain.   ACTIVITY LIMITATIONS: carrying, lifting, sleeping, bathing, dressing, reach over head, and hygiene/grooming  PARTICIPATION LIMITATIONS: meal prep, cleaning, laundry, interpersonal relationship, driving, shopping, community activity, and dog care  PERSONAL FACTORS:  no specific factors  are also affecting patient's functional outcome.   REHAB POTENTIAL: Good  CLINICAL DECISION MAKING: Stable/uncomplicated  EVALUATION COMPLEXITY: Low   GOALS: Goals reviewed with patient? Yes  SHORT TERM GOALS: (target date for Short term goals are 3 weeks  03/05/2022)  1.Patient will demonstrate independent use of home exercise program to maintain progress from in clinic treatments. Goal status: New  LONG TERM GOALS: (target dates for all long term goals are 10 weeks  04/23/2022 )   1. Patient will demonstrate/report pain at worst less than or equal to 2/10 to facilitate minimal limitation in daily activity secondary to pain symptoms. Goal status: New   2. Patient will demonstrate independent use of home exercise program to facilitate ability to maintain/progress functional gains from skilled physical therapy services. Goal status: New   3. Patient will demonstrate FOTO outcome > or = 61 % to indicate reduced disability due to condition. Goal status: New   4.  Patient will demonstrate Lt UE MMT 5/5 throughout to facilitate lifting, reaching, carrying at [facility] in daily activity.   Goal status: New   5.  Patient will demonstrate Lt elbow joint/wrist AROM WFL s symptoms to facilitate usual overhead reaching, self care, dressing at PLOF.    Goal status: New   6.  Patient will demonstrate grip strength Lt painfree within 15% of Rt for daily grasping tasks.  Goal status: New    PLAN:  PT FREQUENCY: 1-2x/week  PT DURATION: 10 weeks  PLANNED INTERVENTIONS: Therapeutic exercises, Therapeutic activity, Neuro Muscular re-education, Balance training, Gait training, Patient/Family education, Joint mobilization, Stair training, DME instructions, Dry Needling, Electrical stimulation, Traction,  Cryotherapy, vasopneumatic device Moist heat, Taping, Ultrasound, Ionotophoresis '4mg'$ /ml Dexamethasone, and Manual therapy.  All included unless contraindicated  PLAN FOR NEXT SESSION: Review HEP knowledge/results.   ROM focus until approx 5 weeks post op, then light strengthening within painfree range.     Scot Jun, PT, DPT, OCS, ATC 02/12/22  10:32 AM

## 2022-02-12 ENCOUNTER — Ambulatory Visit (INDEPENDENT_AMBULATORY_CARE_PROVIDER_SITE_OTHER): Payer: Managed Care, Other (non HMO) | Admitting: Rehabilitative and Restorative Service Providers"

## 2022-02-12 ENCOUNTER — Other Ambulatory Visit: Payer: Self-pay

## 2022-02-12 ENCOUNTER — Encounter: Payer: Self-pay | Admitting: Rehabilitative and Restorative Service Providers"

## 2022-02-12 DIAGNOSIS — M6281 Muscle weakness (generalized): Secondary | ICD-10-CM | POA: Diagnosis not present

## 2022-02-12 DIAGNOSIS — M25522 Pain in left elbow: Secondary | ICD-10-CM

## 2022-02-26 ENCOUNTER — Encounter: Payer: Managed Care, Other (non HMO) | Admitting: Rehabilitative and Restorative Service Providers"

## 2022-03-02 ENCOUNTER — Other Ambulatory Visit: Payer: Self-pay | Admitting: Nurse Practitioner

## 2022-03-03 ENCOUNTER — Encounter: Payer: Managed Care, Other (non HMO) | Admitting: Rehabilitative and Restorative Service Providers"

## 2022-03-03 NOTE — Telephone Encounter (Signed)
Spoke to pt, pt stated she currently has the flu & would call us back to scheduled cpe. Call back # [phone]

## 2022-03-06 ENCOUNTER — Ambulatory Visit: Payer: Managed Care, Other (non HMO) | Admitting: Physician Assistant

## 2022-03-06 ENCOUNTER — Encounter: Payer: Self-pay | Admitting: Rehabilitative and Restorative Service Providers"

## 2022-03-06 ENCOUNTER — Ambulatory Visit: Payer: Managed Care, Other (non HMO) | Admitting: Rehabilitative and Restorative Service Providers"

## 2022-03-06 DIAGNOSIS — M7712 Lateral epicondylitis, left elbow: Secondary | ICD-10-CM

## 2022-03-06 DIAGNOSIS — M25632 Stiffness of left wrist, not elsewhere classified: Secondary | ICD-10-CM | POA: Diagnosis not present

## 2022-03-06 DIAGNOSIS — M25622 Stiffness of left elbow, not elsewhere classified: Secondary | ICD-10-CM | POA: Diagnosis not present

## 2022-03-06 DIAGNOSIS — M6281 Muscle weakness (generalized): Secondary | ICD-10-CM | POA: Diagnosis not present

## 2022-03-06 DIAGNOSIS — M25522 Pain in left elbow: Secondary | ICD-10-CM

## 2022-03-06 MED ORDER — HYDROCODONE-ACETAMINOPHEN 5-325 MG PO TABS: 1.0000 | ORAL_TABLET | Freq: Every day | ORAL | 0 refills | Status: DC | PRN

## 2022-03-06 NOTE — Progress Notes (Signed)
Post-Op Visit Note   Patient: Debra Adkins           Date of Birth: 1960-12-03           MRN: [id number] Visit Date: 03/06/2022 PCP: Michela Pitcher, NP   Assessment & Plan:  Chief Complaint:  Chief Complaint  Patient presents with   Left Elbow - Follow-up   Visit Diagnoses:  1. Lateral epicondylitis, left elbow     Plan: Patient is a pleasant 62 year old female who comes in today approximately 6 weeks status post left tennis elbow debridement.  She has been doing well.  She has been wearing her wrist splint.  She has been working on a home exercise program as well as been going to outpatient physical therapy.  She is taking occasional Norco at night.  Examination of her left elbow reveals a fully healed surgical scar without complication.  Painless range of motion at the elbow.  She is neurovascularly intact distally.  At this point, she will continue with her home exercise program.  She will avoid lifting greater than 10 pounds for the next 6 weeks.  She will follow-up at that point for recheck.  Call with concerns or questions.  Follow-Up Instructions: Return in about 6 weeks (around 04/17/2022).   Orders:  No orders of the defined types were placed in this encounter.  No orders of the defined types were placed in this encounter.   Imaging: No new imaging  PMFS History: Patient Active Problem List   Diagnosis Date Noted   Lateral epicondylitis, left elbow 08/22/2021   Chronic venous insufficiency 08/02/2021   Left arm pain 06/05/2021   Lumbar pain 05/15/2021   Left elbow pain 05/15/2021   Preventative health care 05/15/2021   Varicose veins of both lower extremities with pain 05/15/2021   Hiatal hernia 05/02/2020   History of malignant carcinoid tumor of stomach 05/02/2020   B12 deficiency 10/20/2019   History of gastric surgery 10/20/2019   Hypothyroidism due to Hashimoto's thyroiditis 10/20/2019   Moderate persistent asthma without complication 0000000    COVID-19 03/17/2019   Past Medical History:  Diagnosis Date   Allergy    Asthma    Blood transfusion without reported diagnosis    Carcinoid tumor of colon    Followed by Dr. Karis Juba in [place]   Chicken pox    Constipation    Diverticulosis    Hypertension    Thyroid disease    hoshimotos's thyroiditis at age 31    Family History  Problem Relation Age of Onset   Heart disease Father    Hyperlipidemia Father    Other Father        Heart transplant   Diabetes Brother    Heart disease Brother    Testicular cancer Brother    Heart disease Brother    Heart attack Brother     Past Surgical History:  Procedure Laterality Date   ABDOMINAL HYSTERECTOMY     states took everything but cervix?   DG GALL BLADDER     LAPAROSCOPIC GASTRIC SLEEVE RESECTION     TENNIS ELBOW RELEASE/NIRSCHEL PROCEDURE Left 01/24/2022   Procedure: LEFT LATERAL EPICONDYLE DEBRIDEMENT, REPAIR AS INIDICATED;  Surgeon: Leandrew Koyanagi, MD;  Location: [place];  Service: Orthopedics;  Laterality: Left;   THYROIDECTOMY     Social History   Occupational History   Not on file  Tobacco Use   Smoking status: Former    Packs/day: 0.25    Years:  20.00    Total pack years: 5.00    Types: Cigarettes    Quit date: 01/20/2005    Years since quitting: 17.1   Smokeless tobacco: Never  Vaping Use   Vaping Use: Never used  Substance and Sexual Activity   Alcohol use: Yes    Comment: 2-4 glasses of wine, 8-10 in a week   Drug use: Never   Sexual activity: Not Currently

## 2022-03-06 NOTE — Therapy (Addendum)
OUTPATIENT PHYSICAL THERAPY TREATMENT /PROGRESS NOTE /DISCHARGE   Patient Name: Debra Adkins MRN: [id number] DOB:07-08-1960, 62 y.o., female Today's Date: 03/06/2022   Progress Note Reporting Period 02/12/2022 to 03/06/2022  See note below for Objective Data and Assessment of Progress/Goals.      END OF SESSION:  PT End of Session - 03/06/22 0933     Visit Number 2    Number of Visits 20    Date for PT Re-Evaluation 04/23/22    PT Start Time 0910    PT Stop Time 0940    PT Time Calculation (min) 30 min    Activity Tolerance Patient tolerated treatment well    Behavior During Therapy [facility] for tasks assessed/performed              Past Medical History:  Diagnosis Date   Allergy    Asthma    Blood transfusion without reported diagnosis    Carcinoid tumor of colon    Followed by Dr. Karis Juba in [place]   Chicken pox    Constipation    Diverticulosis    Hypertension    Thyroid disease    hoshimotos's thyroiditis at age 85   Past Surgical History:  Procedure Laterality Date   ABDOMINAL HYSTERECTOMY     states took everything but cervix?   DG GALL BLADDER     LAPAROSCOPIC GASTRIC SLEEVE RESECTION     TENNIS ELBOW RELEASE/NIRSCHEL PROCEDURE Left 01/24/2022   Procedure: LEFT LATERAL EPICONDYLE DEBRIDEMENT, REPAIR AS INIDICATED;  Surgeon: Leandrew Koyanagi, MD;  Location: [place];  Service: Orthopedics;  Laterality: Left;   THYROIDECTOMY     Patient Active Problem List   Diagnosis Date Noted   Lateral epicondylitis, left elbow 08/22/2021   Chronic venous insufficiency 08/02/2021   Left arm pain 06/05/2021   Lumbar pain 05/15/2021   Left elbow pain 05/15/2021   Preventative health care 05/15/2021   Varicose veins of both lower extremities with pain 05/15/2021   Hiatal hernia 05/02/2020   History of malignant carcinoid tumor of stomach 05/02/2020   B12 deficiency 10/20/2019   History of gastric surgery 10/20/2019   Hypothyroidism due to  Hashimoto's thyroiditis 10/20/2019   Moderate persistent asthma without complication 23/53/6144   COVID-19 03/17/2019    PCP: Karl Ito NP  REFERRING PROVIDER: Aundra Dubin, PA-C  REFERRING DIAG: [phone] (ICD-10-CM) - Lateral epicondylitis of left elbow  THERAPY DIAG:  Pain in left elbow  Muscle weakness (generalized)  Stiffness of left elbow, not elsewhere classified  Stiffness of left wrist, not elsewhere classified  Rationale for Evaluation and Treatment: Rehabilitation  ONSET DATE: S/p left tennis elbow debridement - 01/24/2022  SUBJECTIVE:  SUBJECTIVE STATEMENT: She indicated a little tender upon arrival today.  She indicated she had been sick and that impacted her ability to come in.    PERTINENT HISTORY: Asthma, HTN, thyroid disease (Hoshimotos), Lt arm and elbow pain, hiatal hernai, vericose veins B LEs  PAIN:  NPRS scale: at worst in last few days 3/10 Pain location: Lt elbow Pain description: rigidity,  Aggravating factors: nighttime Relieving factors: medicine when necessary  PRECAUTIONS: ROM focus per MD note, strengthening easy transitioning 5-6 weeks post surgery per existing protocols.   WEIGHT BEARING RESTRICTIONS: No  FALLS:  Has patient fallen in last 6 months? No  LIVING ENVIRONMENT: Lives in: House/apartment Stairs: stairs to enter   OCCUPATION: No specific work  PLOF: Independent, [place] handed.  Takes care of 5 dogs, housework.  Has limited walking and cleaning due to symptoms.   PATIENT GOALS: Have no pain, get back to activity  OBJECTIVE:   PATIENT SURVEYS:  03/06/2022:  FOTO update 65  02/12/2022 FOTO intake:  47  predicted:  61  COGNITION: 02/12/2022 Overall cognitive status: WFL     SENSATION: 02/12/2022 No specific  testing  POSTURE: 02/12/2022 Mild rounded shoulders  UPPER EXTREMITY ROM:   ROM Right 02/12/2022 Left 02/12/2022  All wrist AROM tested in sitting with arm supported on chair rest arm at side Left 03/06/2022  Shoulder flexion     Shoulder extension     Shoulder abduction     Shoulder adduction     Shoulder internal rotation     Shoulder external rotation     Elbow flexion  WFL soft tissue approximation c discomfort   Elbow extension  0 AROM c discomfort   Wrist flexion  75 AROM c discomfort 75 AROM  Wrist extension  65 AROM c discomfort 70 AROM  Wrist ulnar deviation  28 AROM  35 AROM  Wrist radial deviation  35 AROM  35 AROM  Wrist pronation  100 AROM 105 AROM  Wrist supination  85 AROM 95 AROM  (Blank rows = not tested)  UPPER EXTREMITY MMT:  MMT Right 02/12/2022 Left 02/12/2022 Left 03/06/2022  Shoulder flexion     Shoulder extension     Shoulder abduction     Shoulder adduction     Shoulder internal rotation     Shoulder external rotation     Middle trapezius     Lower trapezius     Elbow flexion  Not tested due to surgery 5/5  Elbow extension  Not tested due to surgery 5/5  Wrist flexion  Not tested due to surgery 5/5  Wrist extension  Not tested due to surgery 5/5  Wrist ulnar deviation  Not tested due to surgery   Wrist radial deviation  Not tested due to surgery   Wrist pronation  Not tested due to surgeryNot tested due to surgery 5/5  Wrist supination   5/5  Grip strength (lbs) 57.5, 65.8 lbs 22.8, 20.2 lbs within pain free range 51.9, 48.3 lbs  (Blank rows = not tested)  SPECIAL TESTS: 02/12/2022 No specific testing  JOINT MOBILITY TESTING:  02/12/2022 No specific testing  PALPATION:  02/12/2022 Tenderness around incision.  Visual inspection showed no signs of infection.    TODAY'S TREATMENT:  DATE:03/06/2022 Therex: Review of existing HEP.  Adjustment of frequency due  to improvements.  Wall push up 2 x 10 Band green rows bilateral  x 15 Band gh ext bilateral x 15 UBE fwd/back 3 mins each way lvl 2.5  TODAY'S TREATMENT:                                                                                    DATE:02/12/2022 Therex: HEP instruction/performance c cues for techniques, handout provided.  Trial set performed of each for comprehension and symptom assessment.  See below for exercise list   PATIENT EDUCATION: 03/06/2022 Education details: HEP update Person educated: Patient Education method: Explanation, Demonstration, Verbal cues, and Handouts Education comprehension: verbalized understanding, returned demonstration, and verbal cues required  HOME EXERCISE PROGRAM: Access Code: [email or web address] URL: [email or web address] Date: 03/06/2022 Prepared by: Scot Jun  Exercises - Seated Scapular Retraction  - 1-2 x daily - 7 x weekly - 1 sets - 10 reps - 3-5 hold - Seated Elbow Flexion and Extension AROM (Mirrored)  - 1-2 x daily - 7 x weekly - 1 sets - 10 reps - Seated Forearm Pronation and Supination AROM  - 1-2 x daily - 7 x weekly - 1 sets - 10 reps - Wrist Extension AROM  - 1-2 x daily - 7 x weekly - 1 sets - 10 reps - Seated Gripping Towel  - 1-2 x daily - 7 x weekly - 1 sets - 10 reps - 5 hold - Standing Bilateral Low Shoulder Row with Anchored Resistance  - 1-2 x daily - 7 x weekly - 1-2 sets - 10-15 reps - Shoulder Extension with Resistance  - 1-2 x daily - 7 x weekly - 1-2 sets - 10-15 reps - Wall Push Up  - 1-2 x daily - 7 x weekly - 1-2 sets - 10-15 reps  ASSESSMENT:  CLINICAL IMPRESSION: Pt has attended 2 visits (limited attendance due to illness) in treatment cycle.  She has reported continued improvement in presentation and symptoms.  See objective data for updated information.  Pt has demonstrated improvements towards goals as noted.   OBJECTIVE IMPAIRMENTS: decreased activity tolerance, decreased coordination, decreased  endurance, decreased ROM, decreased strength, impaired perceived functional ability, impaired flexibility, impaired UE functional use, improper body mechanics, and pain.   ACTIVITY LIMITATIONS: carrying, lifting, sleeping, bathing, dressing, reach over head, and hygiene/grooming  PARTICIPATION LIMITATIONS: meal prep, cleaning, laundry, interpersonal relationship, driving, shopping, community activity, and dog care  PERSONAL FACTORS:  no specific factors  are also affecting patient's functional outcome.   REHAB POTENTIAL: Good  CLINICAL DECISION MAKING: Stable/uncomplicated  EVALUATION COMPLEXITY: Low   GOALS: Goals reviewed with patient? Yes  SHORT TERM GOALS: (target date for Short term goals are 3 weeks 03/05/2022)  1.Patient will demonstrate independent use of home exercise program to maintain progress from in clinic treatments. Goal status: Met  LONG TERM GOALS: (target dates for all long term goals are 10 weeks  04/23/2022 )   1. Patient will demonstrate/report pain at worst less than or equal to 2/10 to facilitate minimal limitation in daily activity secondary to pain symptoms. Goal status: On going 03/06/2022   2. Patient  will demonstrate independent use of home exercise program to facilitate ability to maintain/progress functional gains from skilled physical therapy services. Goal status: on going 03/06/2022   3. Patient will demonstrate FOTO outcome > or = 61 % to indicate reduced disability due to condition. Goal status: Met 03/06/2022   4.  Patient will demonstrate Lt UE MMT 5/5 throughout to facilitate lifting, reaching, carrying at [facility] in daily activity.   Goal status: Met 03/06/2022   5.  Patient will demonstrate Lt elbow joint/wrist AROM WFL s symptoms to facilitate usual overhead reaching, self care, dressing at PLOF.    Goal status: Met 03/06/2022   6.  Patient will demonstrate grip strength Lt painfree within 15% of Rt for daily grasping tasks.  Goal status: on  going 03/06/2022    PLAN:  PT FREQUENCY: 1-2x/week  PT DURATION: 10 weeks  PLANNED INTERVENTIONS: Therapeutic exercises, Therapeutic activity, Neuro Muscular re-education, Balance training, Gait training, Patient/Family education, Joint mobilization, Stair training, DME instructions, Dry Needling, Electrical stimulation, Traction, Cryotherapy, vasopneumatic device Moist heat, Taping, Ultrasound, Ionotophoresis 4mg /ml Dexamethasone, and Manual therapy.  All included unless contraindicated  PLAN FOR NEXT SESSION:  Follow up visit review from MD visit.   Trial HEP at this time.  Discharge after 30 days inactivity.    Scot Jun, PT, DPT, OCS, ATC 03/06/22  9:43 AM  PHYSICAL THERAPY DISCHARGE SUMMARY  Visits from Start of Care: 2  Current functional level related to goals / functional outcomes: See note   Remaining deficits: See note   Education / Equipment: HEP  Patient goals were  mostly met . Patient is being discharged due to not returning since the last visit.  Scot Jun, PT, DPT, OCS, ATC 04/10/22  9:07 AM

## 2022-03-12 NOTE — Telephone Encounter (Signed)
Patient will call back mid March to be scheduled.

## 2022-03-20 ENCOUNTER — Ambulatory Visit (HOSPITAL_COMMUNITY)
Admission: EM | Admit: 2022-03-20 | Discharge: 2022-03-20 | Disposition: A | Discharge status: Home / Self Care | Payer: Managed Care, Other (non HMO) | Attending: Family Medicine | Admitting: Family Medicine

## 2022-03-20 ENCOUNTER — Ambulatory Visit (HOSPITAL_COMMUNITY): Payer: Managed Care, Other (non HMO)

## 2022-03-20 ENCOUNTER — Encounter (HOSPITAL_COMMUNITY): Payer: Self-pay | Admitting: *Deleted

## 2022-03-20 DIAGNOSIS — R059 Cough, unspecified: Secondary | ICD-10-CM | POA: Diagnosis not present

## 2022-03-20 DIAGNOSIS — J4521 Mild intermittent asthma with (acute) exacerbation: Secondary | ICD-10-CM | POA: Diagnosis not present

## 2022-03-20 DIAGNOSIS — J4540 Moderate persistent asthma, uncomplicated: Secondary | ICD-10-CM

## 2022-03-20 DIAGNOSIS — R0602 Shortness of breath: Secondary | ICD-10-CM | POA: Diagnosis not present

## 2022-03-20 DIAGNOSIS — R062 Wheezing: Secondary | ICD-10-CM

## 2022-03-20 DIAGNOSIS — J0110 Acute frontal sinusitis, unspecified: Secondary | ICD-10-CM

## 2022-03-20 MED ORDER — ALBUTEROL SULFATE HFA 108 (90 BASE) MCG/ACT IN AERS: 2.0000 | INHALATION_SPRAY | Freq: Four times a day (QID) | RESPIRATORY_TRACT | 1 refills | Status: AC | PRN

## 2022-03-20 MED ORDER — PREDNISONE 50 MG PO TABS: 50.0000 mg | ORAL_TABLET | Freq: Every day | ORAL | 0 refills | Status: AC

## 2022-03-20 MED ORDER — PREDNISONE 50 MG PO TABS: 50.0000 mg | ORAL_TABLET | Freq: Every day | ORAL | 0 refills | Status: DC

## 2022-03-20 MED ORDER — METHYLPREDNISOLONE SODIUM SUCC 125 MG IJ SOLR
INTRAMUSCULAR | Status: AC
  Filled 2022-03-20: qty 2

## 2022-03-20 MED ORDER — DOXYCYCLINE HYCLATE 100 MG PO CAPS: 100.0000 mg | ORAL_CAPSULE | Freq: Two times a day (BID) | ORAL | 0 refills | Status: DC

## 2022-03-20 MED ORDER — ALBUTEROL SULFATE (2.5 MG/3ML) 0.083% IN NEBU
2.5000 mg | INHALATION_SOLUTION | Freq: Once | RESPIRATORY_TRACT | Status: AC
  Administered 2022-03-20: 2.5 mg via RESPIRATORY_TRACT

## 2022-03-20 MED ORDER — METHYLPREDNISOLONE SODIUM SUCC 125 MG IJ SOLR
80.0000 mg | Freq: Once | INTRAMUSCULAR | Status: AC
  Administered 2022-03-20: 80 mg via INTRAMUSCULAR

## 2022-03-20 MED ORDER — ALBUTEROL SULFATE HFA 108 (90 BASE) MCG/ACT IN AERS: 2.0000 | INHALATION_SPRAY | Freq: Four times a day (QID) | RESPIRATORY_TRACT | 1 refills | Status: DC | PRN

## 2022-03-20 MED ORDER — ALBUTEROL SULFATE (2.5 MG/3ML) 0.083% IN NEBU
INHALATION_SOLUTION | RESPIRATORY_TRACT | Status: AC
  Filled 2022-03-20: qty 3

## 2022-03-20 NOTE — ED Triage Notes (Signed)
Pt states since Sunday she has had sore throat, fever (100) at home, Headache, congestion, cough. She has been taking some left over amox she had at home without relief. She has been taking mucinex without help as well. She ran out of her albuterol MDI

## 2022-03-20 NOTE — Discharge Instructions (Signed)
You were seen today for asthma exacerbation.  I have sent out an antibiotic, prednisone and inhaler to help with this today.  You may use over the counter medication for the cough.  If you have worsening shortness of breath or wheezing then please go to the ER for further evaluation.

## 2022-03-20 NOTE — ED Provider Notes (Signed)
[place]    CSN: [id number] Arrival date & time: 03/20/22  0900      History   Chief Complaint Chief Complaint  Patient presents with   Fever   Sore Throat   Nasal Congestion   Headache   Cough    HPI Debra Adkins is a 62 y.o. female.   Patient is here for URI symptoms.  She started feeling poorly 5 days ago with sore throat.  She had mild runny nose, but also had cough.  Her chest started feeling tight, worsening sinus congestion.  She finds it hard to cough anything up, she is having wheezing, sob, some tightness.  She does have h/o asthma (carcinoid syndrome).   She used the last of her inhaler but was not really helpful.   She feels like she has a car on her chest.  Her throat is not really sore anymore.   Has not slept in several days.  Using otc meds without help  Today thought she was going to pass out/vomit when taking out the trash.  Low grade temp only.        Past Medical History:  Diagnosis Date   Allergy    Asthma    Blood transfusion without reported diagnosis    Carcinoid tumor of colon    Followed by Dr. Karis Juba in [place] pox    Constipation    Diverticulosis    Hypertension    Thyroid disease    hoshimotos's thyroiditis at age 65    Patient Active Problem List   Diagnosis Date Noted   Lateral epicondylitis, left elbow 08/22/2021   Chronic venous insufficiency 08/02/2021   Left arm pain 06/05/2021   Lumbar pain 05/15/2021   Left elbow pain 05/15/2021   Preventative health care 05/15/2021   Varicose veins of both lower extremities with pain 05/15/2021   Hiatal hernia 05/02/2020   History of malignant carcinoid tumor of stomach 05/02/2020   B12 deficiency 10/20/2019   History of gastric surgery 10/20/2019   Hypothyroidism due to Hashimoto's thyroiditis 10/20/2019   Moderate persistent asthma without complication 0000000   COVID-19 03/17/2019    Past Surgical History:  Procedure Laterality Date    ABDOMINAL HYSTERECTOMY     states took everything but cervix?   DG GALL BLADDER     LAPAROSCOPIC GASTRIC SLEEVE RESECTION     TENNIS ELBOW RELEASE/NIRSCHEL PROCEDURE Left 01/24/2022   Procedure: LEFT LATERAL EPICONDYLE DEBRIDEMENT, REPAIR AS INIDICATED;  Surgeon: Leandrew Koyanagi, MD;  Location: [facility];  Service: Orthopedics;  Laterality: Left;   THYROIDECTOMY      OB History   No obstetric history on file.      Home Medications    Prior to Admission medications   Medication Sig Start Date End Date Taking? Authorizing Provider  albuterol (VENTOLIN HFA) 108 (90 Base) MCG/ACT inhaler Inhale 2 puffs into the lungs every 6 (six) hours as needed for wheezing or shortness of breath. 05/16/21  Yes Michela Pitcher, NP  cyanocobalamin (,VITAMIN B-12,) 1000 MCG/ML injection Inject 1 mL (1,000 mcg total) into the skin every 7 (seven) days. 06/05/21  Yes Michela Pitcher, NP  HYDROcodone-acetaminophen (NORCO) 5-325 MG tablet Take 1 tablet by mouth every 6 (six) hours as needed. 01/24/22  Yes Leandrew Koyanagi, MD  levothyroxine (SYNTHROID) 125 MCG tablet TAKE 1 TABLET BY MOUTH EVERY MORNING ON AN EMPTY STOMACH WITH WATER ONLY. NO FOOD OR OTHER MEDICATIONS FOR 30 MINUTES. 01/27/22  Yes Michela Pitcher, NP  Pediatric Multiple Vitamins (FLINTSTONES MULTIVITAMIN PO) Take by mouth.   Yes [provider]  Syringe/Needle, Disp, 25G X 1" 1 ML MISC Use one syringe to administer B12 injection 06/05/21  Yes Michela Pitcher, NP  VITAMIN D PO Take by mouth.   Yes [provider]  beclomethasone (QVAR REDIHALER) 80 MCG/ACT inhaler Inhale 1 puff into the lungs 2 (two) times daily. 10/25/20   Michela Pitcher, NP  cyclobenzaprine (FLEXERIL) 5 MG tablet TAKE 1 TABLET BY MOUTH 2 TIMES DAILY AS NEEDED FOR UP TO 15 DAYS FOR MUSCLE SPASMS 01/28/22   Michela Pitcher, NP  HYDROcodone-acetaminophen (NORCO) 5-325 MG tablet Take 1 tablet by mouth 2 (two) times daily as needed. 01/30/22   Aundra Dubin, PA-C   HYDROcodone-acetaminophen (NORCO) 5-325 MG tablet Take 1 tablet by mouth daily as needed. 03/06/22   Aundra Dubin, PA-C  promethazine (PHENERGAN) 25 MG tablet Take 12.5-25 mg by mouth 3 (three) times daily as needed. 04/16/20   [provider]    Family History Family History  Problem Relation Age of Onset   Heart disease Father    Hyperlipidemia Father    Other Father        Heart transplant   Diabetes Brother    Heart disease Brother    Testicular cancer Brother    Heart disease Brother    Heart attack Brother     Social History Social History   Tobacco Use   Smoking status: Former    Packs/day: 0.25    Years: 20.00    Total pack years: 5.00    Types: Cigarettes    Quit date: 01/20/2005    Years since quitting: 17.1   Smokeless tobacco: Never  Vaping Use   Vaping Use: Never used  Substance Use Topics   Alcohol use: Yes    Comment: 2-4 glasses of wine, 8-10 in a week   Drug use: Never     Allergies   Chocolate and Codeine   Review of Systems Review of Systems  Constitutional:  Positive for fatigue and fever.  HENT:  Positive for congestion, rhinorrhea, sinus pressure and sore throat.   Respiratory:  Positive for cough, shortness of breath and wheezing.   Cardiovascular: Negative.   Gastrointestinal: Negative.   Genitourinary: Negative.   Musculoskeletal: Negative.   Neurological:  Positive for headaches.  Psychiatric/Behavioral: Negative.       Physical Exam Triage Vital Signs ED Triage Vitals  Enc Vitals Group     BP 03/20/22 1002 (!) 160/81     Pulse Rate 03/20/22 1002 91     Resp 03/20/22 1002 20     Temp 03/20/22 1002 99.6 F (37.6 C)     Temp Source 03/20/22 1002 Oral     SpO2 03/20/22 1002 97 %     Weight --      Height --      Head Circumference --      Peak Flow --      Pain Score 03/20/22 0959 8     Pain Loc --      Pain Edu? --      Excl. in [place]? --    No data found.  Updated Vital Signs BP (!) 160/81 (BP Location:  Left Arm)   Pulse 91   Temp 99.6 F (37.6 C) (Oral)   Resp 20   SpO2 97%   Visual Acuity Right Eye Distance:   Left Eye Distance:  Bilateral Distance:    Right Eye Near:   Left Eye Near:    Bilateral Near:     Physical Exam Constitutional:      General: She is not in acute distress.    Appearance: Normal appearance. She is not ill-appearing.  HENT:     Nose: Congestion and rhinorrhea present.     Right Sinus: Maxillary sinus tenderness and frontal sinus tenderness present.     Left Sinus: Maxillary sinus tenderness and frontal sinus tenderness present.     Mouth/Throat:     Pharynx: No pharyngeal swelling, oropharyngeal exudate or posterior oropharyngeal erythema.     Tonsils: No tonsillar exudate.  Cardiovascular:     Rate and Rhythm: Normal rate and regular rhythm.  Pulmonary:     Effort: Pulmonary effort is normal. No respiratory distress.     Breath sounds: Wheezing and rhonchi present.  Musculoskeletal:     Cervical back: Normal range of motion and neck supple.  Skin:    General: Skin is warm.  Neurological:     General: No focal deficit present.     Mental Status: She is alert.  Psychiatric:        Mood and Affect: Mood normal.      UC Treatments / Results  Labs (all labs ordered are listed, but only abnormal results are displayed) Labs Reviewed - No data to display  EKG   Radiology DG Chest 2 View  Result Date: 03/20/2022 CLINICAL DATA:  Cough with wheezing and shortness of breath. EXAM: CHEST - 2 VIEW COMPARISON:  11/02/2020 FINDINGS: The heart size and mediastinal contours are within normal limits. Both lungs are clear. The visualized skeletal structures are unremarkable. IMPRESSION: No active cardiopulmonary disease. Electronically Signed   By: Misty Stanley M.D.   On: 03/20/2022 10:34    Procedures Procedures (including critical care time)  Medications Ordered in UC Medications  methylPREDNISolone sodium succinate (SOLU-MEDROL) 125 mg/2 mL  injection 80 mg (80 mg Intramuscular Given 03/20/22 1034)  albuterol (PROVENTIL) (2.5 MG/3ML) 0.083% nebulizer solution 2.5 mg (2.5 mg Nebulization Given 03/20/22 1033)    Initial Impression / Assessment and Plan / UC Course  I have reviewed the triage vital signs and the nursing notes.  Pertinent labs & imaging results that were available during my care of the patient were reviewed by me and considered in my medical decision making (see chart for details).   Final Clinical Impressions(s) / UC Diagnoses   Final diagnoses:  Mild intermittent asthma with acute exacerbation  Acute non-recurrent frontal sinusitis     Discharge Instructions      You were seen today for asthma exacerbation.  I have sent out an antibiotic, prednisone and inhaler to help with this today.  You may use over the counter medication for the cough.  If you have worsening shortness of breath or wheezing then please go to the ER for further evaluation.     ED Prescriptions     Medication Sig Dispense Auth. Provider   doxycycline (VIBRAMYCIN) 100 MG capsule  (Status: Discontinued) Take 1 capsule (100 mg total) by mouth 2 (two) times daily. 20 capsule Devonna Oboyle, MD   predniSONE (DELTASONE) 50 MG tablet  (Status: Discontinued) Take 1 tablet (50 mg total) by mouth daily for 5 days. 5 tablet Amram Maya, MD   albuterol (VENTOLIN HFA) 108 (90 Base) MCG/ACT inhaler  (Status: Discontinued) Inhale 2 puffs into the lungs every 6 (six) hours as needed for wheezing or shortness of breath. [place]  each Rondel Oh, MD   albuterol (VENTOLIN HFA) 108 (90 Base) MCG/ACT inhaler Inhale 2 puffs into the lungs every 6 (six) hours as needed for wheezing or shortness of breath. 18 each Dmani Mizer, MD   doxycycline (VIBRAMYCIN) 100 MG capsule Take 1 capsule (100 mg total) by mouth 2 (two) times daily. 20 capsule Antoney Biven, MD   predniSONE (DELTASONE) 50 MG tablet Take 1 tablet (50 mg total) by mouth daily for 5 days. 5 tablet  Rondel Oh, MD      PDMP not reviewed this encounter.   Rondel Oh, MD 03/20/22 1105

## 2022-03-28 ENCOUNTER — Other Ambulatory Visit: Payer: Self-pay | Admitting: Nurse Practitioner

## 2022-03-28 MED ORDER — CYCLOBENZAPRINE HCL 5 MG PO TABS: ORAL_TABLET | ORAL | 1 refills | Status: DC

## 2022-03-28 NOTE — Telephone Encounter (Signed)
Scheduled CPE for 05/19/22

## 2022-03-31 ENCOUNTER — Telehealth (INDEPENDENT_AMBULATORY_CARE_PROVIDER_SITE_OTHER): Payer: Self-pay

## 2022-03-31 NOTE — Telephone Encounter (Signed)
Patient has contacted me multiple times regarding getting scheduled for surgery. I checked the last office note and the patient needs a laser procedure. Messages left were sent to Tiburcio Bash to reach out to the patient to get her scheduled. I contacted the patient to make sure she understood that her messages were heard and gave her Tanya's direct number to contact.

## 2022-04-07 ENCOUNTER — Ambulatory Visit: Payer: Managed Care, Other (non HMO) | Admitting: Nurse Practitioner

## 2022-04-18 ENCOUNTER — Other Ambulatory Visit: Payer: Self-pay | Admitting: Nurse Practitioner

## 2022-04-18 ENCOUNTER — Encounter: Payer: Self-pay | Admitting: Nurse Practitioner

## 2022-04-18 DIAGNOSIS — E038 Other specified hypothyroidism: Secondary | ICD-10-CM

## 2022-04-21 ENCOUNTER — Telehealth: Payer: Self-pay

## 2022-04-21 ENCOUNTER — Other Ambulatory Visit (HOSPITAL_COMMUNITY): Payer: Self-pay

## 2022-04-21 NOTE — Telephone Encounter (Signed)
Pharmacy Patient Advocate Encounter   Received notification that prior authorization for Ventolin HFA is required/requested.   PA submitted on 04/21/22 to (ins) Advertising copywriter via [employer] or (Medicaid) confirmation # [id number] Status is pending

## 2022-04-21 NOTE — Telephone Encounter (Signed)
Patient Advocate Encounter  Prior Authorization for Ventolin HFA 108 (90 Base)MCG/ACT aerosol has been approved.    PA# [id number] Effective dates: 04/21/22 through 04/21/23

## 2022-04-21 NOTE — Telephone Encounter (Signed)
Noted  

## 2022-04-22 NOTE — Telephone Encounter (Signed)
Pt informed that medication was approve.

## 2022-04-30 NOTE — Progress Notes (Signed)
    MRN : [id number]  Debra Adkins is a 62 y.o. ([birth year]) female who presents with chief complaint of No chief complaint on file. .    The patient's right lower extremity was sterilely prepped and draped.  The ultrasound machine was used to visualize the right small saphenous vein throughout its course.  A segment at the level of the midcalf was selected for access.  The saphenous vein was accessed without difficulty using ultrasound guidance with a micropuncture needle.   An 0.018  wire was placed beyond the saphenofemoral junction through the sheath and the microneedle was removed.  The 65 cm sheath was then placed over the wire and the wire and dilator were removed.  The laser fiber was placed through the sheath and its tip was placed approximately 2 cm below the saphenopopliteal junction.  Tumescent anesthesia was then created with a dilute lidocaine solution.  Laser energy was then delivered with constant withdrawal of the sheath and laser fiber.  Approximately 928 Joules of energy were delivered over a length of 14 cm.  Sterile dressings were placed.  The patient tolerated the procedure well without complications.

## 2022-05-01 ENCOUNTER — Encounter (INDEPENDENT_AMBULATORY_CARE_PROVIDER_SITE_OTHER): Payer: Self-pay | Admitting: Vascular Surgery

## 2022-05-01 ENCOUNTER — Ambulatory Visit (INDEPENDENT_AMBULATORY_CARE_PROVIDER_SITE_OTHER): Payer: 59 | Admitting: Vascular Surgery

## 2022-05-01 VITALS — BP 130/81 | HR 71 | Resp 16 | Wt 171.6 lb

## 2022-05-01 DIAGNOSIS — I83813 Varicose veins of bilateral lower extremities with pain: Secondary | ICD-10-CM

## 2022-05-01 NOTE — Telephone Encounter (Signed)
Will call the pharmacy and see what's going on. It was approved until 04/21/2023.

## 2022-05-01 NOTE — Telephone Encounter (Signed)
Can we see if it requires a PA please

## 2022-05-02 ENCOUNTER — Other Ambulatory Visit (HOSPITAL_COMMUNITY): Payer: Self-pay

## 2022-05-02 NOTE — Telephone Encounter (Signed)
Per test claim, refill is too soon for ventolin hfa inhaler, medication was previously filled on 04/30/2022 for 18 g quantity. Medication will be eligible for refill 05/14/2022

## 2022-05-04 ENCOUNTER — Encounter (INDEPENDENT_AMBULATORY_CARE_PROVIDER_SITE_OTHER): Payer: Self-pay | Admitting: Vascular Surgery

## 2022-05-05 NOTE — Telephone Encounter (Signed)
Call the pharmacy and clarify which inhaler they filled

## 2022-05-06 ENCOUNTER — Other Ambulatory Visit (INDEPENDENT_AMBULATORY_CARE_PROVIDER_SITE_OTHER): Payer: Self-pay | Admitting: Vascular Surgery

## 2022-05-06 DIAGNOSIS — I83813 Varicose veins of bilateral lower extremities with pain: Secondary | ICD-10-CM

## 2022-05-06 NOTE — Telephone Encounter (Signed)
Spoke to pharmacist at [facility] and was advised that patient got filled Ventolin inhal[facility] at [facility] [facility]/30/22.

## 2022-05-07 ENCOUNTER — Ambulatory Visit (INDEPENDENT_AMBULATORY_CARE_PROVIDER_SITE_OTHER): Payer: Managed Care, Other (non HMO)

## 2022-05-07 DIAGNOSIS — I83813 Varicose veins of bilateral lower extremities with pain: Secondary | ICD-10-CM | POA: Diagnosis not present

## 2022-05-07 NOTE — Telephone Encounter (Signed)
Can we call and communicate this with the patient. If there is still an issue she may need to send a picture of the inhaler that is is after to [place] or bring it by the office

## 2022-05-07 NOTE — Telephone Encounter (Signed)
Spoke to patient by telephone and was advised that there is a lot of confusion about her inhaler. Patient stated that she talked with a patient advocate at [facility] and was told that the office needs to call and get an approval on the inhaler. Patient stated that she never picked up the inhaler on 04/30/22 because of the cost. [facility] is [facility] and find out the cost of the inhaler that they have on hold for her there. Patient is going to reach out to [place] also. Patient will send a picture of her inhaler to Audria Nine NP.

## 2022-05-19 ENCOUNTER — Other Ambulatory Visit: Payer: Self-pay | Admitting: Nurse Practitioner

## 2022-05-19 ENCOUNTER — Encounter: Payer: Self-pay | Admitting: Nurse Practitioner

## 2022-05-19 ENCOUNTER — Ambulatory Visit (INDEPENDENT_AMBULATORY_CARE_PROVIDER_SITE_OTHER): Payer: Managed Care, Other (non HMO) | Admitting: Nurse Practitioner

## 2022-05-19 VITALS — BP 136/78 | HR 65 | Temp 97.9°F | Resp 16 | Ht 60.25 in | Wt 175.0 lb

## 2022-05-19 DIAGNOSIS — Z0000 Encounter for general adult medical examination without abnormal findings: Secondary | ICD-10-CM

## 2022-05-19 DIAGNOSIS — Z9889 Other specified postprocedural states: Secondary | ICD-10-CM | POA: Diagnosis not present

## 2022-05-19 DIAGNOSIS — J4540 Moderate persistent asthma, uncomplicated: Secondary | ICD-10-CM

## 2022-05-19 DIAGNOSIS — E063 Autoimmune thyroiditis: Secondary | ICD-10-CM

## 2022-05-19 DIAGNOSIS — E669 Obesity, unspecified: Secondary | ICD-10-CM

## 2022-05-19 DIAGNOSIS — E538 Deficiency of other specified B group vitamins: Secondary | ICD-10-CM | POA: Diagnosis not present

## 2022-05-19 DIAGNOSIS — M2550 Pain in unspecified joint: Secondary | ICD-10-CM | POA: Diagnosis not present

## 2022-05-19 DIAGNOSIS — F320 Major depressive disorder, single episode, mild: Secondary | ICD-10-CM | POA: Insufficient documentation

## 2022-05-19 DIAGNOSIS — E038 Other specified hypothyroidism: Secondary | ICD-10-CM

## 2022-05-19 DIAGNOSIS — I83813 Varicose veins of bilateral lower extremities with pain: Secondary | ICD-10-CM | POA: Diagnosis not present

## 2022-05-19 DIAGNOSIS — Z85020 Personal history of malignant carcinoid tumor of stomach: Secondary | ICD-10-CM | POA: Diagnosis not present

## 2022-05-19 LAB — LIPID PANEL
Cholesterol: 206 mg/dL — ABNORMAL HIGH (ref 0–200)
HDL: 84.2 mg/dL (ref >39.00)
LDL Cholesterol: 110 mg/dL — ABNORMAL HIGH (ref 0–99)
NonHDL: 121.63
Total CHOL/HDL Ratio: 2
Triglycerides: 59 mg/dL (ref 0.0–149.0)
VLDL: 11.8 mg/dL (ref 0.0–40.0)

## 2022-05-19 LAB — COMPREHENSIVE METABOLIC PANEL
ALT: 22 U/L (ref 0–35)
AST: 18 U/L (ref 0–37)
Albumin: 4.3 g/dL (ref 3.5–5.2)
Alkaline Phosphatase: 65 U/L (ref 39–117)
BUN: 10 mg/dL (ref 6–23)
CO2: 28 mEq/L (ref 19–32)
Calcium: 9.3 mg/dL (ref 8.4–10.5)
Chloride: 104 mEq/L (ref 96–112)
Creatinine, Ser: 0.81 mg/dL (ref 0.40–1.20)
GFR: 78.27 mL/min (ref >60.00)
Glucose, Bld: 92 mg/dL (ref 70–99)
Potassium: 4.2 mEq/L (ref 3.5–5.1)
Sodium: 140 mEq/L (ref 135–145)
Total Bilirubin: 0.6 mg/dL (ref 0.2–1.2)
Total Protein: 6.9 g/dL (ref 6.0–8.3)

## 2022-05-19 LAB — CBC
HCT: 45.4 % (ref 36.0–46.0)
Hemoglobin: 15.1 g/dL — ABNORMAL HIGH (ref 12.0–15.0)
MCHC: 33.3 g/dL (ref 30.0–36.0)
MCV: 94.1 fl (ref 78.0–100.0)
Platelets: 231 10*3/uL (ref 150.0–400.0)
RBC: 4.83 Mil/uL (ref 3.87–5.11)
RDW: 13.6 % (ref 11.5–15.5)
WBC: 6.7 10*3/uL (ref 4.0–10.5)

## 2022-05-19 LAB — VITAMIN B12: Vitamin B-12: 274 pg/mL (ref 211–911)

## 2022-05-19 LAB — TSH: TSH: 9.02 u[IU]/mL — ABNORMAL HIGH (ref 0.35–5.50)

## 2022-05-19 LAB — HEMOGLOBIN A1C: Hgb A1c MFr Bld: 5.4 % (ref 4.6–6.5)

## 2022-05-19 LAB — VITAMIN D 25 HYDROXY (VIT D DEFICIENCY, FRACTURES): VITD: 17.44 ng/mL — ABNORMAL LOW (ref 30.00–100.00)

## 2022-05-19 MED ORDER — FLUTICASONE-SALMETEROL 100-50 MCG/ACT IN AEPB: 1.0000 | INHALATION_SPRAY | Freq: Two times a day (BID) | RESPIRATORY_TRACT | 1 refills | Status: DC

## 2022-05-19 MED ORDER — DULOXETINE HCL 30 MG PO CPEP: 30.0000 mg | ORAL_CAPSULE | Freq: Every day | ORAL | 0 refills | Status: DC

## 2022-05-19 NOTE — Assessment & Plan Note (Signed)
Patient is followed by Dr. Adele Dan in [place] continue follow-up with specialist

## 2022-05-19 NOTE — Assessment & Plan Note (Signed)
Patient having a cough using albuterol inhaler 2-3 times a week and having PND will step up asthma treatment plan will start her on Wixela patient will rinse mouth after each use continue using albuterol inhaler as needed

## 2022-05-19 NOTE — Assessment & Plan Note (Signed)
Discussed age-appropriate immunizations and screening exams.  Patient's up-to-date on her CRC screening, breast cancer screening, DEXA scan, Pap smear.  Did discuss information in regards to shingles vaccine today.  Patient was given information at discharge about preventative healthcare maintenance with anticipatory guidance

## 2022-05-19 NOTE — Assessment & Plan Note (Signed)
Pending B12 level she does give herself B12 injections.

## 2022-05-19 NOTE — Assessment & Plan Note (Signed)
Patient is being seen and followed by vascular continue follow-up with vascular as recommended

## 2022-05-19 NOTE — Patient Instructions (Signed)
Nice to see you today I will be in touch with the labs once I have reviewed them Follow up with me in 6 weeks, sooner if you need me

## 2022-05-19 NOTE — Assessment & Plan Note (Signed)
Patient has a history of the same was on sertraline in the past.  Given patient's multiple joint pains back pain and depression we will start her on duloxetine 30 mg nightly

## 2022-05-19 NOTE — Assessment & Plan Note (Signed)
Start duloxetine 30 mg.  Pending ANA and rheumatoid factor

## 2022-05-19 NOTE — Assessment & Plan Note (Signed)
History of the same pending thyroid panel today continue levothyroxine 125 mcg

## 2022-05-19 NOTE — Assessment & Plan Note (Signed)
Check CBC, B12, vitamin D

## 2022-05-19 NOTE — Progress Notes (Signed)
Established Patient Office Visit  Subjective   Patient ID: Debra Adkins, female    DOB: 31-May-1960  Age: 62 y.o. MRN: [id number]  Chief Complaint  Patient presents with   Annual Exam    HPI   Asthma: states that she has been using the albuterol inhaler. States that she is currently out of it. States that she would use it 2-3 times a week. Qvar. Waking up at night gasping for air .  Patient has gotten Ventolin inhaler with a blue casing around the past that seems to work better than the Ventolin inhaler with red casing.  Informed her to take the picture inhalers to the pharmacy this could be a manufacturing issue.  Hypothyroidism: states that she is on levothyroxine status post thyroidectomy.  Tolerates medication well per her report   for complete physical and follow up of chronic conditions.  Depression: states that she has been feeling sad. States that she has not been having any thoughts of harming her self. States that since covid and then since she has gotten covid again and have a cough since.   Immunizations: -Tetanus: Up to date  -Influenza: out of season  -Shingles: Discussed in office -Pneumonia: pna back in 4-5 years Covid: refused  Diet: Fair diet. Some days nothing much states 3 at the max.States that she does coffee water soda and wine. 2-3 glasses a day some days none at all  Exercise: No regular exercise.  Due to the patient's multiple joint pains  Eye exam: . Hx of cateract surgery.  Sees them as needed Dental exam: needs updating     Colonoscopy: Dr T with [facility]. States that Leitha Schuller is the office visit then  the week after she has the procedure  Lung Cancer Screening: Does not qualify  Pap smear: hystrectomy Mammogran: 06/18/2021 repeat in 1 year Dexa: WNL 06/18/2021       05/19/2022    9:38 AM 05/15/2021   12:34 PM 05/02/2020   11:42 AM  PHQ9 SCORE ONLY  PHQ-9 Total Score 10 8 6        05/19/2022    9:39 AM 05/15/2021   12:35 PM  GAD 7 :  Generalized Anxiety Score  Nervous, Anxious, on Edge 1 0  Control/stop worrying 1 0  Worry too much - different things 1 0  Trouble relaxing 1 2  Restless 1 1  Easily annoyed or irritable 1 0  Afraid - awful might happen 0 0  Total GAD 7 Score 6 3  Anxiety Difficulty Very difficult         Review of Systems  Constitutional:  Negative for chills and fever.  Respiratory:  Positive for cough. Negative for shortness of breath.   Cardiovascular:  Negative for chest pain and leg swelling.  Gastrointestinal:  Positive for diarrhea. Negative for abdominal pain, blood in stool, constipation, nausea and vomiting.  Genitourinary:  Negative for dysuria and hematuria.  Musculoskeletal:  Positive for back pain and joint pain.  Neurological:  Negative for tingling and headaches.  Psychiatric/Behavioral:  Negative for hallucinations and suicidal ideas.       Objective:     BP 136/78   Pulse 65   Temp 97.9 F (36.6 C)   Resp 16   Ht 5' 0.25" (1.53 m)   Wt 175 lb (79.4 kg)   SpO2 98%   BMI 33.89 kg/m    Wt Readings from Last 3 Encounters:  05/19/22 175 lb (79.4 kg)  05/01/22 171 lb 9.6 oz (  77.8 kg)  01/24/22 174 lb 2.6 oz (79 kg)    BP Readings from Last 3 Encounters:  05/19/22 136/78  05/01/22 130/81  03/20/22 (!) 160/81     Physical Exam Vitals and nursing note reviewed.  Constitutional:      Appearance: Normal appearance.  HENT:     Right Ear: Tympanic membrane, ear canal and external ear normal.     Left Ear: Tympanic membrane, ear canal and external ear normal.     Mouth/Throat:     Mouth: Mucous membranes are moist.     Pharynx: Oropharynx is clear.  Eyes:     Extraocular Movements: Extraocular movements intact.     Pupils: Pupils are equal, round, and reactive to light.  Cardiovascular:     Rate and Rhythm: Normal rate and regular rhythm.     Pulses: Normal pulses.     Heart sounds: Normal heart sounds.  Pulmonary:     Effort: Pulmonary effort is normal.      Breath sounds: Normal breath sounds.  Abdominal:     General: Bowel sounds are normal. There is no distension.     Palpations: There is no mass.     Tenderness: There is no abdominal tenderness.     Hernia: No hernia is present.  Musculoskeletal:     Right lower leg: No edema.     Left lower leg: No edema.  Lymphadenopathy:     Cervical: No cervical adenopathy.  Skin:    General: Skin is warm.  Neurological:     General: No focal deficit present.     Mental Status: She is alert.     Deep Tendon Reflexes:     Reflex Scores:      Bicep reflexes are 2+ on the right side and 2+ on the left side.      Patellar reflexes are 2+ on the right side and 2+ on the left side.    Comments: Bilateral upper and lower extremity strength 5/5  Psychiatric:        Mood and Affect: Mood normal.        Behavior: Behavior normal.        Thought Content: Thought content normal.        Judgment: Judgment normal.      No results found for any visits on 05/19/22.    The ASCVD Risk score (Arnett DK, et al., 2019) failed to calculate for the following reasons:   The valid HDL cholesterol range is 20 to 100 mg/dL    Assessment & Plan:   Problem List Items Addressed This Visit       Cardiovascular and Mediastinum   Varicose veins of both lower extremities with pain    Patient is being seen and followed by vascular continue follow-up with vascular as recommended        Respiratory   Moderate persistent asthma without complication    Patient having a cough using albuterol inhaler 2-3 times a week and having PND will step up asthma treatment plan will start her on Wixela patient will rinse mouth after each use continue using albuterol inhaler as needed      Relevant Medications   fluticasone-salmeterol (WIXELA INHUB) 100-50 MCG/ACT AEPB     Endocrine   Hypothyroidism due to Hashimoto's thyroiditis    History of the same pending thyroid panel today continue levothyroxine 125 mcg         Other   B12 deficiency    Pending B12 level she does  give herself B12 injections.      Relevant Orders   Vitamin B12   History of gastric surgery    Check CBC, B12, vitamin D      Relevant Orders   VITAMIN D 25 Hydroxy (Vit-D Deficiency, Fractures)   Vitamin B12   History of malignant carcinoid tumor of stomach    Patient is followed by Dr. Adele Dan in [place] continue follow-up with specialist      Preventative health care    Discussed age-appropriate immunizations and screening exams.  Patient's up-to-date on her CRC screening, breast cancer screening, DEXA scan, Pap smear.  Did discuss information in regards to shingles vaccine today.  Patient was given information at discharge about preventative healthcare maintenance with anticipatory guidance      Relevant Orders   CBC   Comprehensive metabolic panel   TSH   Depression, major, single episode, mild (HCC)    Patient has a history of the same was on sertraline in the past.  Given patient's multiple joint pains back pain and depression we will start her on duloxetine 30 mg nightly      Relevant Medications   DULoxetine (CYMBALTA) 30 MG capsule   Multiple joint pain - Primary    Start duloxetine 30 mg.  Pending ANA and rheumatoid factor      Relevant Medications   DULoxetine (CYMBALTA) 30 MG capsule   Other Relevant Orders   ANA Screen,IFA,Reflex Titer/Pattern,Reflex Mplx 11 Ab Cascade with IdentRA   Rheumatoid factor   Other Visit Diagnoses     Obesity (BMI 30-39.9)       Relevant Orders   Hemoglobin A1c   Lipid panel       Return in about 6 weeks (around 06/30/2022) for inhaler/MDD recheck .    Audria Nine, NP

## 2022-05-20 ENCOUNTER — Other Ambulatory Visit (INDEPENDENT_AMBULATORY_CARE_PROVIDER_SITE_OTHER): Payer: Managed Care, Other (non HMO)

## 2022-05-20 DIAGNOSIS — E039 Hypothyroidism, unspecified: Secondary | ICD-10-CM

## 2022-05-20 LAB — T3, FREE: T3, Free: 2.4 pg/mL (ref 2.3–4.2)

## 2022-05-20 LAB — T4, FREE: Free T4: 0.85 ng/dL (ref 0.60–1.60)

## 2022-05-21 ENCOUNTER — Other Ambulatory Visit: Payer: Self-pay | Admitting: Nurse Practitioner

## 2022-05-21 DIAGNOSIS — E559 Vitamin D deficiency, unspecified: Secondary | ICD-10-CM

## 2022-05-21 MED ORDER — VITAMIN D (ERGOCALCIFEROL) 1.25 MG (50000 UNIT) PO CAPS: 50000.0000 [IU] | ORAL_CAPSULE | ORAL | 0 refills | Status: DC

## 2022-05-22 ENCOUNTER — Ambulatory Visit (INDEPENDENT_AMBULATORY_CARE_PROVIDER_SITE_OTHER): Payer: Managed Care, Other (non HMO) | Admitting: Vascular Surgery

## 2022-05-22 ENCOUNTER — Encounter (INDEPENDENT_AMBULATORY_CARE_PROVIDER_SITE_OTHER): Payer: Self-pay | Admitting: Vascular Surgery

## 2022-05-22 VITALS — BP 136/86 | HR 79 | Resp 16 | Wt 173.4 lb

## 2022-05-22 DIAGNOSIS — I872 Venous insufficiency (chronic) (peripheral): Secondary | ICD-10-CM

## 2022-05-22 DIAGNOSIS — I83813 Varicose veins of bilateral lower extremities with pain: Secondary | ICD-10-CM | POA: Diagnosis not present

## 2022-05-22 NOTE — Progress Notes (Signed)
MRN : [id number]  Debra Adkins is a 62 y.o. (1961/01/12) female who presents with chief complaint of varicose veins hurt.  History of Present Illness:   The patient returns to the office for followup status post laser ablation of the right great saphenous vein on 05/01/2022. The patient notes multiple residual varicosities bilaterally which continued to hurt with dependent positions and remained tender to palpation. The patient's swelling is unchanged from preoperative status. The patient continues to wear graduated compression stockings on a daily basis but these are not eliminating the pain and discomfort. The patient continues to use over-the-counter anti-inflammatory medications to treat the pain and related symptoms but this has not given the patient relief. The patient notes the pain in the lower extremities is causing problems with daily exercise, problems at work and even with household activities such as preparing meals and doing dishes.  The patient is otherwise done well and there have been no complications related to the laser procedure or interval changes in the patient's overall   Venous ultrasound post laser shows successful laser ablation of the right great saphenous, no DVT identified.  No outpatient medications have been marked as taking for the 05/22/22 encounter (Appointment) with Gilda Crease, Latina Craver, MD.    Past Medical History:  Diagnosis Date   Allergy    Asthma    Blood transfusion without reported diagnosis    Carcinoid tumor of colon    Followed by Dr. Remi Haggard in [place]   Chicken pox    Constipation    Diverticulosis    Hypertension    Thyroid disease    hoshimotos's thyroiditis at age 9    Past Surgical History:  Procedure Laterality Date   ABDOMINAL HYSTERECTOMY     states took everything but cervix?   DG GALL BLADDER     LAPAROSCOPIC GASTRIC SLEEVE RESECTION     TENNIS ELBOW RELEASE/NIRSCHEL PROCEDURE Left 01/24/2022   Procedure: LEFT  LATERAL EPICONDYLE DEBRIDEMENT, REPAIR AS INIDICATED;  Surgeon: Tarry Kos, MD;  Location: [facility];  Service: Orthopedics;  Laterality: Left;   THYROIDECTOMY      Social History Social History   Tobacco Use   Smoking status: Former    Packs/day: 0.25    Years: 20.00    Additional pack years: 0.00    Total pack years: 5.00    Types: Cigarettes    Quit date: 01/20/2005    Years since quitting: 17.3   Smokeless tobacco: Never  Vaping Use   Vaping Use: Never used  Substance Use Topics   Alcohol use: Yes    Comment: 2-4 glasses of wine, 8-10 in a week   Drug use: Never    Family History Family History  Problem Relation Age of Onset   Heart disease Father    Hyperlipidemia Father    Other Father        Heart transplant   Diabetes Brother    Heart disease Brother    Testicular cancer Brother    Heart disease Brother    Heart attack Brother     Allergies  Allergen Reactions   Chocolate Hives and Itching   Codeine Rash    nausea     REVIEW OF SYSTEMS (Negative unless checked)  Constitutional: [] Weight loss  [] Fever  [] Chills Cardiac: [] Chest pain   [] Chest pressure   [] Palpitations   [] Shortness of breath when laying flat   [] Shortness of breath with exertion. Vascular:  [] Pain in legs with walking   [  x]Pain in legs with standing  [] History of DVT   [] Phlebitis   [] Swelling in legs   [x] Varicose veins   [] Non-healing ulcers Pulmonary:   [] Uses home oxygen   [] Productive cough   [] Hemoptysis   [] Wheeze  [] COPD   [] Asthma Neurologic:  [] Dizziness   [] Seizures   [] History of stroke   [] History of TIA  [] Aphasia   [] Vissual changes   [] Weakness or numbness in arm   [] Weakness or numbness in leg Musculoskeletal:   [] Joint swelling   [] Joint pain   [] Low back pain Hematologic:  [] Easy bruising  [] Easy bleeding   [] Hypercoagulable state   [] Anemic Gastrointestinal:  [] Diarrhea   [] Vomiting  [] Gastroesophageal reflux/heartburn   [] Difficulty  swallowing. Genitourinary:  [] Chronic kidney disease   [] Difficult urination  [] Frequent urination   [] Blood in urine Skin:  [] Rashes   [] Ulcers  Psychological:  [] History of anxiety   []  History of major depression.  Physical Examination  There were no vitals filed for this visit. There is no height or weight on file to calculate BMI. Gen: WD/WN, NAD Head: [place]/AT, No temporalis wasting.  Ear/Nose/Throat: Hearing grossly intact, nares w/o erythema or drainage, pinna without lesions Eyes: PER, EOMI, sclera nonicteric.  Neck: Supple, no gross masses.  No JVD.  Pulmonary:  Good air movement, no audible wheezing, no use of accessory muscles.  Cardiac: RRR, precordium not hyperdynamic. Vascular:  Large varicosities present, greater than 10 mm right leg.  Veins are tender to palpation  Mild venous stasis changes to the legs bilaterally.  Trace soft pitting edema CEAP C3sEpAsPr Vessel Right Left  Radial Palpable Palpable  Gastrointestinal: soft, non-distended. No guarding/no peritoneal signs.  Musculoskeletal: M/S 5/5 throughout.  No deformity.  Neurologic: CN 2-12 intact. Pain and light touch intact in extremities.  Symmetrical.  Speech is fluent. Motor exam as listed above. Psychiatric: Judgment intact, Mood & affect appropriate for pt's clinical situation. Dermatologic: Venous rashes no ulcers noted.  No changes consistent with cellulitis. Lymph : No lichenification or skin changes of chronic lymphedema.  CBC Lab Results  Component Value Date   WBC 6.7 05/19/2022   HGB 15.1 (H) 05/19/2022   HCT 45.4 05/19/2022   MCV 94.1 05/19/2022   PLT 231.0 05/19/2022    BMET    Component Value Date/Time   NA 140 05/19/2022 1026   K 4.2 05/19/2022 1026   CL 104 05/19/2022 1026   CO2 28 05/19/2022 1026   GLUCOSE 92 05/19/2022 1026   BUN 10 05/19/2022 1026   CREATININE 0.81 05/19/2022 1026   CALCIUM 9.3 05/19/2022 1026   Estimated Creatinine Clearance: 68.4 mL/min (by C-G formula based on  SCr of 0.81 mg/dL).  COAG No results found for: "INR", "PROTIME"  Radiology VAS [place] LOWER EXTREMITY VENOUS POST ABLATION  Result Date: 05/19/2022  Lower Venous Reflux Study Patient Name:  Debra Adkins  Date of Exam:   05/07/2022 Medical Rec #: [id number]     Accession #:    [id number] Date of Birth: 1960/08/24     Patient Gender: F Patient Age:   33 years Exam Location:  [facility] Procedure:      VAS [place] LOWER EXTREMITY VENOUS POST ABLATION Referring Phys: Lucetta Baehr --------------------------------------------------------------------------------  Indications: Rt leg reflux LSV. Other Indications: S/p laser ablation of rt LSV. Performing Technologist: Salvadore Farber RVT  Examination Guidelines: A complete evaluation includes B-mode imaging, spectral Doppler, color Doppler, and power Doppler as needed of all accessible portions of each vessel. Bilateral testing is  considered an integral part of a complete examination. Limited examinations for reoccurring indications may be performed as noted. The reflux portion of the exam is performed with the patient in reverse Trendelenburg. Significant venous reflux is defined as >500 ms in the superficial venous system, and >1 second in the deep venous system.   Summary: Right: - No evidence of deep vein thrombosis seen in the right lower extremity, from the common femoral through the popliteal veins. - LSV closed s/p ablation  *See table(s) above for measurements and observations. Electronically signed by Levora Dredge MD on 05/19/2022 at 9:02:34 AM.    Final      Assessment/Plan 1. Varicose veins of both lower extremities with pain Recommend:  The patient has had successful ablation of the previously incompetent saphenous venous system but still has persistent symptoms of pain and swelling that are having a negative impact on daily life and daily activities.  Patient should undergo injection sclerotherapy to treat the residual  varicosities.  The risks, benefits and alternative therapies were reviewed in detail with the patient.  All questions were answered.  The patient agrees to proceed with sclerotherapy at their convenience.  The patient will continue wearing the graduated compression stockings and using the over-the-counter pain medications to treat her symptoms.      2. Chronic venous insufficiency No surgery or intervention at this point in time.   The patient is CEAP C4sEpAsPr   I have discussed with the patient venous insufficiency and why it  causes symptoms. I have discussed with the patient the chronic skin changes that accompany venous insufficiency and the long term sequela such as infection and ulceration.  Patient will begin wearing graduated compression stockings or compression wraps on a daily basis.  The patient will put the compression on first thing in the morning and removing them in the evening. The patient is instructed specifically not to sleep in the compression.    In addition, behavioral modification including several periods of elevation of the lower extremities during the day will be continued. I have demonstrated that proper elevation is a position with the ankles at heart level.  The patient is instructed to begin routine exercise, especially walking on a daily basis      Levora Dredge, MD  05/22/2022 8:49 AM

## 2022-05-24 ENCOUNTER — Encounter (INDEPENDENT_AMBULATORY_CARE_PROVIDER_SITE_OTHER): Payer: Self-pay | Admitting: Vascular Surgery

## 2022-05-24 LAB — ANA SCREEN,IFA,REFLEX TITER/PATTERN,REFLEX MPLX 11 AB CASCADE
Anti Nuclear Antibody (ANA): POSITIVE — AB
Cyclic Citrullin Peptide Ab: <16 UNITS
MUTATED CITRULLINATED VIMENTIN (MCV) AB: <20 U/mL (ref <20)
Rheumatoid fact SerPl-aCnc: <10 IU/mL (ref <14)

## 2022-05-24 LAB — TIER 1
Chromatin (Nucleosomal) Antibody: <1 AI
ENA SM Ab Ser-aCnc: <1 AI
Ribonucleic Protein(ENA) Antibody, IgG: <1 AI
SM/RNP: <1 AI
ds DNA Ab: 4 IU/mL

## 2022-05-24 LAB — TIER 2
Jo-1 Autoabs: <1 AI
SSA (Ro) (ENA) Antibody, IgG: <1 AI
SSB (La) (ENA) Antibody, IgG: <1 AI
Scleroderma (Scl-70) (ENA) Antibody, IgG: <1 AI

## 2022-05-24 LAB — TIER 3
Centromere Ab Screen: <1 AI
Ribosomal P Protein Ab: <1 AI

## 2022-05-24 LAB — ANTI-NUCLEAR AB-TITER (ANA TITER): ANA Titer 1: 1:80 {titer} — ABNORMAL HIGH

## 2022-05-24 LAB — INTERPRETATION

## 2022-05-26 ENCOUNTER — Encounter: Payer: Self-pay | Admitting: Nurse Practitioner

## 2022-05-26 DIAGNOSIS — R768 Other specified abnormal immunological findings in serum: Secondary | ICD-10-CM

## 2022-05-29 ENCOUNTER — Ambulatory Visit (INDEPENDENT_AMBULATORY_CARE_PROVIDER_SITE_OTHER): Payer: 59 | Admitting: Vascular Surgery

## 2022-06-11 ENCOUNTER — Ambulatory Visit (INDEPENDENT_AMBULATORY_CARE_PROVIDER_SITE_OTHER): Payer: Managed Care, Other (non HMO) | Admitting: Nurse Practitioner

## 2022-06-11 ENCOUNTER — Encounter (INDEPENDENT_AMBULATORY_CARE_PROVIDER_SITE_OTHER): Payer: Self-pay | Admitting: Nurse Practitioner

## 2022-06-11 VITALS — BP 120/79 | HR 90 | Resp 18 | Ht 60.0 in | Wt 170.7 lb

## 2022-06-11 DIAGNOSIS — I83813 Varicose veins of bilateral lower extremities with pain: Secondary | ICD-10-CM

## 2022-06-11 NOTE — Progress Notes (Signed)
Varicose veins of bilateral lower extremity with inflammation (454.1  I83.10) Current Plans   Indication: Patient presents with symptomatic varicose veins of the lateral lower extremity.   Procedure: Sclerotherapy using hypertonic saline mixed with 1% Lidocaine was performed on the bilateral lower extremity. Compression wraps were placed. The patient tolerated the procedure well.

## 2022-06-20 ENCOUNTER — Other Ambulatory Visit: Payer: Self-pay | Admitting: Nurse Practitioner

## 2022-06-20 DIAGNOSIS — J4540 Moderate persistent asthma, uncomplicated: Secondary | ICD-10-CM

## 2022-07-08 ENCOUNTER — Ambulatory Visit (INDEPENDENT_AMBULATORY_CARE_PROVIDER_SITE_OTHER): Payer: Managed Care, Other (non HMO) | Admitting: Nurse Practitioner

## 2022-07-08 ENCOUNTER — Encounter (INDEPENDENT_AMBULATORY_CARE_PROVIDER_SITE_OTHER): Payer: Self-pay | Admitting: Nurse Practitioner

## 2022-07-08 VITALS — BP 138/89 | HR 83 | Resp 16 | Wt 172.0 lb

## 2022-07-08 DIAGNOSIS — I83813 Varicose veins of bilateral lower extremities with pain: Secondary | ICD-10-CM | POA: Diagnosis not present

## 2022-07-08 NOTE — Progress Notes (Signed)
Varicose veins of bilateral  lower extremity with inflammation (454.1  I83.10) Current Plans   Indication: Patient presents with symptomatic varicose veins of the bilateral  lower extremity.   Procedure: Sclerotherapy using hypertonic saline mixed with 1% Lidocaine was performed on the bilateral lower extremity. Compression wraps were placed. The patient tolerated the procedure well. 

## 2022-07-09 ENCOUNTER — Other Ambulatory Visit: Payer: Self-pay | Admitting: Nurse Practitioner

## 2022-07-09 DIAGNOSIS — E559 Vitamin D deficiency, unspecified: Secondary | ICD-10-CM

## 2022-07-25 ENCOUNTER — Other Ambulatory Visit: Payer: Self-pay | Admitting: Nurse Practitioner

## 2022-07-25 DIAGNOSIS — E038 Other specified hypothyroidism: Secondary | ICD-10-CM

## 2022-07-28 ENCOUNTER — Other Ambulatory Visit: Payer: Self-pay | Admitting: Nurse Practitioner

## 2022-07-28 DIAGNOSIS — E559 Vitamin D deficiency, unspecified: Secondary | ICD-10-CM

## 2022-07-28 MED ORDER — VITAMIN D (ERGOCALCIFEROL) 1.25 MG (50000 UNIT) PO CAPS
50000.00 [IU] | ORAL_CAPSULE | ORAL | 0 refills | Status: AC
Start: 2022-07-28 — End: ?

## 2022-08-05 ENCOUNTER — Ambulatory Visit: Payer: Managed Care, Other (non HMO) | Admitting: Nurse Practitioner

## 2022-08-05 VITALS — BP 134/76 | HR 88 | Temp 98.2°F | Ht 60.0 in | Wt 175.0 lb

## 2022-08-05 DIAGNOSIS — W19XXXA Unspecified fall, initial encounter: Secondary | ICD-10-CM

## 2022-08-05 DIAGNOSIS — F320 Major depressive disorder, single episode, mild: Secondary | ICD-10-CM

## 2022-08-05 DIAGNOSIS — E559 Vitamin D deficiency, unspecified: Secondary | ICD-10-CM

## 2022-08-05 DIAGNOSIS — E669 Obesity, unspecified: Secondary | ICD-10-CM | POA: Diagnosis not present

## 2022-08-05 DIAGNOSIS — Z23 Encounter for immunization: Secondary | ICD-10-CM

## 2022-08-05 DIAGNOSIS — L989 Disorder of the skin and subcutaneous tissue, unspecified: Secondary | ICD-10-CM

## 2022-08-05 DIAGNOSIS — E038 Other specified hypothyroidism: Secondary | ICD-10-CM

## 2022-08-05 DIAGNOSIS — E063 Autoimmune thyroiditis: Secondary | ICD-10-CM | POA: Diagnosis not present

## 2022-08-05 DIAGNOSIS — Z1159 Encounter for screening for other viral diseases: Secondary | ICD-10-CM

## 2022-08-05 DIAGNOSIS — Z114 Encounter for screening for human immunodeficiency virus [HIV]: Secondary | ICD-10-CM

## 2022-08-05 LAB — VITAMIN D 25 HYDROXY (VIT D DEFICIENCY, FRACTURES): VITD: 32.47 ng/mL (ref 30.00–100.00)

## 2022-08-05 LAB — TSH: TSH: 0.96 u[IU]/mL (ref 0.35–5.50)

## 2022-08-05 MED ORDER — SERTRALINE HCL 50 MG PO TABS
ORAL_TABLET | ORAL | 0 refills | Status: DC
Start: 2022-08-05 — End: 2022-09-19

## 2022-08-05 MED ORDER — CLOBETASOL PROPIONATE 0.05 % EX CREA
1.0000 | TOPICAL_CREAM | Freq: Two times a day (BID) | CUTANEOUS | 0 refills | Status: AC
Start: 2022-08-05 — End: ?

## 2022-08-05 NOTE — Assessment & Plan Note (Signed)
Patient was on duloxetine 30 mg without great benefit.  Will discontinue duloxetine 30 mg start patient on sertraline 25 mg nightly for 2 weeks then titrate to 50 mg daily daily.  Ambulatory referral to therapy also placed today.

## 2022-08-05 NOTE — Assessment & Plan Note (Signed)
Patient has finished 3 months of prescription vitamin D.  Pending vitamin D level today

## 2022-08-05 NOTE — Assessment & Plan Note (Signed)
Patient had a mechanical fall resulting in abrasions.  These were cleaned with saline and gauze and Neosporin applied in office.  Updated patient's tetanus vaccine today.

## 2022-08-05 NOTE — Assessment & Plan Note (Signed)
Patient currently maintained on levothyroxine 125 mcg daily.  At last office visit check TSH which was abnormal at approximately 9 with T3-T4 being normal pending TSH today if TSH abnormal consider increasing dose to 137 mcg

## 2022-08-05 NOTE — Patient Instructions (Addendum)
Nice to see you today We updated your tetanus vaccine today Zepbound and Reginal Lutes are the two weight loss medications I have referred you to therapy Follow up in 2 months virtually for a recheck

## 2022-08-05 NOTE — Progress Notes (Signed)
Acute Office Visit  Subjective:     Patient ID: Debra Adkins, female    DOB: 09-Apr-1960, 62 y.o.   MRN: [id number]  Chief Complaint  Patient presents with   Obesity    Medication not working    Fall    Today in gravel right knee pain     Fall Pertinent negatives include no fever, headaches or loss of consciousness.   Patient is in today for Fall  States that she fell today prior to office visit. States that that her left hip started hurting and then she fell and struck her right knee, right hand and left fore arm. Denies LOC or striking her head    Depression: states that she has not noticed an improvement in her mood and or joint pain. States that she is having more joint pain. States that she does have a appt with Rheum but not for another 2 months        05/19/2022    9:38 AM 05/15/2021   12:34 PM 05/02/2020   11:42 AM  PHQ9 SCORE ONLY  PHQ-9 Total Score 10 8 6        05/19/2022    9:39 AM 05/15/2021   12:35 PM  GAD 7 : Generalized Anxiety Score  Nervous, Anxious, on Edge 1 0  Control/stop worrying 1 0  Worry too much - different things 1 0  Trouble relaxing 1 2  Restless 1 1  Easily annoyed or irritable 1 0  Afraid - awful might happen 0 0  Total GAD 7 Score 6 3  Anxiety Difficulty Very difficult      Review of Systems  Constitutional:  Negative for chills and fever.  Respiratory:  Negative for shortness of breath.   Cardiovascular:  Negative for chest pain.  Musculoskeletal:  Positive for falls and joint pain.  Skin:  Positive for rash.  Neurological:  Negative for loss of consciousness, weakness and headaches.  Psychiatric/Behavioral:  Negative for hallucinations and suicidal ideas. The patient does not have insomnia.         Objective:    BP 134/76   Pulse 88   Temp 98.2 F (36.8 C) (Temporal)   Ht 5' (1.524 m)   Wt 175 lb (79.4 kg)   SpO2 98%   BMI 34.18 kg/m  BP Readings from Last 3 Encounters:  08/05/22 134/76  07/08/22 138/89   06/11/22 120/79   Wt Readings from Last 3 Encounters:  08/05/22 175 lb (79.4 kg)  07/08/22 172 lb (78 kg)  06/11/22 170 lb 11.2 oz (77.4 kg)      Physical Exam Vitals and nursing note reviewed.  Constitutional:      Appearance: Normal appearance.  Cardiovascular:     Rate and Rhythm: Normal rate and regular rhythm.     Heart sounds: Normal heart sounds.  Pulmonary:     Effort: Pulmonary effort is normal.     Breath sounds: Normal breath sounds.  Skin:    Findings: Lesion present.       Neurological:     Mental Status: She is alert.     No results found for any visits on 08/05/22.      Assessment & Plan:   Problem List Items Addressed This Visit       Endocrine   Hypothyroidism due to Hashimoto's thyroiditis    Patient currently maintained on levothyroxine 125 mcg daily.  At last office visit check TSH which was abnormal at approximately 9 with T3-T4 being normal pending  TSH today if TSH abnormal consider increasing dose to 137 mcg      Relevant Orders   TSH     Musculoskeletal and Integument   Skin lesion    Patient has recurrent lesion to the right anterior medial shin patient uses clobetasol refill provided today.  Topical steroid precautions were reviewed with patient      Relevant Medications   clobetasol cream (TEMOVATE) 0.05 %     Other   Depression, major, single episode, mild (HCC)    Patient was on duloxetine 30 mg without great benefit.  Will discontinue duloxetine 30 mg start patient on sertraline 25 mg nightly for 2 weeks then titrate to 50 mg daily daily.  Ambulatory referral to therapy also placed today.      Relevant Medications   sertraline (ZOLOFT) 50 MG tablet   Other Relevant Orders   Ambulatory referral to Psychology   Fall - Primary    Patient had a mechanical fall resulting in abrasions.  These were cleaned with saline and gauze and Neosporin applied in office.  Updated patient's tetanus vaccine today.      Relevant Orders    Tdap vaccine greater than or equal to 7yo IM (Completed)   Obesity (BMI 30-39.9)    Patient is curious in medically assisted weight loss.  Gave her information of the 2 injectables to call and see if they are covered under insurance.  Patient does have HLD and obesity.      Vitamin D deficiency    Patient has finished 3 months of prescription vitamin D.  Pending vitamin D level today      Relevant Orders   VITAMIN D 25 Hydroxy (Vit-D Deficiency, Fractures)   Other Visit Diagnoses     Encounter for HIV (human immunodeficiency virus) test       Relevant Orders   HIV antibody (with reflex)   Encounter for hepatitis C screening test for low risk patient       Relevant Orders   Hepatitis C Antibody   Need for tetanus, diphtheria, and acellular pertussis (Tdap) vaccine       Relevant Orders   Tdap vaccine greater than or equal to 7yo IM (Completed)       Meds ordered this encounter  Medications   clobetasol cream (TEMOVATE) 0.05 %    Sig: Apply 1 Application topically 2 (two) times daily.    Dispense:  30 g    Refill:  0    Order Specific Question:   Supervising Provider    Answer:   Roxy Manns A [[id number]]   sertraline (ZOLOFT) 50 MG tablet    Sig: Take 0.5 tablets (25 mg total) by mouth daily for 15 days, THEN 1 tablet (50 mg total) daily for 15 days.    Dispense:  23 tablet    Refill:  0    Order Specific Question:   Supervising Provider    Answer:   Roxy Manns A [[id number]]    Return in about 8 weeks (around 09/30/2022) for MDD rehcheck .  Audria Nine, NP

## 2022-08-05 NOTE — Assessment & Plan Note (Signed)
Patient is curious in medically assisted weight loss.  Gave her information of the 2 injectables to call and see if they are covered under insurance.  Patient does have HLD and obesity.

## 2022-08-05 NOTE — Assessment & Plan Note (Signed)
Patient has recurrent lesion to the right anterior medial shin patient uses clobetasol refill provided today.  Topical steroid precautions were reviewed with patient

## 2022-08-06 LAB — HIV ANTIBODY (ROUTINE TESTING W REFLEX): HIV 1&2 Ab, 4th Generation: NONREACTIVE

## 2022-08-06 LAB — HEPATITIS C ANTIBODY: Hepatitis C Ab: NONREACTIVE

## 2022-08-12 ENCOUNTER — Other Ambulatory Visit: Payer: Self-pay | Admitting: Nurse Practitioner

## 2022-08-12 DIAGNOSIS — E538 Deficiency of other specified B group vitamins: Secondary | ICD-10-CM

## 2022-08-12 DIAGNOSIS — Z9889 Other specified postprocedural states: Secondary | ICD-10-CM

## 2022-08-15 ENCOUNTER — Ambulatory Visit (INDEPENDENT_AMBULATORY_CARE_PROVIDER_SITE_OTHER): Payer: 59 | Admitting: Nurse Practitioner

## 2022-08-18 ENCOUNTER — Ambulatory Visit: Payer: Managed Care, Other (non HMO) | Admitting: Nurse Practitioner

## 2022-08-22 ENCOUNTER — Encounter (INDEPENDENT_AMBULATORY_CARE_PROVIDER_SITE_OTHER): Payer: Self-pay | Admitting: Nurse Practitioner

## 2022-08-22 ENCOUNTER — Ambulatory Visit (INDEPENDENT_AMBULATORY_CARE_PROVIDER_SITE_OTHER): Payer: Managed Care, Other (non HMO) | Admitting: Nurse Practitioner

## 2022-08-22 VITALS — BP 139/81 | HR 60 | Resp 16 | Wt 170.0 lb

## 2022-08-22 DIAGNOSIS — I83813 Varicose veins of bilateral lower extremities with pain: Secondary | ICD-10-CM | POA: Diagnosis not present

## 2022-08-22 NOTE — Progress Notes (Signed)
Varicose veins of bilateral  lower extremity with inflammation (454.1  I83.10) Current Plans   Indication: Patient presents with symptomatic varicose veins of the bilateral  lower extremity.   Procedure: Sclerotherapy using hypertonic saline mixed with 1% Lidocaine was performed on the bilateral lower extremity. Compression wraps were placed. The patient tolerated the procedure well. 

## 2022-09-19 ENCOUNTER — Encounter: Payer: Self-pay | Admitting: Nurse Practitioner

## 2022-09-19 DIAGNOSIS — F320 Major depressive disorder, single episode, mild: Secondary | ICD-10-CM

## 2022-09-19 MED ORDER — SERTRALINE HCL 50 MG PO TABS: 50.0000 mg | ORAL_TABLET | Freq: Every day | ORAL | 1 refills | Status: DC

## 2022-09-29 NOTE — Progress Notes (Signed)
Office Visit Note  Patient: Debra Adkins             Date of Birth: 1960/10/29           MRN: [id number]             PCP: Eden Emms, NP Referring: Eden Emms, NP Visit Date: 09/30/2022   Subjective:  New Patient (Initial Visit) (Patient states she has joint pain in her ankles, hands, arms, back. Patient states her skin hurts to the touch. )   History of Present Illness: Debra Adkins is a 62 y.o. female here for evaluation of positive ANA associated with joint pains fatigue sensitivity and rashes.  Medical history is also significant for hypothyroidism due to resection for Hashimoto's thyroiditis, gastric surgery for carcinoid tumor, moderate persistent asthma, and chronic venous insufficiency. She notices new and increased symptoms during the past 6 to 12 months.  Did not recall any specific event or medical change.  Main thing in the past few years had been a few episodes of COVID though none with severe complications.  She did sustain a fall landing on her left elbow in 2022 with associated left arm pain.  She had x-rays of the low back and the elbow last year with mild degenerative changes but not particularly significant compared to symptoms.  Also had ultrasound exam ruling out upper extremity DVT due to persistent swelling.  Subsequent elbow MRI consistent with tendinosis or partial thickness tear with associated edema.  Besides this her main joint pain and she has been in the back and at the hips.  This is limiting her walking ability previously was very active walking 5 dogs now cannot tolerate a lot due to pain in her hip also with pain and numbness in her toes.  She feels the left hip gets sharp pain anteriorly that comes and goes.  Also gets lateral hip pain bothers him trying to sleep.  Pain throughout her back from upper to lower.  She is also been experiencing a sensitivity to light touch and frequent burning painful sensation in her skin mostly in her torso and proximal upper  body.  She was prescribed Flexeril for muscle spasms in June not currently on any other prescription medication for musculoskeletal or nerve pain. Evaluation in primary care office in April showed a high TSH and low vitamin D these were corrected by follow-up in July.  Also takes B12 supplementation. Has noticed skin rash with redness at the front of the neck and sometimes across the bridge of the nose and central face.  Most often sees this after waking up individual episodes lasting minutes or up to a few hours.  Does not particularly associated with sun exposure. She notices hair and fingernail thinning and brittleness.  Has dry eyes and mouth 1 recent sore on the inside of right cheek. Fingers and toes often cold or numb without visible discoloration. Sleep quality is very poor describes several hours trying to fall asleep each night despite feeling excessively tired.  Has never had a sleep study has been told that she snores this was apparently worse when she weighed more. She does report short term memory problems and poor concentration. She has diarrhea chronically. Some episodes of dizziness not having headaches.  Labs reviewed ANA 1:80 homogenous RF neg CCP neg MCV neg   Activities of Daily Living:  Patient reports morning stiffness for 2-4 hours.   Patient Reports nocturnal pain.  Difficulty dressing/grooming: Reports Difficulty climbing stairs: Reports  Difficulty getting out of chair: Reports Difficulty using hands for taps, buttons, cutlery, and/or writing: Reports  Review of Systems  Constitutional:  Positive for fatigue.  HENT:  Positive for mouth sores and mouth dryness.   Eyes:  Positive for dryness.  Respiratory:  Positive for shortness of breath.   Cardiovascular:  Positive for chest pain and palpitations.  Gastrointestinal:  Positive for diarrhea. Negative for blood in stool and constipation.  Endocrine: Negative for increased urination.  Genitourinary:  Negative for  involuntary urination.  Musculoskeletal:  Positive for joint pain, gait problem, joint pain, joint swelling, myalgias, morning stiffness, muscle tenderness and myalgias. Negative for muscle weakness.  Skin:  Positive for color change, rash, hair loss and sensitivity to sunlight.  Allergic/Immunologic: Positive for susceptible to infections.  Neurological:  Positive for dizziness. Negative for headaches.  Hematological:  Negative for swollen glands.  Psychiatric/Behavioral:  Positive for depressed mood and sleep disturbance. The patient is not nervous/anxious.     PMFS History:  Patient Active Problem List   Diagnosis Date Noted   Other insomnia 10/17/2022   Positive ANA (antinuclear antibody) 09/30/2022   Fall 08/05/2022   Skin lesion 08/05/2022   Obesity (BMI 30-39.9) 08/05/2022   Vitamin D deficiency 08/05/2022   Depression, major, single episode, mild (HCC) 05/19/2022   Multiple joint pain 05/19/2022   Lateral epicondylitis, left elbow 08/22/2021   Chronic venous insufficiency 08/02/2021   Left arm pain 06/05/2021   Lumbar pain 05/15/2021   Left elbow pain 05/15/2021   Preventative health care 05/15/2021   Varicose veins of both lower extremities with pain 05/15/2021   Hiatal hernia 05/02/2020   History of malignant carcinoid tumor of stomach 05/02/2020   B12 deficiency 10/20/2019   History of gastric surgery 10/20/2019   Hypothyroidism due to Hashimoto's thyroiditis 10/20/2019   Moderate persistent asthma without complication 10/20/2019   COVID-19 03/17/2019    Past Medical History:  Diagnosis Date   Allergy    Asthma    Blood transfusion without reported diagnosis    Carcinoid tumor of colon    Followed by Dr. Remi Haggard in [place]   Chicken pox    Constipation    Diverticulosis    Hypertension    Thyroid disease    hoshimotos's thyroiditis at age 37    Family History  Problem Relation Age of Onset   Heart disease Father    Hyperlipidemia Father    Other  Father        Heart transplant   Diabetes Brother    Heart disease Brother    Testicular cancer Brother    Heart disease Brother    Heart attack Brother    Past Surgical History:  Procedure Laterality Date   ABDOMINAL HYSTERECTOMY     states took everything but cervix?   DG GALL BLADDER     LAPAROSCOPIC GASTRIC SLEEVE RESECTION     TENNIS ELBOW RELEASE/NIRSCHEL PROCEDURE Left 01/24/2022   Procedure: LEFT LATERAL EPICONDYLE DEBRIDEMENT, REPAIR AS INIDICATED;  Surgeon: Tarry Kos, MD;  Location: [facility];  Service: Orthopedics;  Laterality: Left;   THYROIDECTOMY     Social History   Social History Narrative   05/02/20   From: [place], but moved when she was high school   Living: with Andrey Farmer, husband (2019) but together since 2005   Work: not currently due to health issues      Family: brother in [place]      Enjoys: rescue dogs - has 5  Exercise: not currently due to joint issues   Diet: low calorie, salads      Safety   Seat belts: Yes    Guns: Yes  and secure   Safe in relationships: Yes    Immunization History  Administered Date(s) Administered   Tdap 08/05/2022     Objective: Vital Signs: BP (!) 146/87 (BP Location: Right Arm, Patient Position: Sitting, Cuff Size: Normal)   Pulse 66   Resp 14   Ht 5\' 1"  (1.549 m)   Wt 170 lb (77.1 kg)   BMI 32.12 kg/m    Physical Exam Constitutional:      Appearance: She is obese.  Eyes:     Conjunctiva/sclera: Conjunctivae normal.  Cardiovascular:     Rate and Rhythm: Normal rate and regular rhythm.  Pulmonary:     Effort: Pulmonary effort is normal.     Breath sounds: Normal breath sounds.  Musculoskeletal:     Right lower leg: No edema.     Left lower leg: No edema.  Lymphadenopathy:     Cervical: No cervical adenopathy.  Skin:    General: Skin is warm and dry.     Findings: No rash.     Comments: Normal appearing nailfold capillaries  Neurological:     Mental Status: She is alert.   Psychiatric:        Mood and Affect: Mood normal.      Musculoskeletal Exam:  Shoulders full ROM no tenderness or swelling Elbows full ROM no tenderness or swelling Wrists full ROM no tenderness or swelling Fingers full ROM no tenderness or swelling, heberdon's nodes most advanced right 2nd DIP  Diffuse paraspinal muscle tenderness throughout back, some radiation from tender points Lateral hip tenderness on both sides, internal and external rotation ROM intact Knees full ROM no tenderness or swelling Ankles full ROM mild tenderness on back of right achilles tendon insertion   Investigation: No additional findings.  Imaging: No results found.  Recent Labs: Lab Results  Component Value Date   WBC 6.7 05/19/2022   HGB 15.1 (H) 05/19/2022   PLT 231.0 05/19/2022   NA 140 05/19/2022   K 4.2 05/19/2022   CL 104 05/19/2022   CO2 28 05/19/2022   GLUCOSE 92 05/19/2022   BUN 10 05/19/2022   CREATININE 0.81 05/19/2022   BILITOT 0.6 05/19/2022   ALKPHOS 65 05/19/2022   AST 18 05/19/2022   ALT 22 05/19/2022   PROT 6.9 05/19/2022   ALBUMIN 4.3 05/19/2022   CALCIUM 9.3 05/19/2022    Speciality Comments: No specialty comments available.  Procedures:  No procedures performed Allergies: Chocolate and Codeine   Assessment / Plan:     Visit Diagnoses: Positive ANA (antinuclear antibody) - Plan: Histone antibodies, IgG, blood, Sedimentation rate, C3 and C4, C-reactive protein  Has widespread pain and sensitivity as well as concentration and short-term memory difficulty.  But lacks lower extremity involvement, headaches, or irritable bowel symptoms so picture is not entirely typical for fibromyalgia.  Does raise possibility of small fiber neuropathy or other pain syndrome.  She did have B12 deficiency related to gastric carcinoid tumor but supplementation correcting for this and vitamin D deficiency already earlier this year. Positive ANA with persistent and generalized symptoms but  no specific clinical criteria indicative for autoimmune connective tissue disease activity.  Already had a negative autoantibody reflex panel reviewed.  Will also check for antihistone antibodies possible drug-induced reaction also sed rate CRP and complements for evidence of systemic inflammation.  Other insomnia  She describes overall poor sleep quality with excessive daytime fatigue does not feel rested awakening also has history of snoring and has been worse at higher weights in the past.  If no specific finding from workup today may benefit to see neuro/sleep medicine evaluation for sleep study we will ruling out other causes.  Orders: Orders Placed This Encounter  Procedures   Histone antibodies, IgG, blood   Sedimentation rate   C3 and C4   C-reactive protein   Sed Rate [facility] [place] [facility]   No orders of the defined types were placed in this encounter.    Follow-Up Instructions: Return if symptoms worsen or fail to improve.   Fuller Plan, MD  Note - This record has been created using [employer].  Chart creation errors have been sought, but may not always  have been located. Such creation errors do not reflect on  the standard of medical care.

## 2022-09-30 ENCOUNTER — Ambulatory Visit: Payer: Managed Care, Other (non HMO) | Attending: Internal Medicine | Admitting: Internal Medicine

## 2022-09-30 ENCOUNTER — Encounter: Payer: Self-pay | Admitting: Internal Medicine

## 2022-09-30 VITALS — BP 146/87 | HR 66 | Resp 14 | Ht 61.0 in | Wt 170.0 lb

## 2022-09-30 DIAGNOSIS — R768 Other specified abnormal immunological findings in serum: Secondary | ICD-10-CM | POA: Diagnosis not present

## 2022-09-30 DIAGNOSIS — G4709 Other insomnia: Secondary | ICD-10-CM | POA: Diagnosis not present

## 2022-09-30 DIAGNOSIS — Z85020 Personal history of malignant carcinoid tumor of stomach: Secondary | ICD-10-CM

## 2022-09-30 NOTE — Patient Instructions (Signed)
I am checking an additional antibody test and several markers for evidence of systemic inflammation.  I recommend checking out the [employer] patient-centered guide for fibromyalgia and chronic pain management: https://howell-gardner.net/   Myofascial Pain Syndrome Myofascial pain syndrome: Always has tender points in the muscles that will cause pain when pressed (trigger points). The pain may come and go. Usually affects your neck, upper back, and shoulder areas. The pain often moves into your arms and hands. Fibromyalgia and myofascial pain syndrome are not the same. However, they often occur together. If you have both conditions, each can make the other worse. Both are common and can cause enough pain and fatigue to make day-to-day activities difficult. Both can be hard to diagnose because their symptoms are common in many other conditions. What are the causes? The exact causes of these conditions are not known. What increases the risk? You are more likely to develop either of these conditions if: You have a family history of the condition. You are female. You have certain triggers, such as: Spine disorders. An injury (trauma) or other physical stressors. Being under a lot of stress. Medical conditions such as osteoarthritis, rheumatoid arthritis, or lupus. What are the signs or symptoms? Myofascial pain syndrome Symptoms of myofascial pain syndrome include: Tight, ropy bands of muscle. Uncomfortable sensations in muscle areas. These may include aching, cramping, burning, numbness, tingling, and weakness. Difficulty moving certain parts of the body freely (poor range of motion). How is this diagnosed? This condition may be diagnosed by your symptoms and medical history. You will also have a physical exam. In general: Fibromyalgia is diagnosed if you have pain, fatigue, and other symptoms for more than 3 months, and symptoms cannot be explained by another  condition. Myofascial pain syndrome is diagnosed if you have trigger points in your muscles, and those trigger points are tender and cause pain elsewhere in your body (referred pain). How is this treated? Treatment for these conditions depends on the type that you have. For fibromyalgia, a healthy lifestyle is the most important treatment including aerobic and strength exercises. Different types of medicines are used to help treat pain and include: NSAIDs. Medicines for treating depression. Medicines that help control seizures. Medicines that relax the muscles. Treatment for myofascial pain syndrome includes: Pain medicines, such as NSAIDs. Cooling and stretching of muscles. Massage therapy with myofascial release technique. Trigger point injections. Treating these conditions often requires a team of health care providers. These may include: Your primary care provider. A physical therapist. Complementary health care providers, such as massage therapists or acupuncturists. A psychiatrist for cognitive behavioral therapy. Follow these instructions at home: Medicines Take over-the-counter and prescription medicines only as told by your health care provider. Ask your health care provider if the medicine prescribed to you: Requires you to avoid driving or using machinery. Can cause constipation. You may need to take these actions to prevent or treat constipation: Drink enough fluid to keep your urine pale yellow. Take over-the-counter or prescription medicines. Eat foods that are high in fiber, such as beans, whole grains, and fresh fruits and vegetables. Limit foods that are high in fat and processed sugars, such as fried or sweet foods. Lifestyle  Do exercises as told by your health care provider or physical therapist. Practice relaxation techniques to control your stress. You may want to try: Biofeedback. Visual imagery. Hypnosis. Muscle relaxation. Yoga. Meditation. Maintain a  healthy lifestyle. This includes eating a healthy diet and getting enough sleep. Do not use any  products that contain nicotine or tobacco. These products include cigarettes, chewing tobacco, and vaping devices, such as e-cigarettes. If you need help quitting, ask your health care provider. General instructions Talk to your health care provider about complementary treatments, such as acupuncture or massage. Do not do activities that stress or strain your muscles. This includes repetitive motions and heavy lifting. Keep all follow-up visits. This is important. Where to find support Consider joining a support group with others who are diagnosed with this condition. National Fibromyalgia Association: fmaware.org Where to find more information U.S. Pain Foundation: uspainfoundation.org Contact a health care provider if: You have new symptoms. Your symptoms get worse or your pain is severe. You have side effects from your medicines. You have trouble sleeping. Your condition is causing depression or anxiety. Get help right away if: You have thoughts of hurting yourself or others. Get help right away if you feel like you may hurt yourself or others, or have thoughts about taking your own life. Go to your nearest emergency room or: Call 911. Call the National Suicide Prevention Lifeline at 4506407040 or 988. This is open 24 hours a day. Text the Crisis Text Line at (772) 649-3915. This information is not intended to replace advice given to you by your health care provider. Make sure you discuss any questions you have with your health care provider. Document Revised: 10/14/2021 Document Reviewed: 12/07/2020 Elsevier Patient Education  2024 [employer].

## 2022-10-06 LAB — C3 AND C4
C3 Complement: 174 mg/dL (ref 83–193)
C4 Complement: 33 mg/dL (ref 15–57)

## 2022-10-06 LAB — SEDIMENTATION RATE

## 2022-10-06 LAB — HISTONE ANTIBODIES, IGG, BLOOD: Histone Antibodies: <1 U (ref <1.0)

## 2022-10-06 LAB — SED RATE MANUAL WEST RFLX: SED RATE BY MODIFIED WESTERGREN,MANUAL: 4 mm/h (ref 0–30)

## 2022-10-06 LAB — C-REACTIVE PROTEIN: CRP: <3 mg/L (ref <8.0)

## 2022-10-15 ENCOUNTER — Telehealth: Payer: Self-pay | Admitting: *Deleted

## 2022-10-15 NOTE — Telephone Encounter (Signed)
Patient called requesting lab results. Please review and advise. Thanks!

## 2022-10-17 DIAGNOSIS — G4709 Other insomnia: Secondary | ICD-10-CM | POA: Insufficient documentation

## 2022-10-24 ENCOUNTER — Other Ambulatory Visit (INDEPENDENT_AMBULATORY_CARE_PROVIDER_SITE_OTHER): Payer: Managed Care, Other (non HMO)

## 2022-10-24 ENCOUNTER — Ambulatory Visit: Payer: Managed Care, Other (non HMO) | Attending: Internal Medicine | Admitting: Internal Medicine

## 2022-10-24 ENCOUNTER — Ambulatory Visit (INDEPENDENT_AMBULATORY_CARE_PROVIDER_SITE_OTHER): Payer: Managed Care, Other (non HMO) | Admitting: Physician Assistant

## 2022-10-24 ENCOUNTER — Telehealth: Payer: Self-pay | Admitting: Physician Assistant

## 2022-10-24 ENCOUNTER — Encounter: Payer: Self-pay | Admitting: Physician Assistant

## 2022-10-24 ENCOUNTER — Encounter: Payer: Self-pay | Admitting: Internal Medicine

## 2022-10-24 VITALS — BP 163/98 | HR 88 | Resp 15 | Ht 60.0 in | Wt 174.4 lb

## 2022-10-24 DIAGNOSIS — M2550 Pain in unspecified joint: Secondary | ICD-10-CM | POA: Diagnosis not present

## 2022-10-24 DIAGNOSIS — M25562 Pain in left knee: Secondary | ICD-10-CM

## 2022-10-24 DIAGNOSIS — R768 Other specified abnormal immunological findings in serum: Secondary | ICD-10-CM

## 2022-10-24 NOTE — Telephone Encounter (Signed)
Pt checked in early if she can be seen early. Pt states it is notes in her chart form dr appt. Please see chart. Pt phone number is [phone].

## 2022-10-24 NOTE — Telephone Encounter (Signed)
Pt has appt today

## 2022-10-24 NOTE — Progress Notes (Signed)
Office Visit Note  Patient: Debra Adkins             Date of Birth: Jul 10, 1960           MRN: [id number]             PCP: Eden Emms, NP Referring: Eden Emms, NP Visit Date: 10/24/2022   Subjective:  Pain and Edema of the Left Knee (Going to [facility] at 3:00 pm 10/24/2022) and Follow-up (Results)   History of Present Illness: Debra Adkins is a 62 y.o. female here for follow up for chronic joint pain and rashes with positive ANA.  Our initial exam was mostly unremarkable and lab test for systemic inflammation were all normal.  She is back today for follow-up to review labs also due to new onset of joint pain and swelling in her left knee.  This just started after standing up from a chair after a few steps felt a popping sensation in the knee and then the sensation that the joint was going to give way.  Since then has severe pain with pressure and with weightbearing on the medial side of the joint.  Previous HPI 09/30/22 Debra Adkins is a 62 y.o. female here for evaluation of positive ANA associated with joint pains fatigue sensitivity and rashes.  Medical history is also significant for hypothyroidism due to resection for Hashimoto's thyroiditis, gastric surgery for carcinoid tumor, moderate persistent asthma, and chronic venous insufficiency. She notices new and increased symptoms during the past 6 to 12 months.  Did not recall any specific event or medical change.  Main thing in the past few years had been a few episodes of COVID though none with severe complications.  She did sustain a fall landing on her left elbow in 2022 with associated left arm pain.  She had x-rays of the low back and the elbow last year with mild degenerative changes but not particularly significant compared to symptoms.  Also had ultrasound exam ruling out upper extremity DVT due to persistent swelling.  Subsequent elbow MRI consistent with tendinosis or partial thickness tear with associated edema.  Besides  this her main joint pain and she has been in the back and at the hips.  This is limiting her walking ability previously was very active walking 5 dogs now cannot tolerate a lot due to pain in her hip also with pain and numbness in her toes.  She feels the left hip gets sharp pain anteriorly that comes and goes.  Also gets lateral hip pain bothers him trying to sleep.  Pain throughout her back from upper to lower.  She is also been experiencing a sensitivity to light touch and frequent burning painful sensation in her skin mostly in her torso and proximal upper body.  She was prescribed Flexeril for muscle spasms in June not currently on any other prescription medication for musculoskeletal or nerve pain. Evaluation in primary care office in April showed a high TSH and low vitamin D these were corrected by follow-up in July.  Also takes B12 supplementation. Has noticed skin rash with redness at the front of the neck and sometimes across the bridge of the nose and central face.  Most often sees this after waking up individual episodes lasting minutes or up to a few hours.  Does not particularly associated with sun exposure. She notices hair and fingernail thinning and brittleness.  Has dry eyes and mouth 1 recent sore on the inside of right cheek. Fingers and toes  often cold or numb without visible discoloration. Sleep quality is very poor describes several hours trying to fall asleep each night despite feeling excessively tired.  Has never had a sleep study has been told that she snores this was apparently worse when she weighed more. She does report short term memory problems and poor concentration. She has diarrhea chronically. Some episodes of dizziness not having headaches.   Labs reviewed ANA 1:80 homogenous RF neg CCP neg MCV neg   Review of Systems  Constitutional:  Positive for fatigue.  HENT:  Positive for mouth sores and mouth dryness.   Eyes:  Positive for dryness.  Respiratory:  Positive  for shortness of breath.   Cardiovascular:  Positive for chest pain and palpitations.  Gastrointestinal:  Positive for diarrhea. Negative for blood in stool and constipation.  Endocrine: Negative for increased urination.  Genitourinary:  Negative for involuntary urination.  Musculoskeletal:  Positive for joint pain, gait problem, joint pain, joint swelling, myalgias, muscle weakness, morning stiffness, muscle tenderness and myalgias.  Skin:  Positive for rash and hair loss. Negative for color change and sensitivity to sunlight.  Allergic/Immunologic: Positive for susceptible to infections.  Neurological:  Positive for dizziness and headaches.  Hematological:  Positive for swollen glands.  Psychiatric/Behavioral:  Positive for depressed mood and sleep disturbance. The patient is nervous/anxious.     PMFS History:  Patient Active Problem List   Diagnosis Date Noted   Pain in left knee 10/24/2022   Other insomnia 10/17/2022   Positive ANA (antinuclear antibody) 09/30/2022   Fall 08/05/2022   Skin lesion 08/05/2022   Obesity (BMI 30-39.9) 08/05/2022   Vitamin D deficiency 08/05/2022   Depression, major, single episode, mild (HCC) 05/19/2022   Multiple joint pain 05/19/2022   Lateral epicondylitis, left elbow 08/22/2021   Chronic venous insufficiency 08/02/2021   Left arm pain 06/05/2021   Lumbar pain 05/15/2021   Left elbow pain 05/15/2021   Preventative health care 05/15/2021   Varicose veins of both lower extremities with pain 05/15/2021   Hiatal hernia 05/02/2020   History of malignant carcinoid tumor of stomach 05/02/2020   B12 deficiency 10/20/2019   History of gastric surgery 10/20/2019   Hypothyroidism due to Hashimoto's thyroiditis 10/20/2019   Moderate persistent asthma without complication 10/20/2019   COVID-19 03/17/2019    Past Medical History:  Diagnosis Date   Allergy    Asthma    Blood transfusion without reported diagnosis    Carcinoid tumor of colon     Followed by Dr. Remi Haggard in [place]   Chicken pox    Constipation    Diverticulosis    Hypertension    Thyroid disease    hoshimotos's thyroiditis at age 32    Family History  Problem Relation Age of Onset   Heart disease Father    Hyperlipidemia Father    Other Father        Heart transplant   Diabetes Brother    Heart disease Brother    Testicular cancer Brother    Heart disease Brother    Heart attack Brother    Past Surgical History:  Procedure Laterality Date   ABDOMINAL HYSTERECTOMY     states took everything but cervix?   DG GALL BLADDER     LAPAROSCOPIC GASTRIC SLEEVE RESECTION     TENNIS ELBOW RELEASE/NIRSCHEL PROCEDURE Left 01/24/2022   Procedure: LEFT LATERAL EPICONDYLE DEBRIDEMENT, REPAIR AS INIDICATED;  Surgeon: Tarry Kos, MD;  Location: [facility];  Service: Orthopedics;  Laterality:  Left;   THYROIDECTOMY     Social History   Social History Narrative   05/02/20   From: [place], but moved when she was high school   Living: with Andrey Farmer, husband (2019) but together since 2005   Work: not currently due to health issues      Family: brother in [place]      Enjoys: rescue dogs - has 5      Exercise: not currently due to joint issues   Diet: low calorie, salads      Safety   Seat belts: Yes    Guns: Yes  and secure   Safe in relationships: Yes    Immunization History  Administered Date(s) Administered   Tdap 08/05/2022     Objective: Vital Signs: BP (!) 163/98 (BP Location: Right Arm, Patient Position: Sitting, Cuff Size: Normal)   Pulse 88   Resp 15   Ht 5' (1.524 m)   Wt 174 lb 6.4 oz (79.1 kg)   BMI 34.06 kg/m    Physical Exam Cardiovascular:     Rate and Rhythm: Normal rate and regular rhythm.  Pulmonary:     Effort: Pulmonary effort is normal.     Breath sounds: Normal breath sounds.  Musculoskeletal:     Right lower leg: No edema.     Left lower leg: No edema.  Skin:    General: Skin is warm and dry.   Neurological:     Mental Status: She is alert.  Psychiatric:        Mood and Affect: Mood normal.      Musculoskeletal Exam:  Left knee very tender to pressure at medial joint line, mild swelling present, ROM limited with guarding  Limited musculoskeletal ultrasound of the knee demonstrates narrowing of space between the tibia and femur there is also displacement of the medial meniscus and a large parameniscal cyst present  Investigation: No additional findings.  Imaging: No results found.  Recent Labs: Lab Results  Component Value Date   WBC 6.7 05/19/2022   HGB 15.1 (H) 05/19/2022   PLT 231.0 05/19/2022   NA 140 05/19/2022   K 4.2 05/19/2022   CL 104 05/19/2022   CO2 28 05/19/2022   GLUCOSE 92 05/19/2022   BUN 10 05/19/2022   CREATININE 0.81 05/19/2022   BILITOT 0.6 05/19/2022   ALKPHOS 65 05/19/2022   AST 18 05/19/2022   ALT 22 05/19/2022   PROT 6.9 05/19/2022   ALBUMIN 4.3 05/19/2022   CALCIUM 9.3 05/19/2022    Speciality Comments: No specialty comments available.  Procedures:  No procedures performed Allergies: Chocolate and Codeine   Assessment / Plan:     Visit Diagnoses: Acute pain of left knee  History and exam is concerning for medial meniscus tear especially with large parameniscal cyst on ultrasound exam.  Otherwise might have a chronic tear and just has a new exacerbation of knee osteoarthritis.  She already has an appointment scheduled with orthopedic surgery clinic this afternoon which is the best setting for this.  Holding off on any new medication start today until that evaluation and I expect they will probably be getting imaging.  Multiple joint pain Positive ANA (antinuclear antibody)  The positive ANA with increased joint pain and multiple episodes of confirmed tendon inflammation during the past year might be a seronegative inflammatory arthritis but I do not have any definite evidence for this.  Other possibility would be some degree of  chronic pain sensitization with existing wear and tear at multiple  joints and fatigue might be worsened with poor sleep.  Initial plan to recommend trial of low-dose steroid medication with subsequent reassessment if highly beneficial could try addition of DMARD.  Orders: No orders of the defined types were placed in this encounter.  No orders of the defined types were placed in this encounter.    Follow-Up Instructions: No follow-ups on file.   Fuller Plan, MD  Note - This record has been created using [employer].  Chart creation errors have been sought, but may not always  have been located. Such creation errors do not reflect on  the standard of medical care.

## 2022-10-24 NOTE — Progress Notes (Signed)
Office Visit Note   Patient: Debra Adkins           Date of Birth: [birth year]           MRN: [id number] Visit Date: 10/24/2022              Requested by: Eden Emms, NP [address] [place],  [place] [address] PCP: Eden Emms, NP  No chief complaint on file.     HPI: The patient is a 62 year old woman who comes in today with acute left medial knee pain.  She states that she got up out of a chair and took a few steps and had a very painful popping sensation on the medial side of her knee which caused her knee to give way.  She now has difficulty flexing and extending her knee.  She has difficulty bearing weight.  She was seen by her rheumatologist who did use an ultrasound and told her that he thought she had a meniscus tear.  Pain is moderate  Assessment & Plan: Visit Diagnoses: Acute meniscus tear  Plan: Her x-rays do not show anything osseous.  She does not have significant arthritis.  She has acute mechanical symptoms of locking catching and has difficulty straightening and bending her knee.  She is exquisitely tender over the medial joint line.  Because of this and the ultrasound findings that may indicate a significant meniscus tear I have ordered a stat MRI of her left knee.  Based on this I would refer her to one of our surgeons in the meantime I think she should limit her weightbearing or wear a brace  Follow-Up Instructions: Will review MRI referral to surgeon  Ortho Exam  Patient is alert, oriented, no adenopathy, well-dressed, normal affect, normal respiratory effort. Examination of her left knee she is neurovascularly intact compartments are soft and nontender she has no effusion.  She is exquisitely tender to palpation over the posterior medial joint line.  She cannot tolerate full extension or flexion past 90 degrees.  Good endpoint on anterior draw.  No lateral sided symptoms no pain over the patellofemoral joint is appreciated  Imaging: No results  found. No images are attached to the encounter.  Labs: Lab Results  Component Value Date   HGBA1C 5.4 05/19/2022   HGBA1C 5.6 05/15/2021   ESRSEDRATE CANCELED 09/30/2022   CRP <3.0 09/30/2022     Lab Results  Component Value Date   ALBUMIN 4.3 05/19/2022   ALBUMIN 4.7 05/15/2021   ALBUMIN 4.7 10/25/2020    No results found for: "MG" Lab Results  Component Value Date   VD25OH 32.47 08/05/2022   VD25OH 17.44 (L) 05/19/2022   VD25OH 26.22 (L) 10/07/2019    No results found for: "PREALBUMIN"    Latest Ref Rng & Units 05/19/2022   10:26 AM 05/15/2021   12:19 PM 10/25/2020   12:22 PM  CBC EXTENDED  WBC 4.0 - 10.5 K/uL 6.7  7.5  7.2   RBC 3.87 - 5.11 Mil/uL 4.83  4.81  4.90   Hemoglobin 12.0 - 15.0 g/dL 19.1  47.8  29.5   HCT 36.0 - 46.0 % 45.4  44.6  45.5   Platelets 150.0 - 400.0 K/uL 231.0  231.0  226.0   NEUT# 1.4 - 7.7 K/uL   4.6   Lymph# 0.7 - 4.0 K/uL   1.8      There is no height or weight on file to calculate BMI.  Orders:  No orders of  the defined types were placed in this encounter.  No orders of the defined types were placed in this encounter.    Procedures: No procedures performed  Clinical Data: No additional findings.  ROS:  All other systems negative, except as noted in the HPI. Review of Systems  Objective: Vital Signs: There were no vitals taken for this visit.  Specialty Comments:  No specialty comments available.  PMFS History: Patient Active Problem List   Diagnosis Date Noted   Pain in left knee 10/24/2022   Other insomnia 10/17/2022   Positive ANA (antinuclear antibody) 09/30/2022   Fall 08/05/2022   Skin lesion 08/05/2022   Obesity (BMI 30-39.9) 08/05/2022   Vitamin D deficiency 08/05/2022   Depression, major, single episode, mild (HCC) 05/19/2022   Multiple joint pain 05/19/2022   Lateral epicondylitis, left elbow 08/22/2021   Chronic venous insufficiency 08/02/2021   Left arm pain 06/05/2021   Lumbar pain 05/15/2021    Left elbow pain 05/15/2021   Preventative health care 05/15/2021   Varicose veins of both lower extremities with pain 05/15/2021   Hiatal hernia 05/02/2020   History of malignant carcinoid tumor of stomach 05/02/2020   B12 deficiency 10/20/2019   History of gastric surgery 10/20/2019   Hypothyroidism due to Hashimoto's thyroiditis 10/20/2019   Moderate persistent asthma without complication 10/20/2019   COVID-19 03/17/2019   Past Medical History:  Diagnosis Date   Allergy    Asthma    Blood transfusion without reported diagnosis    Carcinoid tumor of colon    Followed by Dr. Remi Haggard in [place]   Chicken pox    Constipation    Diverticulosis    Hypertension    Thyroid disease    hoshimotos's thyroiditis at age 47    Family History  Problem Relation Age of Onset   Heart disease Father    Hyperlipidemia Father    Other Father        Heart transplant   Diabetes Brother    Heart disease Brother    Testicular cancer Brother    Heart disease Brother    Heart attack Brother     Past Surgical History:  Procedure Laterality Date   ABDOMINAL HYSTERECTOMY     states took everything but cervix?   DG GALL BLADDER     LAPAROSCOPIC GASTRIC SLEEVE RESECTION     TENNIS ELBOW RELEASE/NIRSCHEL PROCEDURE Left 01/24/2022   Procedure: LEFT LATERAL EPICONDYLE DEBRIDEMENT, REPAIR AS INIDICATED;  Surgeon: Tarry Kos, MD;  Location: [facility];  Service: Orthopedics;  Laterality: Left;   THYROIDECTOMY     Social History   Occupational History   Not on file  Tobacco Use   Smoking status: Former    Current packs/day: 0.00    Average packs/day: 0.3 packs/day for 20.0 years (5.0 ttl pk-yrs)    Types: Cigarettes    Start date: 01/20/1985    Quit date: 01/20/2005    Years since quitting: 17.7    Passive exposure: Past   Smokeless tobacco: Never  Vaping Use   Vaping status: Never Used  Substance and Sexual Activity   Alcohol use: Yes    Alcohol/week: 6.0 standard  drinks of alcohol    Types: 6 Glasses of wine per week   Drug use: Never   Sexual activity: Not Currently

## 2022-10-26 ENCOUNTER — Other Ambulatory Visit: Payer: Managed Care, Other (non HMO)

## 2022-10-27 ENCOUNTER — Encounter: Payer: Self-pay | Admitting: Physician Assistant

## 2022-10-29 ENCOUNTER — Ambulatory Visit
Admission: RE | Admit: 2022-10-29 | Discharge: 2022-10-29 | Disposition: A | Discharge status: Home / Self Care | Payer: Managed Care, Other (non HMO) | Source: Ambulatory Visit | Attending: Physician Assistant | Admitting: Physician Assistant

## 2022-10-29 ENCOUNTER — Telehealth: Payer: Self-pay | Admitting: Physician Assistant

## 2022-10-29 ENCOUNTER — Ambulatory Visit: Payer: Managed Care, Other (non HMO) | Admitting: Orthopedic Surgery

## 2022-10-29 DIAGNOSIS — M25562 Pain in left knee: Secondary | ICD-10-CM

## 2022-10-29 DIAGNOSIS — S83207D Unspecified tear of unspecified meniscus, current injury, left knee, subsequent encounter: Secondary | ICD-10-CM | POA: Diagnosis not present

## 2022-10-29 MED ORDER — TRAMADOL HCL 50 MG PO TABS
50.0000 mg | ORAL_TABLET | Freq: Three times a day (TID) | ORAL | 0 refills | Status: DC | PRN
Start: 2022-10-29 — End: 2022-11-06

## 2022-10-29 NOTE — Telephone Encounter (Signed)
Pt called to request pain meds.

## 2022-10-30 NOTE — Progress Notes (Signed)
Office Visit Note   Patient: Debra Adkins           Date of Birth: 1960/05/01           MRN: [id number] Visit Date: 10/29/2022 Requested by: Eden Emms, NP [address] [place],  [place] [address] PCP: Eden Emms, NP  Subjective: Chief Complaint  Patient presents with   Left Knee - Pain    HPI: Debra Adkins is a 62 y.o. female who presents to the office reporting left knee pain.  She stood up from a sitting in a chair in early October and felt a pop in her knee gave way.  She does have a history of other medical conditions like Hashimoto's thyroiditis.  Retired from working at [employer].  Cannot take anti-inflammatories per her gastrointestinal doctor.  MRI scan does show medial meniscal tear with meniscal cyst and possible grade 1 sprain of the MCL.  Mild degenerative changes in the medial and lateral compartments..                ROS: All systems reviewed are negative as they relate to the chief complaint within the history of present illness.  Patient denies fevers or chills.  Assessment & Plan: Visit Diagnoses:  1. Acute meniscal tear of left knee, subsequent encounter     Plan: Impression is symptomatic left medial meniscal tear in a patient who has fair amount of disability as well as mechanical symptoms from this injury.  We talked about operative and nonoperative treatment options.  Patient would like to proceed with surgical debridement.  The risk and benefits of that intervention are discussed including not limited to infection or vessel damage incomplete pain relief as well as incomplete restoration of function.  Expected rehabilitation course including a period of weightbearing as tolerated with crutches for 4 to 5 days after surgery followed by a period of rehabilitation on the bike for the next 4 weeks is suggested.  Patient understands risk and benefits of surgery.  All questions answered.  No personal or family history of DVT or pulmonary embolism in her family.   Follow-up 7 days after surgery  Follow-Up Instructions: No follow-ups on file.   Orders:  No orders of the defined types were placed in this encounter.  Meds ordered this encounter  Medications   traMADol (ULTRAM) 50 MG tablet    Sig: Take 1 tablet (50 mg total) by mouth every 8 (eight) hours as needed.    Dispense:  25 tablet    Refill:  0      Procedures: No procedures performed   Clinical Data: No additional findings.  Objective: Vital Signs: There were no vitals taken for this visit.  Physical Exam:  Constitutional: Patient appears well-developed HEENT:  Head: Normocephalic Eyes:EOM are normal Neck: Normal range of motion Cardiovascular: Normal rate Pulmonary/chest: Effort normal Neurologic: Patient is alert Skin: Skin is warm Psychiatric: Patient has normal mood and affect  Ortho Exam: Ortho exam demonstrates antalgic gait to the left.  Trace effusion is present in the left knee.  Medial joint line tenderness is present with positive McMurray compression testing.  Collateral crucial ligaments are stable.  Extensor mechanism intact.  No calf tenderness negative Homans.  No Baker's cyst palpable in the posterior aspect of the knee.  No groin pain on the left-hand side with internal/external Tatian of the leg.  Specialty Comments:  No specialty comments available.  Imaging: No results found.   PMFS History: Patient Active Problem  List   Diagnosis Date Noted   Pain in left knee 10/24/2022   Other insomnia 10/17/2022   Positive ANA (antinuclear antibody) 09/30/2022   Fall 08/05/2022   Skin lesion 08/05/2022   Obesity (BMI 30-39.9) 08/05/2022   Vitamin D deficiency 08/05/2022   Depression, major, single episode, mild (HCC) 05/19/2022   Multiple joint pain 05/19/2022   Lateral epicondylitis, left elbow 08/22/2021   Chronic venous insufficiency 08/02/2021   Left arm pain 06/05/2021   Lumbar pain 05/15/2021   Left elbow pain 05/15/2021   Preventative health  care 05/15/2021   Varicose veins of both lower extremities with pain 05/15/2021   Hiatal hernia 05/02/2020   History of malignant carcinoid tumor of stomach 05/02/2020   B12 deficiency 10/20/2019   History of gastric surgery 10/20/2019   Hypothyroidism due to Hashimoto's thyroiditis 10/20/2019   Moderate persistent asthma without complication 10/20/2019   COVID-19 03/17/2019   Past Medical History:  Diagnosis Date   Allergy    Asthma    Blood transfusion without reported diagnosis    Carcinoid tumor of colon    Followed by Dr. Remi Haggard in [place]   Chicken pox    Constipation    Diverticulosis    Hypertension    Thyroid disease    hoshimotos's thyroiditis at age 22    Family History  Problem Relation Age of Onset   Heart disease Father    Hyperlipidemia Father    Other Father        Heart transplant   Diabetes Brother    Heart disease Brother    Testicular cancer Brother    Heart disease Brother    Heart attack Brother     Past Surgical History:  Procedure Laterality Date   ABDOMINAL HYSTERECTOMY     states took everything but cervix?   DG GALL BLADDER     LAPAROSCOPIC GASTRIC SLEEVE RESECTION     TENNIS ELBOW RELEASE/NIRSCHEL PROCEDURE Left 01/24/2022   Procedure: LEFT LATERAL EPICONDYLE DEBRIDEMENT, REPAIR AS INIDICATED;  Surgeon: Tarry Kos, MD;  Location: [facility];  Service: Orthopedics;  Laterality: Left;   THYROIDECTOMY     Social History   Occupational History   Not on file  Tobacco Use   Smoking status: Former    Current packs/day: 0.00    Average packs/day: 0.3 packs/day for 20.0 years (5.0 ttl pk-yrs)    Types: Cigarettes    Start date: 01/20/1985    Quit date: 01/20/2005    Years since quitting: 17.7    Passive exposure: Past   Smokeless tobacco: Never  Vaping Use   Vaping status: Never Used  Substance and Sexual Activity   Alcohol use: Yes    Alcohol/week: 6.0 standard drinks of alcohol    Types: 6 Glasses of wine per  week   Drug use: Never   Sexual activity: Not Currently

## 2022-10-31 ENCOUNTER — Telehealth: Payer: Self-pay | Admitting: Orthopedic Surgery

## 2022-10-31 NOTE — Telephone Encounter (Signed)
Patient is scheduled for left knee arthroscopy with debridement at the [facility] [place] on 11-06-22.  Sherrye Payor, RN with pre-op assessment at [facility] sent an email stating she would need medical clearance specifically from her gastroenterologist regarding her history of malignant carcinoid tumor of the stomach. Patient states she has already had surgery to remove the small tumor.   The anesthesiologist states she will need this clearance prior to surgery on Thursday.  A clearance request has been sent to Dr Loyal Jacobson with confirmation of receipt.  Patient is calling gastro doctor now to make sure they follow up as quickly as possible.

## 2022-11-01 ENCOUNTER — Encounter: Payer: Self-pay | Admitting: Orthopedic Surgery

## 2022-11-01 NOTE — Telephone Encounter (Signed)
Thx very much

## 2022-11-04 IMAGING — MG MM DIGITAL SCREENING BILAT W/ TOMO AND CAD
8 series · 8 of 24 positions shown · non-contrast
Comparison: Previous exam(s).

CLINICAL DATA: Screening.

EXAM:
DIGITAL SCREENING BILATERAL MAMMOGRAM WITH TOMOSYNTHESIS AND CAD
TECHNIQUE: Bilateral screening digital craniocaudal and mediolateral oblique
mammograms were obtained. Bilateral screening digital breast
tomosynthesis was performed. The images were evaluated with
computer-aided detection.

[L MLO synth-2D]
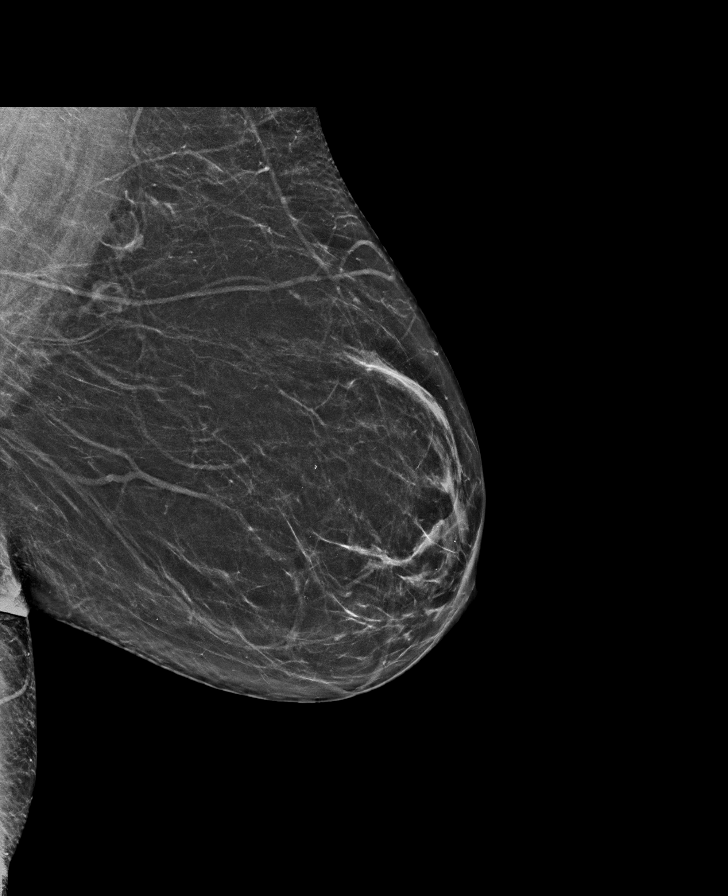

[R MLO synth-2D]
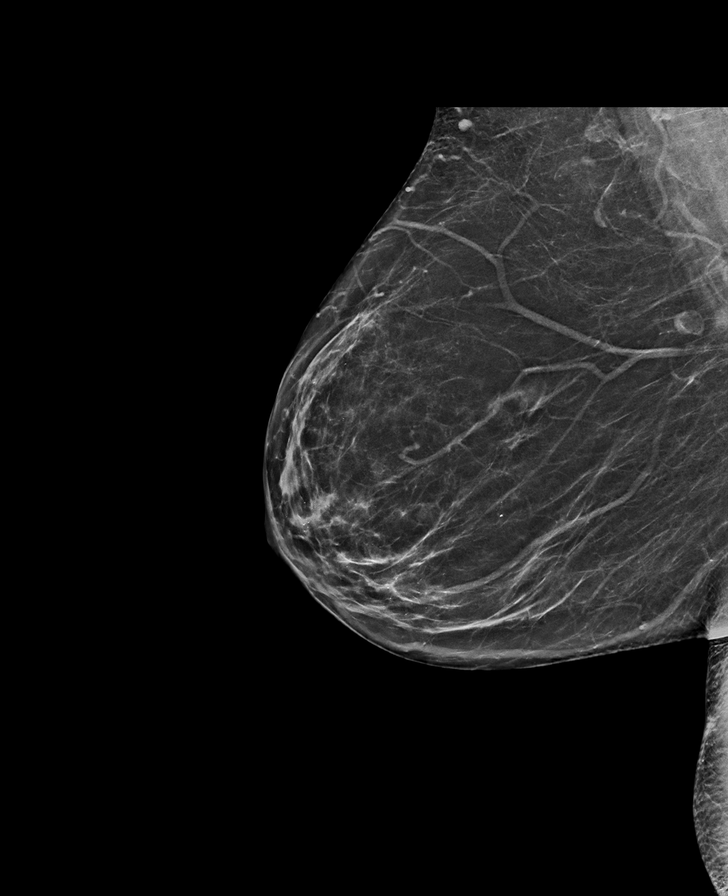

[L CC synth-2D]
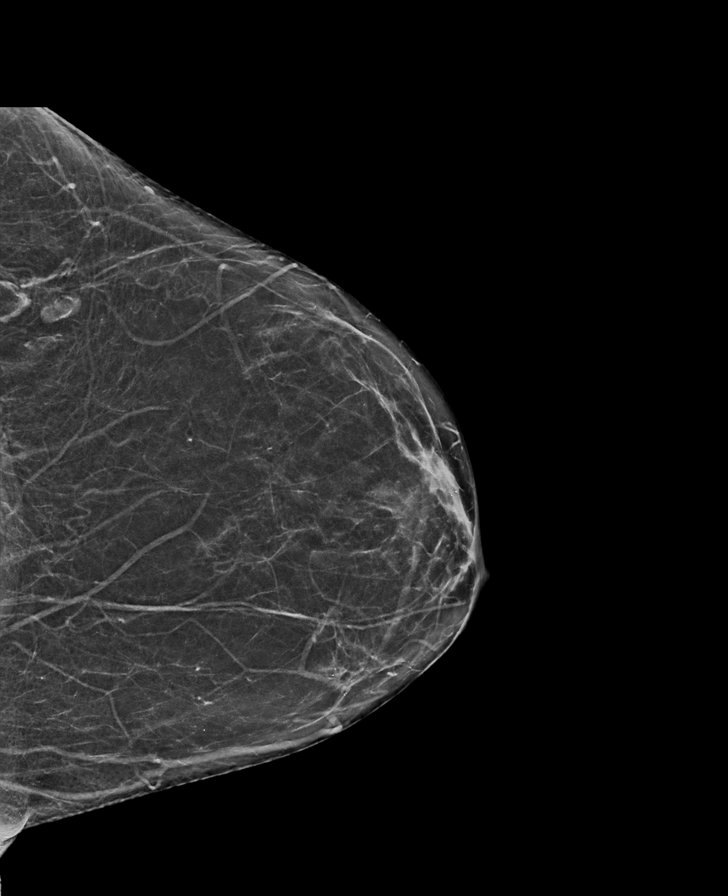

[R CC synth-2D]
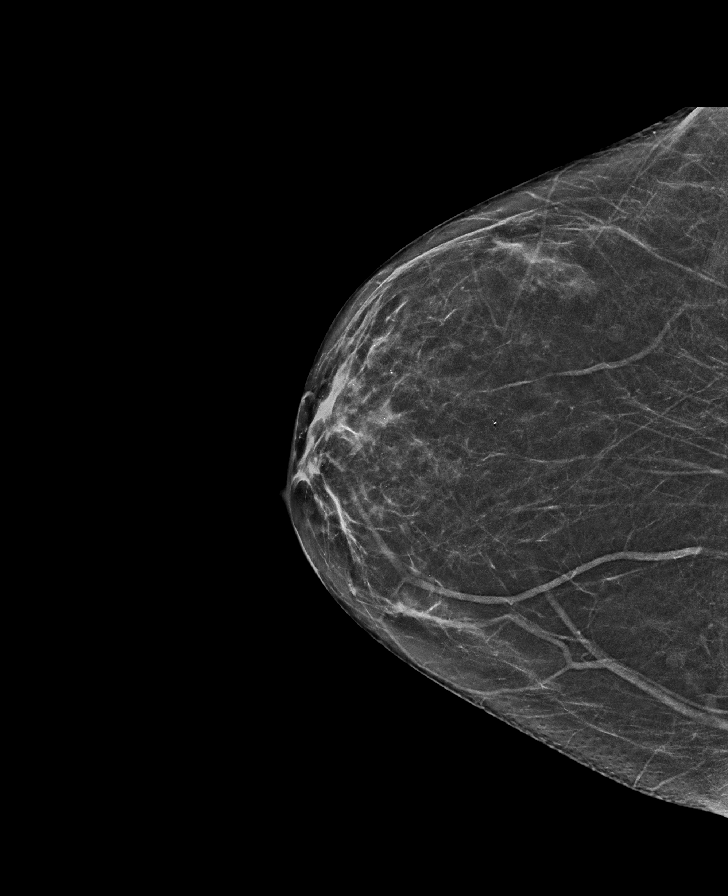

[R MLO tomo · tomo slice 33/66.0]
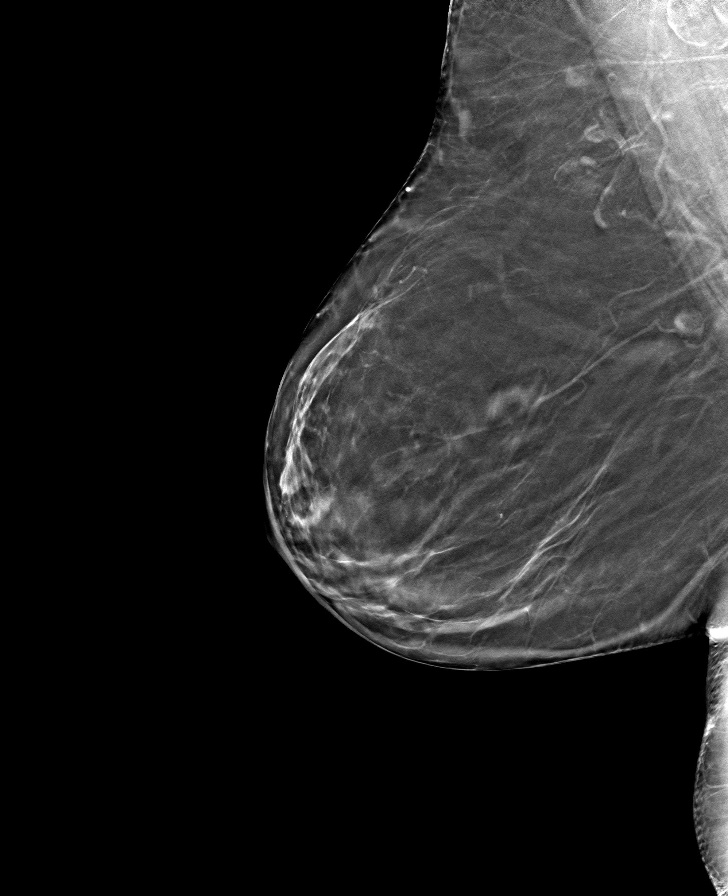

[L MLO tomo · tomo slice 33/66.0]
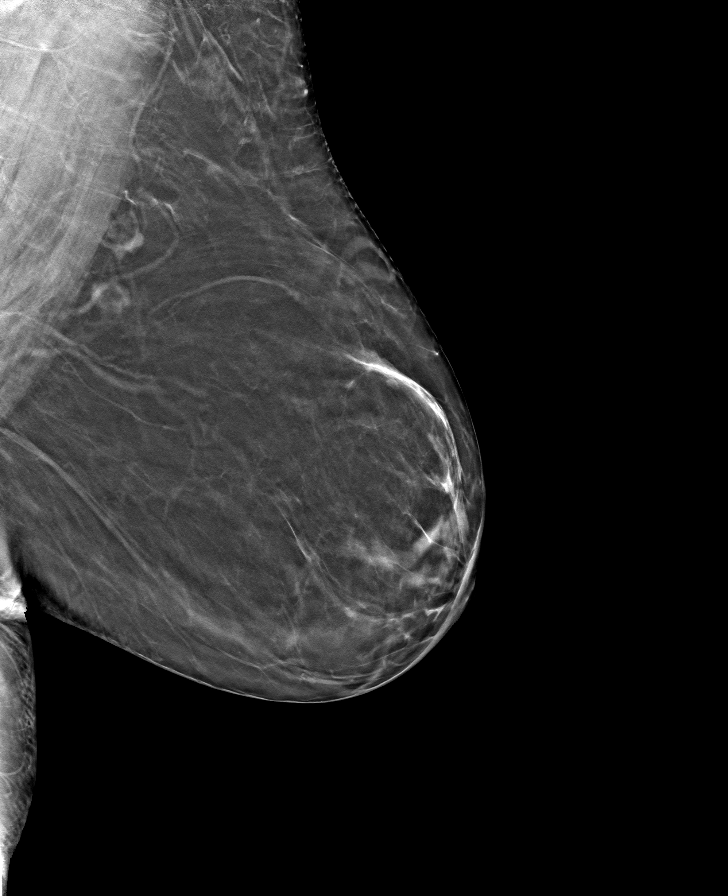

[R CC tomo · tomo slice 30/59.0]
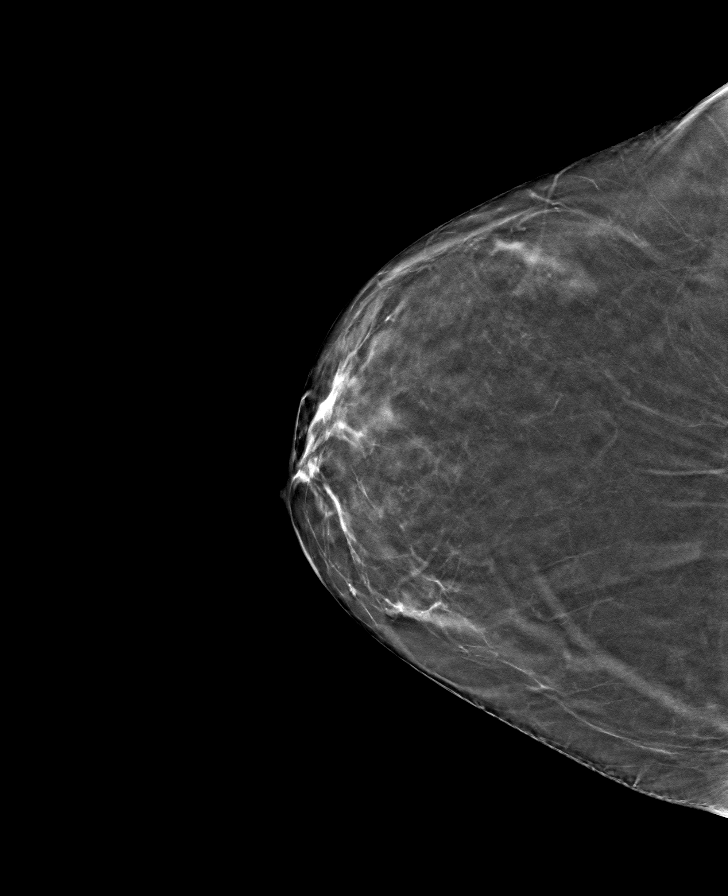

[L CC tomo · tomo slice 31/61.0]
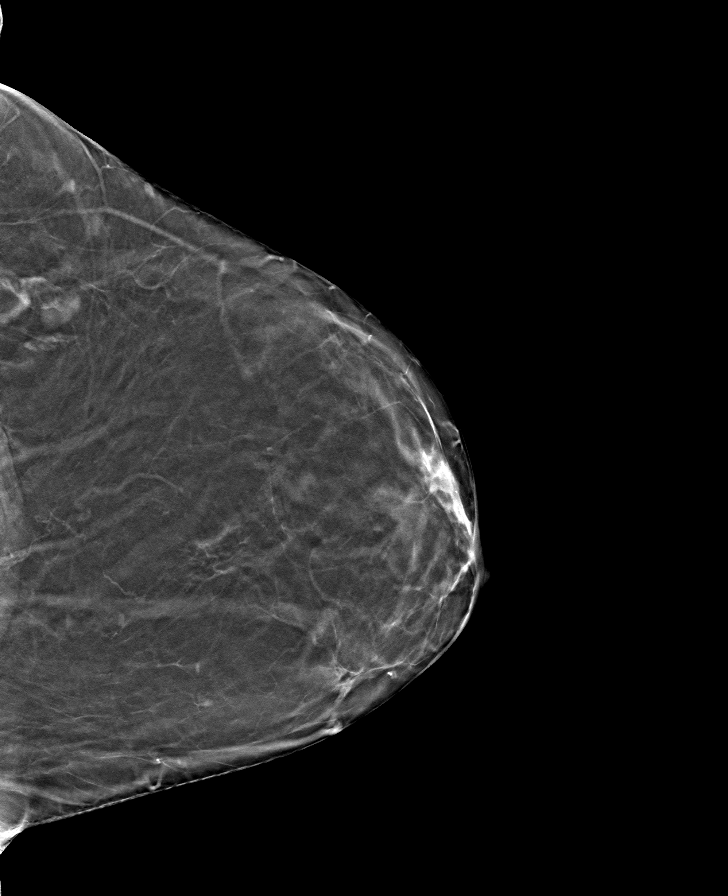

[8 of 24 positions shown; findings below may reference images not displayed]

ACR Breast Density Category b: There are scattered areas of
fibroglandular density.
FINDINGS: There are no findings suspicious for malignancy.
IMPRESSION: No mammographic evidence of malignancy. A result letter of this
screening mammogram will be mailed directly to the patient.

RECOMMENDATION:
Screening mammogram in one year. (Code:51-O-LD2)

BI-RADS CATEGORY  1: Negative.

## 2022-11-06 ENCOUNTER — Other Ambulatory Visit: Payer: Self-pay | Admitting: Surgical

## 2022-11-06 ENCOUNTER — Encounter: Payer: Self-pay | Admitting: Orthopedic Surgery

## 2022-11-06 DIAGNOSIS — S83232A Complex tear of medial meniscus, current injury, left knee, initial encounter: Secondary | ICD-10-CM | POA: Diagnosis not present

## 2022-11-06 MED ORDER — OXYCODONE HCL 5 MG PO TABS
5.0000 mg | ORAL_TABLET | Freq: Four times a day (QID) | ORAL | 0 refills | Status: DC | PRN
Start: 2022-11-06 — End: 2022-11-14

## 2022-11-06 MED ORDER — ASPIRIN 81 MG PO CHEW: 81.0000 mg | CHEWABLE_TABLET | Freq: Every day | ORAL | 0 refills | Status: AC

## 2022-11-14 ENCOUNTER — Encounter: Payer: Self-pay | Admitting: Surgical

## 2022-11-14 ENCOUNTER — Ambulatory Visit (INDEPENDENT_AMBULATORY_CARE_PROVIDER_SITE_OTHER): Payer: Managed Care, Other (non HMO) | Admitting: Surgical

## 2022-11-14 DIAGNOSIS — S83207D Unspecified tear of unspecified meniscus, current injury, left knee, subsequent encounter: Secondary | ICD-10-CM

## 2022-11-14 MED ORDER — OXYCODONE HCL 5 MG PO TABS: 5.0000 mg | ORAL_TABLET | Freq: Two times a day (BID) | ORAL | 0 refills | Status: DC | PRN

## 2022-11-14 NOTE — Progress Notes (Signed)
Post-Op Visit Note   Patient: Debra Adkins           Date of Birth: 02-Apr-1960           MRN: [id number] Visit Date: 11/14/2022 PCP: Eden Emms, NP   Assessment & Plan:  Chief Complaint:  Chief Complaint  Patient presents with   Left Knee - Routine Post Op    11/06/22 left knee scope   Visit Diagnoses:  1. Acute meniscal tear of left knee, subsequent encounter     Plan: Patient is a 62 year old female who presents s/p left knee arthroscopy with meniscal debridement on 11/06/2022.  Doing well overall.  She is able to weight-bear.  No fevers or chills.  No chest pain/shortness of breath/calf pain.  She has been doing some knee range of motion exercises at home.  On exam, patient has 3 degrees extension and 95 degrees of knee flexion.  Small effusion is present.  Incisions look to be healing well with sutures intact and no evidence of cellulitis or infection/dehiscence.  Sutures removed and replaced with Steri-Strips today.  Excellent quad strength and able to perform straight leg raise without extensor lag.  No calf tenderness.  Negative Homans' sign.  Plan is to start physical therapy upstairs to 1 time to design a home exercise program specifically for knee range of motion exercises and quad strengthening following knee arthroscopy.  Refill oxycodone.  Follow-up in 4 weeks for clinical recheck with Dr. August Saucer.  Follow-Up Instructions: No follow-ups on file.   Orders:  Orders Placed This Encounter  Procedures   Ambulatory referral to Physical Therapy   No orders of the defined types were placed in this encounter.   Imaging: No results found.  PMFS History: Patient Active Problem List   Diagnosis Date Noted   Pain in left knee 10/24/2022   Other insomnia 10/17/2022   Positive ANA (antinuclear antibody) 09/30/2022   Fall 08/05/2022   Skin lesion 08/05/2022   Obesity (BMI 30-39.9) 08/05/2022   Vitamin D deficiency 08/05/2022   Depression, major, single episode, mild  (HCC) 05/19/2022   Multiple joint pain 05/19/2022   Lateral epicondylitis, left elbow 08/22/2021   Chronic venous insufficiency 08/02/2021   Left arm pain 06/05/2021   Lumbar pain 05/15/2021   Left elbow pain 05/15/2021   Preventative health care 05/15/2021   Varicose veins of both lower extremities with pain 05/15/2021   Hiatal hernia 05/02/2020   History of malignant carcinoid tumor of stomach 05/02/2020   B12 deficiency 10/20/2019   History of gastric surgery 10/20/2019   Hypothyroidism due to Hashimoto's thyroiditis 10/20/2019   Moderate persistent asthma without complication 10/20/2019   COVID-19 03/17/2019   Past Medical History:  Diagnosis Date   Allergy    Asthma    Blood transfusion without reported diagnosis    Carcinoid tumor of colon    Followed by Dr. Remi Haggard in [place]   Chicken pox    Constipation    Diverticulosis    Hypertension    Thyroid disease    hoshimotos's thyroiditis at age 25    Family History  Problem Relation Age of Onset   Heart disease Father    Hyperlipidemia Father    Other Father        Heart transplant   Diabetes Brother    Heart disease Brother    Testicular cancer Brother    Heart disease Brother    Heart attack Brother     Past Surgical History:  Procedure Laterality  Date   ABDOMINAL HYSTERECTOMY     states took everything but cervix?   DG GALL BLADDER     LAPAROSCOPIC GASTRIC SLEEVE RESECTION     TENNIS ELBOW RELEASE/NIRSCHEL PROCEDURE Left 01/24/2022   Procedure: LEFT LATERAL EPICONDYLE DEBRIDEMENT, REPAIR AS INIDICATED;  Surgeon: Tarry Kos, MD;  Location: [facility];  Service: Orthopedics;  Laterality: Left;   THYROIDECTOMY     Social History   Occupational History   Not on file  Tobacco Use   Smoking status: Former    Current packs/day: 0.00    Average packs/day: 0.3 packs/day for 20.0 years (5.0 ttl pk-yrs)    Types: Cigarettes    Start date: 01/20/1985    Quit date: 01/20/2005    Years  since quitting: 17.8    Passive exposure: Past   Smokeless tobacco: Never  Vaping Use   Vaping status: Never Used  Substance and Sexual Activity   Alcohol use: Yes    Alcohol/week: 6.0 standard drinks of alcohol    Types: 6 Glasses of wine per week   Drug use: Never   Sexual activity: Not Currently

## 2022-11-18 ENCOUNTER — Ambulatory Visit: Payer: Managed Care, Other (non HMO) | Admitting: Physical Therapy

## 2022-11-18 NOTE — Therapy (Deleted)
OUTPATIENT PHYSICAL THERAPY EVALUATION   Patient Name: Debra Adkins MRN: [id number] DOB:[birth year], 62 y.o., female Today's Date: 11/18/2022  END OF SESSION:   Past Medical History:  Diagnosis Date   Allergy    Asthma    Blood transfusion without reported diagnosis    Carcinoid tumor of colon    Followed by Dr. Remi Haggard in [place]   Chicken pox    Constipation    Diverticulosis    Hypertension    Thyroid disease    hoshimotos's thyroiditis at age 64   Past Surgical History:  Procedure Laterality Date   ABDOMINAL HYSTERECTOMY     states took everything but cervix?   DG GALL BLADDER     LAPAROSCOPIC GASTRIC SLEEVE RESECTION     TENNIS ELBOW RELEASE/NIRSCHEL PROCEDURE Left 01/24/2022   Procedure: LEFT LATERAL EPICONDYLE DEBRIDEMENT, REPAIR AS INIDICATED;  Surgeon: Tarry Kos, MD;  Location: [facility];  Service: Orthopedics;  Laterality: Left;   THYROIDECTOMY     Patient Active Problem List   Diagnosis Date Noted   Pain in left knee 10/24/2022   Other insomnia 10/17/2022   Positive ANA (antinuclear antibody) 09/30/2022   Fall 08/05/2022   Skin lesion 08/05/2022   Obesity (BMI 30-39.9) 08/05/2022   Vitamin D deficiency 08/05/2022   Depression, major, single episode, mild (HCC) 05/19/2022   Multiple joint pain 05/19/2022   Lateral epicondylitis, left elbow 08/22/2021   Chronic venous insufficiency 08/02/2021   Left arm pain 06/05/2021   Lumbar pain 05/15/2021   Left elbow pain 05/15/2021   Preventative health care 05/15/2021   Varicose veins of both lower extremities with pain 05/15/2021   Hiatal hernia 05/02/2020   History of malignant carcinoid tumor of stomach 05/02/2020   B12 deficiency 10/20/2019   History of gastric surgery 10/20/2019   Hypothyroidism due to Hashimoto's thyroiditis 10/20/2019   Moderate persistent asthma without complication 10/20/2019   COVID-19 03/17/2019    PCP: Eden Emms, NP  REFERRING PROVIDER: Julieanne Cotton, PA-C  REFERRING DIAG: [phone] (ICD-10-CM) - Acute meniscal tear of left knee, subsequent encounter   Rationale for Evaluation and Treatment: Rehabilitation  THERAPY DIAG:  No diagnosis found.  ONSET DATE: DOS: 11/06/22   SUBJECTIVE:                                                                                                                                                                                           SUBJECTIVE STATEMENT: Patient is a 62 year old female who presents s/p left knee arthroscopy with meniscal debridement on 11/06/2022  PERTINENT HISTORY:  Asthma, HTN, thyroid disease (Hoshimotos), L arm and elbow  pain, hiatal hernai, vericose veins B LEs  PAIN:  Are you having pain? Yes: NPRS scale: ***/10 Pain location: *** Pain description: *** Aggravating factors: *** Relieving factors: ***  PRECAUTIONS:  None  RED FLAGS: None   WEIGHT BEARING RESTRICTIONS:  No  FALLS:  Has patient fallen in last 6 months? {fallsyesno:27318}  LIVING ENVIRONMENT: Lives with: {OPRC lives with:25569::"lives with their family"} Lives in: {Lives in:25570} Stairs: {opstairs:27293} Has following equipment at home: {Assistive devices:23999}  OCCUPATION:  ***  PLOF:  {PLOF:24004}  PATIENT GOALS:  ***   OBJECTIVE:   DIAGNOSTIC FINDINGS:  MRI: Grade 3 horizontal tear in the midbody of the medial meniscus with adjacent small peripheral parameniscal cyst.  PATIENT SURVEYS:  11/18/22 {rehab surveys:24030}  COGNITIVE STATUS: Within functional limits for tasks assessed   SENSATION: WFL  POSTURE:  {posture:25561}  GAIT: 11/18/22 Comments: ***   PALPATION: 11/18/22 ***  EDEMA: 11/18/22 ***   LOWER EXTREMITY ROM:     ROM Right eval Left eval  Knee flexion  A: *** P: ***  Knee extension  A: *** P: ***   (Blank rows = not tested)   LOWER EXTREMITY MMT:    MMT Right eval Left eval  Knee flexion    Knee extension     (Blank rows  = not tested)    SPECIAL TESTS:  11/18/22 {PT Special Tests:29286}  FUNCTIONAL TESTS:  11/18/22 {Functional tests:24029}   TREATMENT:                                                                                                                              DATE:  11/18/22 See HEP - demonstrated with trial reps performed PRN for comprehension     PATIENT EDUCATION:  Education details: HEP, *** Person educated: {Person educated:25204} Education method: {Education Method:25205} Education comprehension: {Education Comprehension:25206}  HOME EXERCISE PROGRAM: ***   ASSESSMENT:  CLINICAL IMPRESSION: Patient is a 62 y.o. female who was seen today for physical therapy evaluation and treatment for s/p Lt knee scope with meniscal debridement. She demonstrates *** affecting functional mobility.  She will benefit from PT to address deficits listed.     OBJECTIVE IMPAIRMENTS: {opptimpairments:25111}.   ACTIVITY LIMITATIONS: {activitylimitations:27494}  PARTICIPATION LIMITATIONS: {participationrestrictions:25113}  PERSONAL FACTORS: 3+ comorbidities: Asthma, HTN, thyroid disease (Hoshimotos), L arm and elbow pain, hiatal hernai, vericose veins B LEs  are also affecting patient's functional outcome.   REHAB POTENTIAL: {rehabpotential:25112}  CLINICAL DECISION MAKING: {clinical decision making:25114}  EVALUATION COMPLEXITY: {Evaluation complexity:25115}   GOALS: Goals reviewed with patient? Yes  SHORT TERM GOALS: Target date: ***  Independent with initial HEP Goal status: INITIAL  2.  *** Goal status: INITIAL  3.  *** Goal status: INITIAL   LONG TERM GOALS: Target date: ***  Independent with final HEP Goal status: INITIAL  2.  FOTO score improved to *** Goal status: INITIAL  3.  *** improved to *** for *** Goal status: INIITAL  4.  Report pain < ***/  10 with *** for improved function Goal status: INITIAL  5.  *** Goal status: INITIAL  6.  *** Goal  status: INITIAL     PLAN:  PT FREQUENCY: {rehab frequency:25116}  PT DURATION: {rehab duration:25117}  PLANNED INTERVENTIONS: {rehab planned interventions:25118::"97110-Therapeutic exercises","97530- Therapeutic [phone]- Neuromuscular re-education","97535- Self ZDGU","44034- Manual therapy"}.  PLAN FOR NEXT SESSION: ***   NEXT MD VISIT: Clarita Crane, PT, DPT 11/18/22 7:20 AM

## 2022-11-27 ENCOUNTER — Other Ambulatory Visit: Payer: Self-pay

## 2022-11-27 ENCOUNTER — Other Ambulatory Visit: Payer: Self-pay | Admitting: Sports Medicine

## 2022-11-27 ENCOUNTER — Ambulatory Visit: Payer: Managed Care, Other (non HMO) | Admitting: Physical Therapy

## 2022-11-27 ENCOUNTER — Encounter: Payer: Managed Care, Other (non HMO) | Admitting: Physician Assistant

## 2022-11-27 ENCOUNTER — Encounter: Payer: Self-pay | Admitting: Physical Therapy

## 2022-11-27 DIAGNOSIS — M25562 Pain in left knee: Secondary | ICD-10-CM

## 2022-11-27 MED ORDER — SULFAMETHOXAZOLE-TRIMETHOPRIM 800-160 MG PO TABS: 1.0000 | ORAL_TABLET | Freq: Two times a day (BID) | ORAL | 0 refills | Status: AC

## 2022-11-27 NOTE — Progress Notes (Signed)
I was called to the physical therapy office today to evaluate the knee.  She is status post left knee arthroscopy with meniscal debridement on 11/06/2022.  Here from this week she has been having increasing pain as well as some puffiness and redness around her incision sites.  There is concern for soft tissue infection, there is a small knee effusion but certainly not large. -I will start her on Bactrim to be taken twice daily for the next 10 days. -She needs to follow-up with Dr. Mahalia Longest on Monday, appointment was made -Return precautions provided, if she has worsening of her pain, development of fever or chills after starting antibiotic she needs to be evaluated in the urgent care/ED prior to her follow-up on Monday.  She is agreeable and understanding.  -Will continue remainder of care with her operating physicians, Dr. August Saucer and Karenann Cai.  Madelyn Brunner, DO Primary Care Sports Medicine Physician  [facility]  This note was dictated using Dragon naturally speaking software and may contain errors in syntax, spelling, or content which have not been identified prior to signing this note.

## 2022-11-27 NOTE — Therapy (Signed)
[facility] [address] [place], [place], [address] Phone: [phone]   Fax:  [phone]  Patient Details  Name: Henritta Mutz MRN: [id number] Date of Birth: [birth year] Referring Provider:  Julieanne Cotton, PA-C  Encounter Date: 11/27/2022   Pt arrived to clinic for PT eval, and reporting increased pain and swelling x 2-3 days, possible fever, and increased redness and tenderness at incision sites.  Assessed by Dr. Shon Baton as well, recommending antibiotics and to be seen by surgeon or PA on Monday 12/01/22.  Agreed to hold on PT eval until after being seen by MD, rescheduled eval for next week.  No charge for today's visit.   Clarita Crane, PT, DPT 11/27/22 2:08 PM   [facility] [address] [place], [place], [address] Phone: [phone]   Fax:  [phone]

## 2022-12-01 ENCOUNTER — Encounter: Payer: Self-pay | Admitting: Surgical

## 2022-12-01 ENCOUNTER — Ambulatory Visit (INDEPENDENT_AMBULATORY_CARE_PROVIDER_SITE_OTHER): Payer: Managed Care, Other (non HMO) | Admitting: Surgical

## 2022-12-01 VITALS — BP 120/78 | HR 77 | Temp 98.1°F

## 2022-12-01 DIAGNOSIS — S83207D Unspecified tear of unspecified meniscus, current injury, left knee, subsequent encounter: Secondary | ICD-10-CM

## 2022-12-01 NOTE — Progress Notes (Signed)
Post-Op Visit Note   Patient: Debra Adkins           Date of Birth: [birth year]           MRN: [id number] Visit Date: 12/01/2022 PCP: Eden Emms, NP   Assessment & Plan:  Chief Complaint:  Chief Complaint  Patient presents with   Left Knee - Follow-up    11/06/2022 left knee arthroscopy, meniscal debridement   Visit Diagnoses: No diagnosis found.  Plan: Patient is a 62 year old female who presents s/p left knee arthroscopy with meniscal debridement on 11/06/2022.  She was recently seen by Dr. Shon Baton while she was doing physical therapy on 11/27/2022 and he was concerned about possible soft tissue infection of the knee postoperatively so he placed Allana on Bactrim for 10 days.  She is here today for clinical recheck and states that she has increased pain in the knee that she mostly localizes to the medial aspect of the knee where her meniscal debridement was performed.  She does have some redness and swelling around the arthroscopy portal incisions but she denies any consistent fevers or chills.  She does occasionally have spells where she gets hot and breaks out into sweat but this is intermittent.  She is able to bear weight.  On exam, patient has small effusion that is consistent with last exam.  She has no calf tenderness.  Negative Homans' sign.  Able to perform straight leg raise without extensor lag.  She has about 0 degrees extension and greater than 90 degrees of knee flexion without any significant pain or guarding.  She ambulates around the room with minimal antalgia.  There is a little bit of eschar formation over the lateral portal incision which may be due to Vicryl suture material.  This was debrided of any potential Vicryl suture material and we will plan to see her back in 1 week for clinical recheck which will be about the time that she comes off antibiotics.  Call with any major concerns and strict return precautions were discussed with the patient today.  Follow-Up  Instructions: Return in about 1 week (around 12/08/2022).   Orders:  No orders of the defined types were placed in this encounter.  No orders of the defined types were placed in this encounter.   Imaging: No results found.  PMFS History: Patient Active Problem List   Diagnosis Date Noted   Pain in left knee 10/24/2022   Other insomnia 10/17/2022   Positive ANA (antinuclear antibody) 09/30/2022   Fall 08/05/2022   Skin lesion 08/05/2022   Obesity (BMI 30-39.9) 08/05/2022   Vitamin D deficiency 08/05/2022   Depression, major, single episode, mild (HCC) 05/19/2022   Multiple joint pain 05/19/2022   Lateral epicondylitis, left elbow 08/22/2021   Chronic venous insufficiency 08/02/2021   Left arm pain 06/05/2021   Lumbar pain 05/15/2021   Left elbow pain 05/15/2021   Preventative health care 05/15/2021   Varicose veins of both lower extremities with pain 05/15/2021   Hiatal hernia 05/02/2020   History of malignant carcinoid tumor of stomach 05/02/2020   B12 deficiency 10/20/2019   History of gastric surgery 10/20/2019   Hypothyroidism due to Hashimoto's thyroiditis 10/20/2019   Moderate persistent asthma without complication 10/20/2019   COVID-19 03/17/2019   Past Medical History:  Diagnosis Date   Allergy    Asthma    Blood transfusion without reported diagnosis    Carcinoid tumor of colon    Followed by Dr. Remi Haggard in [place]  Chicken pox    Constipation    Diverticulosis    Hypertension    Thyroid disease    hoshimotos's thyroiditis at age 83    Family History  Problem Relation Age of Onset   Heart disease Father    Hyperlipidemia Father    Other Father        Heart transplant   Diabetes Brother    Heart disease Brother    Testicular cancer Brother    Heart disease Brother    Heart attack Brother     Past Surgical History:  Procedure Laterality Date   ABDOMINAL HYSTERECTOMY     states took everything but cervix?   DG GALL BLADDER      LAPAROSCOPIC GASTRIC SLEEVE RESECTION     TENNIS ELBOW RELEASE/NIRSCHEL PROCEDURE Left 01/24/2022   Procedure: LEFT LATERAL EPICONDYLE DEBRIDEMENT, REPAIR AS INIDICATED;  Surgeon: Tarry Kos, MD;  Location: [facility];  Service: Orthopedics;  Laterality: Left;   THYROIDECTOMY     Social History   Occupational History   Not on file  Tobacco Use   Smoking status: Former    Current packs/day: 0.00    Average packs/day: 0.3 packs/day for 20.0 years (5.0 ttl pk-yrs)    Types: Cigarettes    Start date: 01/20/1985    Quit date: 01/20/2005    Years since quitting: 17.8    Passive exposure: Past   Smokeless tobacco: Never  Vaping Use   Vaping status: Never Used  Substance and Sexual Activity   Alcohol use: Yes    Alcohol/week: 6.0 standard drinks of alcohol    Types: 6 Glasses of wine per week   Drug use: Never   Sexual activity: Not Currently

## 2022-12-04 ENCOUNTER — Ambulatory Visit: Payer: Managed Care, Other (non HMO) | Admitting: Rehabilitative and Restorative Service Providers"

## 2022-12-08 ENCOUNTER — Ambulatory Visit (INDEPENDENT_AMBULATORY_CARE_PROVIDER_SITE_OTHER): Payer: Managed Care, Other (non HMO) | Admitting: Surgical

## 2022-12-08 ENCOUNTER — Encounter: Payer: Self-pay | Admitting: Surgical

## 2022-12-08 VITALS — Ht 60.0 in | Wt 174.0 lb

## 2022-12-08 DIAGNOSIS — S83207D Unspecified tear of unspecified meniscus, current injury, left knee, subsequent encounter: Secondary | ICD-10-CM

## 2022-12-08 NOTE — Progress Notes (Signed)
Post-Op Visit Note   Patient: Debra Adkins           Date of Birth: 07/09/1960           MRN: [id number] Visit Date: 12/08/2022 PCP: Eden Emms, NP   Assessment & Plan:  Chief Complaint:  Chief Complaint  Patient presents with   Left Knee - Routine Post Op    11/06/2022 left knee arthroscopy, meniscal debridement   Visit Diagnoses:  1. Acute meniscal tear of left knee, subsequent encounter     Plan: Patient is a 62 year old female who presents for reevaluation following left knee arthroscopy with medial meniscal debridement.  She has about a month out.  She had some concern by Dr. Shon Baton for possible postop infection and was placed on antibiotics.  She has finished that course of antibiotics and returns today after being seen about 1 week ago.  She denies any fevers or chills.  Pain is somewhat improved down from 9/10 to 6/10.  Swelling is improved.  She is ambulatory without assistance.  On exam, patient has no effusion.  She has moderate tenderness over the medial joint line.  No pain with stressing the MCL and no significant laxity at 0 or 20 degrees to valgus stressing's.  Portal incisions look to be healing well with no significant erythema or fluctuance.  No calf tenderness.  Negative Homans' sign.  Ambulates without significant antalgia.  Impression is postop pain in the area of her meniscal debridement.  Think that with moderate improvement over the last week and no significant sign of infection on either exam (today and last week), infection is fairly low likelihood.  Will have her continue with formal physical therapy and follow-up with Dr. August Saucer in 4 weeks but she was encouraged to reach out to the office in the meantime if she notices any worsening of symptoms or any signs of infection which we discussed.  Follow-Up Instructions: Return in about 4 weeks (around 01/05/2023).   Orders:  No orders of the defined types were placed in this encounter.  No orders of the  defined types were placed in this encounter.   Imaging: No results found.  PMFS History: Patient Active Problem List   Diagnosis Date Noted   Pain in left knee 10/24/2022   Other insomnia 10/17/2022   Positive ANA (antinuclear antibody) 09/30/2022   Fall 08/05/2022   Skin lesion 08/05/2022   Obesity (BMI 30-39.9) 08/05/2022   Vitamin D deficiency 08/05/2022   Depression, major, single episode, mild (HCC) 05/19/2022   Multiple joint pain 05/19/2022   Lateral epicondylitis, left elbow 08/22/2021   Chronic venous insufficiency 08/02/2021   Left arm pain 06/05/2021   Lumbar pain 05/15/2021   Left elbow pain 05/15/2021   Preventative health care 05/15/2021   Varicose veins of both lower extremities with pain 05/15/2021   Hiatal hernia 05/02/2020   History of malignant carcinoid tumor of stomach 05/02/2020   B12 deficiency 10/20/2019   History of gastric surgery 10/20/2019   Hypothyroidism due to Hashimoto's thyroiditis 10/20/2019   Moderate persistent asthma without complication 10/20/2019   COVID-19 03/17/2019   Past Medical History:  Diagnosis Date   Allergy    Asthma    Blood transfusion without reported diagnosis    Carcinoid tumor of colon    Followed by Dr. Remi Haggard in [place]   Chicken pox    Constipation    Diverticulosis    Hypertension    Thyroid disease    hoshimotos's thyroiditis at  age 24    Family History  Problem Relation Age of Onset   Heart disease Father    Hyperlipidemia Father    Other Father        Heart transplant   Diabetes Brother    Heart disease Brother    Testicular cancer Brother    Heart disease Brother    Heart attack Brother     Past Surgical History:  Procedure Laterality Date   ABDOMINAL HYSTERECTOMY     states took everything but cervix?   DG GALL BLADDER     LAPAROSCOPIC GASTRIC SLEEVE RESECTION     TENNIS ELBOW RELEASE/NIRSCHEL PROCEDURE Left 01/24/2022   Procedure: LEFT LATERAL EPICONDYLE DEBRIDEMENT, REPAIR AS  INIDICATED;  Surgeon: Tarry Kos, MD;  Location: [facility];  Service: Orthopedics;  Laterality: Left;   THYROIDECTOMY     Social History   Occupational History   Not on file  Tobacco Use   Smoking status: Former    Current packs/day: 0.00    Average packs/day: 0.3 packs/day for 20.0 years (5.0 ttl pk-yrs)    Types: Cigarettes    Start date: 01/20/1985    Quit date: 01/20/2005    Years since quitting: 17.8    Passive exposure: Past   Smokeless tobacco: Never  Vaping Use   Vaping status: Never Used  Substance and Sexual Activity   Alcohol use: Yes    Alcohol/week: 6.0 standard drinks of alcohol    Types: 6 Glasses of wine per week   Drug use: Never   Sexual activity: Not Currently

## 2022-12-29 ENCOUNTER — Ambulatory Visit (INDEPENDENT_AMBULATORY_CARE_PROVIDER_SITE_OTHER): Payer: Managed Care, Other (non HMO) | Admitting: Surgical

## 2022-12-29 DIAGNOSIS — M25462 Effusion, left knee: Secondary | ICD-10-CM | POA: Diagnosis not present

## 2023-01-04 ENCOUNTER — Encounter: Payer: Self-pay | Admitting: Surgical

## 2023-01-04 LAB — SYNOVIAL FLUID ANALYSIS, COMPLETE
Basophils, %: 0 %
Eosinophils-Synovial: 0 % (ref 0–2)
Lymphocytes-Synovial Fld: 48 % (ref 0–74)
Monocyte/Macrophage: 4 % (ref 0–69)
Neutrophil, Synovial: 48 % — ABNORMAL HIGH (ref 0–24)
Synoviocytes, %: 0 % (ref 0–15)
WBC, Synovial: 346 {cells}/uL — ABNORMAL HIGH (ref <150)

## 2023-01-04 LAB — ANAEROBIC AND AEROBIC CULTURE
AER RESULT:: NO GROWTH
MICRO NUMBER:: 15827192
MICRO NUMBER:: 15827193
SPECIMEN QUALITY:: ADEQUATE
SPECIMEN QUALITY:: ADEQUATE

## 2023-01-04 NOTE — Progress Notes (Signed)
Post-Op Visit Note   Patient: Debra Adkins           Date of Birth: 12/06/1960           MRN: [id number] Visit Date: 12/29/2022 PCP: Eden Emms, NP   Assessment & Plan:  Chief Complaint:  Chief Complaint  Patient presents with   Left Knee - Routine Post Op     11/06/2022 left knee arthroscopy, meniscal debridement     Visit Diagnoses:  1. Effusion, left knee     Plan: Patient is a 62 year old female who presents for reevaluation following left knee arthroscopy with medial meniscal debridement on 11/06/2022.  She reports since the last visit her medial pain has significantly improved but she has noticed lateral sided pain around the lateral portal over the last 5 to 6 days.  This prompted her to clean a pin with alcohol and inserted several millimeters into the portal to drain fluid; she did get some small amount of clearish fluid out but to her report it did not appear purulent.  She denies any fevers or chills.  She feels like she is limping due to the lateral sided pain.  She inserted this pin into her knee about 2 days ago.  Has not really helped much with her pain.  Pain has not gotten substantially worse in the last 2 days.  She is holding off on physical therapy yet till her knee pain improves more.  On exam, patient does have effusion.  Incisions look to be well-healed with no sinus tract or significant cellulitis surrounding either incision.  No calf tenderness.  Negative Homans' sign.  She is able to perform straight leg raise.  Markedly improved tenderness over the medial joint line compared with last exam.  Does have some tenderness over the lateral joint line and lateral portal as well as the patellar tendon region.  She has ultrasound exam today confirming presence of effusion in the suprapatellar pouch and there is some hypoechoic signal just deep to the patellar tendon in the region of Hoffa's fat pad.  This seems to be where most of her pain is based on palpation with  the probe during exam.  Impression is very low likelihood of postoperative infection just as we have discussed in the past.  However with the patient's concern and with her recent action of having stuck a pin into her knee superficially, think that it would be best to aspirate the knee and inject with Toradol along with sending this fluid off for synovial fluid analysis and definitive evidence of whether this knee is infected or not.  Results of this fluid analysis demonstrates no growth on cultures and no significantly elevated cell count concerning for septic arthritis.  Did discuss the concept of debriding the fat pad at the time of surgery and she is likely to have some soreness there over the next couple months on occasion.  Fortunately the medial sided pain she was having before surgery and immediately after surgery has substantially improved.Plan to follow-up in ~6 weeks with Dr August Saucer    Procedure Note  Patient: Debra Adkins             Date of Birth: 1960-04-13           MRN: [id number]             Visit Date: 12/29/2022  Procedures: Visit Diagnoses:  1. Effusion, left knee     Large Joint Inj: L knee on 12/29/2022 12:22 PM Indications:  diagnostic evaluation, joint swelling and pain Details: 18 G 1.5 in needle, superolateral approach  Arthrogram: No  Medications: 5 mL lidocaine 1 %; 4 mL bupivacaine 0.25 % Aspirate: 20 mL Outcome: tolerated well, no immediate complications  20 cc nonpurulent synovial fluid aspirated and sent off for fluid analysis.  4 cc of bupivacaine mixed with 1 cc of Toradol. Procedure, treatment alternatives, risks and benefits explained, specific risks discussed. Consent was given by the patient. Immediately prior to procedure a time out was called to verify the correct patient, procedure, equipment, support staff and site/side marked as required. Patient was prepped and draped in the usual sterile fashion.         Follow-Up Instructions: Return  Follow-up after synovial fluid analysis.   Orders:  Orders Placed This Encounter  Procedures   Anaerobic and Aerobic Culture   Synovial Fluid Analysis, Complete   No orders of the defined types were placed in this encounter.   Imaging: No results found.  PMFS History: Patient Active Problem List   Diagnosis Date Noted   Pain in left knee 10/24/2022   Other insomnia 10/17/2022   Positive ANA (antinuclear antibody) 09/30/2022   Fall 08/05/2022   Skin lesion 08/05/2022   Obesity (BMI 30-39.9) 08/05/2022   Vitamin D deficiency 08/05/2022   Depression, major, single episode, mild (HCC) 05/19/2022   Multiple joint pain 05/19/2022   Lateral epicondylitis, left elbow 08/22/2021   Chronic venous insufficiency 08/02/2021   Left arm pain 06/05/2021   Lumbar pain 05/15/2021   Left elbow pain 05/15/2021   Preventative health care 05/15/2021   Varicose veins of both lower extremities with pain 05/15/2021   Hiatal hernia 05/02/2020   History of malignant carcinoid tumor of stomach 05/02/2020   B12 deficiency 10/20/2019   History of gastric surgery 10/20/2019   Hypothyroidism due to Hashimoto's thyroiditis 10/20/2019   Moderate persistent asthma without complication 10/20/2019   COVID-19 03/17/2019   Past Medical History:  Diagnosis Date   Allergy    Asthma    Blood transfusion without reported diagnosis    Carcinoid tumor of colon    Followed by Dr. Remi Haggard in [place]   Chicken pox    Constipation    Diverticulosis    Hypertension    Thyroid disease    hoshimotos's thyroiditis at age 63    Family History  Problem Relation Age of Onset   Heart disease Father    Hyperlipidemia Father    Other Father        Heart transplant   Diabetes Brother    Heart disease Brother    Testicular cancer Brother    Heart disease Brother    Heart attack Brother     Past Surgical History:  Procedure Laterality Date   ABDOMINAL HYSTERECTOMY     states took everything but  cervix?   DG GALL BLADDER     LAPAROSCOPIC GASTRIC SLEEVE RESECTION     TENNIS ELBOW RELEASE/NIRSCHEL PROCEDURE Left 01/24/2022   Procedure: LEFT LATERAL EPICONDYLE DEBRIDEMENT, REPAIR AS INIDICATED;  Surgeon: Tarry Kos, MD;  Location: [facility];  Service: Orthopedics;  Laterality: Left;   THYROIDECTOMY     Social History   Occupational History   Not on file  Tobacco Use   Smoking status: Former    Current packs/day: 0.00    Average packs/day: 0.3 packs/day for 20.0 years (5.0 ttl pk-yrs)    Types: Cigarettes    Start date: 01/20/1985    Quit date: 01/20/2005  Years since quitting: 17.9    Passive exposure: Past   Smokeless tobacco: Never  Vaping Use   Vaping status: Never Used  Substance and Sexual Activity   Alcohol use: Yes    Alcohol/week: 6.0 standard drinks of alcohol    Types: 6 Glasses of wine per week   Drug use: Never   Sexual activity: Not Currently

## 2023-01-21 HISTORY — PX: KNEE SURGERY: SHX244

## 2023-02-09 ENCOUNTER — Ambulatory Visit: Payer: Managed Care, Other (non HMO) | Admitting: Orthopedic Surgery

## 2023-02-09 ENCOUNTER — Encounter: Payer: Self-pay | Admitting: Orthopedic Surgery

## 2023-02-09 DIAGNOSIS — M25462 Effusion, left knee: Secondary | ICD-10-CM

## 2023-02-09 MED ORDER — TRAMADOL HCL 50 MG PO TABS
50.0000 mg | ORAL_TABLET | Freq: Two times a day (BID) | ORAL | 0 refills | Status: DC | PRN
Start: 2023-02-09 — End: 2023-04-03

## 2023-02-09 MED ORDER — BUPIVACAINE HCL 0.25 % IJ SOLN
4.0000 mL | INTRAMUSCULAR | Status: AC | PRN
Start: 2022-12-29 — End: 2022-12-29
  Administered 2022-12-29: 4 mL via INTRA_ARTICULAR

## 2023-02-09 MED ORDER — LIDOCAINE HCL 1 % IJ SOLN
5.0000 mL | INTRAMUSCULAR | Status: AC | PRN
  Administered 2022-12-29: 5 mL

## 2023-02-09 NOTE — Addendum Note (Signed)
Addended by: Albertina Parr on: 02/09/2023 12:37 PM   Modules accepted: Orders

## 2023-02-09 NOTE — Progress Notes (Signed)
Post-Op Visit Note   Patient: Debra Adkins           Date of Birth: 1960-12-14           MRN: [id number] Visit Date: 02/09/2023 PCP: Eden Emms, NP   Assessment & Plan:  Chief Complaint:  Chief Complaint  Patient presents with   Left Knee - Pain   Visit Diagnoses:  1. Effusion, left knee     Plan: Marjean Donna is a 63 year old patient is about 3 months out left knee arthroscopy with partial medial meniscectomy.  Been having some swelling and pain in the knee with occasional fevers and chills.  Aspiration last month with Franky Macho demonstrated no infection in the left knee.  Toradol was injected and that helped her for several weeks.  On examination she has excellent range of motion of the knee with mild effusion.  Knee is aspirated today and we got about 20 cc which was serous.  No real cloudiness or white count which appeared elevated in the fluid.  Sent that off for Gram stain cell count aerobic and anaerobic culture and crystals.  Tramadol prescribed.  54-month return for clinical recheck.  Quad strengthening encouraged.  Follow-Up Instructions: No follow-ups on file.   Orders:  Orders Placed This Encounter  Procedures   Gram stain   Anaerobic and Aerobic Culture   Cell count + diff,  w/ cryst-synvl fld   Crystals, Fluid   Meds ordered this encounter  Medications   traMADol (ULTRAM) 50 MG tablet    Sig: Take 1 tablet (50 mg total) by mouth every 12 (twelve) hours as needed.    Dispense:  30 tablet    Refill:  0    Imaging: No results found.  PMFS History: Patient Active Problem List   Diagnosis Date Noted   Pain in left knee 10/24/2022   Other insomnia 10/17/2022   Positive ANA (antinuclear antibody) 09/30/2022   Fall 08/05/2022   Skin lesion 08/05/2022   Obesity (BMI 30-39.9) 08/05/2022   Vitamin D deficiency 08/05/2022   Depression, major, single episode, mild (HCC) 05/19/2022   Multiple joint pain 05/19/2022   Lateral epicondylitis, left elbow 08/22/2021    Chronic venous insufficiency 08/02/2021   Left arm pain 06/05/2021   Lumbar pain 05/15/2021   Left elbow pain 05/15/2021   Preventative health care 05/15/2021   Varicose veins of both lower extremities with pain 05/15/2021   Hiatal hernia 05/02/2020   History of malignant carcinoid tumor of stomach 05/02/2020   B12 deficiency 10/20/2019   History of gastric surgery 10/20/2019   Hypothyroidism due to Hashimoto's thyroiditis 10/20/2019   Moderate persistent asthma without complication 10/20/2019   COVID-19 03/17/2019   Past Medical History:  Diagnosis Date   Allergy    Asthma    Blood transfusion without reported diagnosis    Carcinoid tumor of colon    Followed by Dr. Remi Haggard in [place]   Chicken pox    Constipation    Diverticulosis    Hypertension    Thyroid disease    hoshimotos's thyroiditis at age 1    Family History  Problem Relation Age of Onset   Heart disease Father    Hyperlipidemia Father    Other Father        Heart transplant   Diabetes Brother    Heart disease Brother    Testicular cancer Brother    Heart disease Brother    Heart attack Brother     Past Surgical History:  Procedure Laterality Date   ABDOMINAL HYSTERECTOMY     states took everything but cervix?   DG GALL BLADDER     LAPAROSCOPIC GASTRIC SLEEVE RESECTION     TENNIS ELBOW RELEASE/NIRSCHEL PROCEDURE Left 01/24/2022   Procedure: LEFT LATERAL EPICONDYLE DEBRIDEMENT, REPAIR AS INIDICATED;  Surgeon: Tarry Kos, MD;  Location: [facility];  Service: Orthopedics;  Laterality: Left;   THYROIDECTOMY     Social History   Occupational History   Not on file  Tobacco Use   Smoking status: Former    Current packs/day: 0.00    Average packs/day: 0.3 packs/day for 20.0 years (5.0 ttl pk-yrs)    Types: Cigarettes    Start date: 01/20/1985    Quit date: 01/20/2005    Years since quitting: 18.0    Passive exposure: Past   Smokeless tobacco: Never  Vaping Use   Vaping status:  Never Used  Substance and Sexual Activity   Alcohol use: Yes    Alcohol/week: 6.0 standard drinks of alcohol    Types: 6 Glasses of wine per week   Drug use: Never   Sexual activity: Not Currently

## 2023-02-10 LAB — CELL COUNT + DIFF, W/O CRYST-SYNVL FLD
Basophils, %: 0 %
Eosinophils-Synovial: 0 % (ref 0–2)
Lymphocytes-Synovial Fld: 59 % (ref 0–74)
Monocyte/Macrophage: 20 % (ref 0–69)
Neutrophil, Synovial: 21 % (ref 0–24)
Synoviocytes, %: 0 % (ref 0–15)
WBC, Synovial: 254 {cells}/uL — ABNORMAL HIGH (ref <150)

## 2023-02-13 NOTE — Progress Notes (Signed)
Aspiration results ok pls calal thx

## 2023-02-15 LAB — ANAEROBIC AND AEROBIC CULTURE
AER RESULT:: NO GROWTH
GRAM STAIN:: NONE SEEN
MICRO NUMBER:: 15977185
MICRO NUMBER:: 15977186
SPECIMEN QUALITY:: ADEQUATE
SPECIMEN QUALITY:: ADEQUATE

## 2023-02-15 LAB — CRYSTALS, FLUID

## 2023-02-16 NOTE — Progress Notes (Signed)
Called patient no answer. Will call back later. Just need  to advise on message below per Dr. August Saucer.

## 2023-02-17 NOTE — Progress Notes (Signed)
Called patient she is aware

## 2023-02-18 ENCOUNTER — Ambulatory Visit: Payer: Managed Care, Other (non HMO) | Admitting: Orthopedic Surgery

## 2023-02-24 ENCOUNTER — Telehealth: Payer: Self-pay | Admitting: Internal Medicine

## 2023-02-24 NOTE — Telephone Encounter (Signed)
 Patient called to schedule her follow-up appointment with Dr. Jeannetta which was scheduled for 04/21/23.  Patient states at her last appointment with Dr. Jeannetta he wanted her to hold off starting medication until she had her left knee surgery.  Patient states she is now 3 months post-op and is requesting something to help with her all over body pain while she waits for her appointment.

## 2023-03-03 NOTE — Progress Notes (Signed)
thx

## 2023-04-01 ENCOUNTER — Ambulatory Visit: Payer: Self-pay | Admitting: Nurse Practitioner

## 2023-04-01 NOTE — Telephone Encounter (Signed)
 Noted I appreciate Dr Weber Cooks evaluation

## 2023-04-01 NOTE — Telephone Encounter (Signed)
  Chief Complaint: pelvic area pain Symptoms: pain Frequency: comes and goes   Disposition: [] ED /[] Urgent Care (no appt availability in office) / [x] Appointment(In office/virtual)/ []  [facility]/ [] Home Care/ [] Refused Recommended Disposition /[] [facility]/ []  Follow-up with PCP Additional Notes: Pt complaining of pelvic area pain on left side for nearly 2 weeks. Pt rates pain 7.95. Pt states no injury/fall and is unsure why this is happening. Pt states she may have Lupus and wonders if this is cause. Pt feels the worst pain when she tries to get up or sit down. Pt refused appt today due to transportation and availability tomorrow. Pt has appt 3/14 @ 0920 with Dr Ermalene Searing. rN gave care advice and pt verbalized understanding.               Copied from CRM [id number]. Topic: Clinical - Red Word Triage >> Apr 01, 2023  2:01 PM Elizebeth Brooking wrote: [facility] that prompted transfer to Nurse Triage: Patient called in stating she has been having a shooting pain kinda under her bikini bone Reason for Disposition  [1] MODERATE (e.g., interferes with normal activities) pelvic pain AND [2] pain comes and goes (cramps) AND [3] present > 24 hours  Answer Assessment - Initial Assessment Questions 1. LOCATION: "Where does it hurt?"      Left side of upper pelvis  2. RADIATION: "Does the pain shoot anywhere else?" (e.g., lower back, groin, thighs)     No  3. ONSET: "When did the pain begin?" (e.g., minutes, hours or days ago)      2 weeks 4. SUDDEN: "Gradual or sudden onset?"     Gradual  5. PATTERN "Does the pain come and go, or is it constant?"    - If constant: "Is it getting better, staying the same, or worsening?"      (Note: Constant means the pain never goes away completely; most serious pain is constant and gets worse over time)     - If intermittent: "How long does it last?" "Do you have pain now?"     (Note: Intermittent means the pain goes away completely between  bouts)     Comes and goes  6. SEVERITY: "How bad is the pain?"  (e.g., Scale 1-10; mild, moderate, or severe)   - MILD (1-3): doesn't interfere with normal activities, area soft and not tender to touch    - MODERATE (4-7): interferes with normal activities or awakens from sleep, abdomen tender to touch    - SEVERE (8-10): excruciating pain, doubled over, unable to do any normal activities      7.95 7. RECURRENT SYMPTOM: "Have you ever had this type of pelvic pain before?" If Yes, ask: "When was the last time?" and "What happened that time?"      no 8. CAUSE: "What do you think is causing the pelvic pain?"     Not sure  9. RELIEVING/AGGRAVATING FACTORS: "What makes it better or worse?" (e.g., activity/rest, sexual intercourse, voiding, passing stool)     Sit down/get up it gets worse  10. OTHER SYMPTOMS: "Has there been any other symptoms?" (e.g., fever, constipation, diarrhea, urine problems, vaginal bleeding, vaginal discharge, or vomiting?"       Nausea  Protocols used: Pelvic Pain - Female-A-AH

## 2023-04-03 ENCOUNTER — Encounter: Payer: Self-pay | Admitting: *Deleted

## 2023-04-03 ENCOUNTER — Ambulatory Visit: Admitting: Family Medicine

## 2023-04-03 ENCOUNTER — Other Ambulatory Visit: Payer: Self-pay | Admitting: Family Medicine

## 2023-04-03 ENCOUNTER — Encounter: Payer: Self-pay | Admitting: Family Medicine

## 2023-04-03 ENCOUNTER — Ambulatory Visit
Admission: RE | Admit: 2023-04-03 | Discharge: 2023-04-03 | Disposition: A | Discharge status: Home / Self Care | Source: Ambulatory Visit | Attending: Family Medicine | Admitting: Family Medicine

## 2023-04-03 VITALS — BP 122/80 | HR 67 | Temp 98.1°F | Ht 60.0 in | Wt 174.1 lb

## 2023-04-03 DIAGNOSIS — R1032 Left lower quadrant pain: Secondary | ICD-10-CM

## 2023-04-03 LAB — POC URINALSYSI DIPSTICK (AUTOMATED)
Bilirubin, UA: NEGATIVE
Glucose, UA: NEGATIVE
Ketones, UA: NEGATIVE
Leukocytes, UA: NEGATIVE
Nitrite, UA: NEGATIVE
Protein, UA: POSITIVE — AB
Spec Grav, UA: 1.015 (ref 1.010–1.025)
Urobilinogen, UA: 0.2 U/dL
pH, UA: 7.5 (ref 5.0–8.0)

## 2023-04-03 LAB — CBC WITH DIFFERENTIAL/PLATELET
Basophils Absolute: 0.1 10*3/uL (ref 0.0–0.1)
Basophils Relative: 0.8 % (ref 0.0–3.0)
Eosinophils Absolute: 0.2 10*3/uL (ref 0.0–0.7)
Eosinophils Relative: 2.7 % (ref 0.0–5.0)
HCT: 45.3 % (ref 36.0–46.0)
Hemoglobin: 15 g/dL (ref 12.0–15.0)
Lymphocytes Relative: 22.1 % (ref 12.0–46.0)
Lymphs Abs: 1.5 10*3/uL (ref 0.7–4.0)
MCHC: 33.1 g/dL (ref 30.0–36.0)
MCV: 93.4 fl (ref 78.0–100.0)
Monocytes Absolute: 0.3 10*3/uL (ref 0.1–1.0)
Monocytes Relative: 5 % (ref 3.0–12.0)
Neutro Abs: 4.6 10*3/uL (ref 1.4–7.7)
Neutrophils Relative %: 69.4 % (ref 43.0–77.0)
Platelets: 206 10*3/uL (ref 150.0–400.0)
RBC: 4.85 Mil/uL (ref 3.87–5.11)
RDW: 13.2 % (ref 11.5–15.5)
WBC: 6.6 10*3/uL (ref 4.0–10.5)

## 2023-04-03 LAB — LIPASE: Lipase: 13 U/L (ref 11.0–59.0)

## 2023-04-03 LAB — COMPREHENSIVE METABOLIC PANEL
ALT: 19 U/L (ref 0–35)
AST: 17 U/L (ref 0–37)
Albumin: 4.7 g/dL (ref 3.5–5.2)
Alkaline Phosphatase: 64 U/L (ref 39–117)
BUN: 14 mg/dL (ref 6–23)
CO2: 30 meq/L (ref 19–32)
Calcium: 9.6 mg/dL (ref 8.4–10.5)
Chloride: 104 meq/L (ref 96–112)
Creatinine, Ser: 0.72 mg/dL (ref 0.40–1.20)
GFR: 89.6 mL/min (ref >60.00)
Glucose, Bld: 101 mg/dL — ABNORMAL HIGH (ref 70–99)
Potassium: 4.1 meq/L (ref 3.5–5.1)
Sodium: 142 meq/L (ref 135–145)
Total Bilirubin: 0.6 mg/dL (ref 0.2–1.2)
Total Protein: 7.5 g/dL (ref 6.0–8.3)

## 2023-04-03 MED ORDER — IOPAMIDOL (ISOVUE-300) INJECTION 61%
500.0000 mL | Freq: Once | INTRAVENOUS | Status: AC | PRN
  Administered 2023-04-03: 100 mL via INTRAVENOUS

## 2023-04-03 MED ORDER — TRAMADOL HCL 50 MG PO TABS: 50.0000 mg | ORAL_TABLET | Freq: Three times a day (TID) | ORAL | 0 refills | Status: DC | PRN

## 2023-04-03 NOTE — Assessment & Plan Note (Addendum)
 Acute, patient with history of diverticulitis.  Symptoms very consistent with diverticulitis today in office... Pain is worsening and there is associated rebound.  No evidence of muscle strain or abdominal wall Urinalysis does show a trace amount of blood but no sign of urinary infection. Recommended clear liquid diet Given uncertainty of diagnosis we will proceed with abdominal CT contrast stat.  Patient is nontoxic with no red flags in office today.  She is well-hydrated and is keeping down liquids. No indication for hospital admission. Can use Tylenol for pain.  We discussed return and ER precautions.  If pain is increasing or she is unable to keep down liquids she will go to the emergency room.

## 2023-04-03 NOTE — Progress Notes (Signed)
 Patient ID: Debra Adkins, female    DOB: Feb 06, 1960, 63 y.o.   MRN: [id number]  This visit was conducted in person.  BP 122/80 (BP Location: Left Arm, Patient Position: Sitting, Cuff Size: Normal)   Pulse 67   Temp 98.1 F (36.7 C) (Temporal)   Ht 5' (1.524 m)   Wt 174 lb 2 oz (79 kg)   SpO2 98%   BMI 34.01 kg/m    CC:  Chief Complaint  Patient presents with   LLQ Pain    X 2 weeks    Subjective:   HPI: Debra Adkins is a 63 y.o. female patient of Matt cable, NP with history of hypothyroidism, moderate persistent asthma hiatal hernia and history of malignant carcinoid tumor of stomach presenting on 04/03/2023 for LLQ Pain (X 2 weeks)  She reports new onset left lower quadrant abdominal pain x 2 weeks. She reports the pain is intermittent.  About 8 out of 10 on pain scale at times.  Notes the pain most when she tries to get up or sit down.  Pain worsening in last week... now more constant.  Has chronic  diarrhea.. no blood in stool. No fever.. but hot flashes / seating 99.4 F... but in last 6 months. Spells of nausea but off and on for a while ( may be due to her carcinoid tumor history per pt), no emesis  No new exposure.   No urine change.  UA in office today clear except trace blood.  2016 colonoscopy showed few small mouthed diverticula. Per pt she has had diverticulitits in past.    Seeing rheumatologist... may have lupus Relevant past medical, surgical, family and social history reviewed and updated as indicated. Interim medical history since our last visit reviewed. Allergies and medications reviewed and updated. Outpatient Medications Prior to Visit  Medication Sig Dispense Refill   albuterol (VENTOLIN HFA) 108 (90 Base) MCG/ACT inhaler Inhale 2 puffs into the lungs every 6 (six) hours as needed for wheezing or shortness of breath. 18 each 1   clobetasol cream (TEMOVATE) 0.05 % Apply 1 Application topically 2 (two) times daily. 30 g 0   cyanocobalamin (VITAMIN  B12) 1000 MCG/ML injection INJECT 1 ML (1,000 MCG TOTAL) INTO THE SKIN EVERY 7 (SEVEN) DAYS. 12 mL 3   cyclobenzaprine (FLEXERIL) 5 MG tablet TAKE 1 TABLET BY MOUTH 2 TIMES DAILY AS NEEDED FOR UP TO 15 DAYS FOR MUSCLE SPASMS 30 tablet 1   levothyroxine (SYNTHROID) 125 MCG tablet TAKE 1 TAB EVERY MORNING ON EMPTY STOMACH W/ WATER ONLY. NO FOOD OR OTHER MEDICATIONS FOR 30 MINUTES 90 tablet 2   Pediatric Multiple Vitamins (FLINTSTONES MULTIVITAMIN PO) Take by mouth.     promethazine (PHENERGAN) 25 MG tablet Take 12.5-25 mg by mouth 3 (three) times daily as needed.     sertraline (ZOLOFT) 50 MG tablet Take 1 tablet (50 mg total) by mouth daily. 90 tablet 1   Syringe/Needle, Disp, 25G X 1" 1 ML MISC Use one syringe to administer B12 injection 25 each 1   traMADol (ULTRAM) 50 MG tablet Take 1 tablet (50 mg total) by mouth every 12 (twelve) hours as needed. 30 tablet 0   Vitamin D, Ergocalciferol, (DRISDOL) 1.25 MG (50000 UNIT) CAPS capsule Take 1 capsule (50,000 Units total) by mouth every 7 (seven) days. 12 capsule 0   fluticasone-salmeterol (WIXELA INHUB) 100-50 MCG/ACT AEPB INHALE 1 PUFF INTO THE LUNGS TWICE A DAY 180 each 1   oxyCODONE (ROXICODONE) 5 MG immediate release  tablet Take 1 tablet (5 mg total) by mouth every 12 (twelve) hours as needed for severe pain (pain score 7-10). 30 tablet 0   VITAMIN D PO Take by mouth. (Patient not taking: Reported on 09/30/2022)     No facility-administered medications prior to visit.     Per HPI unless specifically indicated in ROS section below Review of Systems  Constitutional:  Negative for fatigue and fever.  HENT:  Negative for congestion.   Eyes:  Negative for pain.  Respiratory:  Negative for cough and shortness of breath.   Cardiovascular:  Negative for chest pain, palpitations and leg swelling.  Gastrointestinal:  Negative for abdominal pain.  Genitourinary:  Negative for dysuria and vaginal bleeding.  Musculoskeletal:  Negative for back pain.   Neurological:  Negative for syncope, light-headedness and headaches.  Psychiatric/Behavioral:  Negative for dysphoric mood.    Objective:  BP 122/80 (BP Location: Left Arm, Patient Position: Sitting, Cuff Size: Normal)   Pulse 67   Temp 98.1 F (36.7 C) (Temporal)   Ht 5' (1.524 m)   Wt 174 lb 2 oz (79 kg)   SpO2 98%   BMI 34.01 kg/m   Wt Readings from Last 3 Encounters:  04/03/23 174 lb 2 oz (79 kg)  12/08/22 174 lb (78.9 kg)  10/24/22 174 lb 6.4 oz (79.1 kg)      Physical Exam Constitutional:      General: She is not in acute distress.    Appearance: Normal appearance. She is well-developed. She is not ill-appearing or toxic-appearing.  HENT:     Head: Normocephalic.     Right Ear: Hearing, tympanic membrane, ear canal and external ear normal. Tympanic membrane is not erythematous, retracted or bulging.     Left Ear: Hearing, tympanic membrane, ear canal and external ear normal. Tympanic membrane is not erythematous, retracted or bulging.     Nose: No mucosal edema or rhinorrhea.     Right Sinus: No maxillary sinus tenderness or frontal sinus tenderness.     Left Sinus: No maxillary sinus tenderness or frontal sinus tenderness.     Mouth/Throat:     Pharynx: Uvula midline.  Eyes:     General: Lids are normal. Lids are everted, no foreign bodies appreciated.     Conjunctiva/sclera: Conjunctivae normal.     Pupils: Pupils are equal, round, and reactive to light.  Neck:     Thyroid: No thyroid mass or thyromegaly.     Vascular: No carotid bruit.     Trachea: Trachea normal.  Cardiovascular:     Rate and Rhythm: Normal rate and regular rhythm.     Pulses: Normal pulses.     Heart sounds: Normal heart sounds, S1 normal and S2 normal. No murmur heard.    No friction rub. No gallop.  Pulmonary:     Effort: Pulmonary effort is normal. No tachypnea or respiratory distress.     Breath sounds: Normal breath sounds. No decreased breath sounds, wheezing, rhonchi or rales.   Abdominal:     General: Abdomen is protuberant. Bowel sounds are normal.     Palpations: Abdomen is soft. There is no splenomegaly, mass or pulsatile mass.     Tenderness: There is abdominal tenderness in the left lower quadrant. There is rebound. There is no right CVA tenderness, left CVA tenderness or guarding.     Hernia: No hernia is present.  Musculoskeletal:     Cervical back: Normal range of motion and neck supple.  Skin:  General: Skin is warm and dry.     Findings: No rash.  Neurological:     Mental Status: She is alert.  Psychiatric:        Mood and Affect: Mood is not anxious or depressed.        Speech: Speech normal.        Behavior: Behavior normal. Behavior is cooperative.        Thought Content: Thought content normal.        Judgment: Judgment normal.       Results for orders placed or performed in visit on 04/03/23  POCT Urinalysis Dipstick (Automated)   Collection Time: 04/03/23  9:29 AM  Result Value Ref Range   Color, UA Yellow    Clarity, UA Clear    Glucose, UA Negative Negative   Bilirubin, UA Negative    Ketones, UA Negative    Spec Grav, UA 1.015 1.010 - 1.025   Blood, UA Trace    pH, UA 7.5 5.0 - 8.0   Protein, UA Positive (A) Negative   Urobilinogen, UA 0.2 0.2 or 1.0 E.U./dL   Nitrite, UA Negative    Leukocytes, UA Negative Negative    Assessment and Plan  LLQ pain -     POCT Urinalysis Dipstick (Automated) -     Comprehensive metabolic panel -     Lipase -     CBC with Differential/Platelet  Left lower quadrant abdominal pain Assessment & Plan: Acute, patient with history of diverticulitis.  Symptoms very consistent with diverticulitis today in office... Pain is worsening and there is associated rebound.  No evidence of muscle strain or abdominal wall Urinalysis does show a trace amount of blood but no sign of urinary infection. Recommended clear liquid diet Given uncertainty of diagnosis we will proceed with abdominal CT  contrast stat.  Patient is nontoxic with no red flags in office today.  She is well-hydrated and is keeping down liquids. No indication for hospital admission. Can use Tylenol for pain.  We discussed return and ER precautions.  If pain is increasing or she is unable to keep down liquids she will go to the emergency room.   Orders: -     CT ABDOMEN PELVIS W CONTRAST; Future    No follow-ups on file.   Kerby Nora, MD

## 2023-04-08 NOTE — Progress Notes (Signed)
 Office Visit Note  Patient: Debra Adkins             Date of Birth: 10-24-1960           MRN: [id number]             PCP: Eden Emms, NP Referring: Eden Emms, NP Visit Date: 04/21/2023   Subjective:  Follow-up   History of Present Illness: Debra Adkins is a 63 y.o. female here for follow up for chronic joint pain and rashes with positive ANA.    Discussed the use of AI scribe software for clinical note transcription with the patient, who gave verbal consent to proceed.  History of Present Illness   Debra Adkins is a 63 year old female who presents with joint pain and headaches. She is accompanied by her husband.  She experiences joint pain in multiple areas, which has worsened following recent surgery. She has not initiated any new medications due to concerns about gastrointestinal health, especially after a recent colonoscopy confirmed diverticulitis. She reports significant morning stiffness lasting a couple of hours, difficulty with mobility, and weight gain due to reduced activity.  She underwent a meniscus repair, which took a long time to heal. She reports persistent issues with fluid accumulation in the knee, which was aspirated, and a scaly area that did not heal until she applied clobetasol. The knee remains painful, especially when descending stairs.  She describes experiencing headaches as a new symptom, located across her head and accompanied by a sensation of heat and sweating. She also reports balance issues and short-term memory problems, which her husband has noticed.  She reports chronic diarrhea for the past six months, which has been persistent and problematic.  She has a history of carcinoid tumors and reports flushing episodes, though these feel different from previous episodes. No recent breathing difficulties and she has not used her inhaler for some time.  She describes pain in her heel, which has been present for about two months. The pain is  persistent, sometimes throbbing, and is exacerbated by pressure. It is particularly painful at night.      Previous HPI 10/24/2022 Debra Adkins is a 63 y.o. female here for follow up for chronic joint pain and rashes with positive ANA.  Our initial exam was mostly unremarkable and lab test for systemic inflammation were all normal.  She is back today for follow-up to review labs also due to new onset of joint pain and swelling in her left knee.  This just started after standing up from a chair after a few steps felt a popping sensation in the knee and then the sensation that the joint was going to give way.  Since then has severe pain with pressure and with weightbearing on the medial side of the joint.   Previous HPI 09/30/22 Debra Adkins is a 63 y.o. female here for evaluation of positive ANA associated with joint pains fatigue sensitivity and rashes.  Medical history is also significant for hypothyroidism due to resection for Hashimoto's thyroiditis, gastric surgery for carcinoid tumor, moderate persistent asthma, and chronic venous insufficiency. She notices new and increased symptoms during the past 6 to 12 months.  Did not recall any specific event or medical change.  Main thing in the past few years had been a few episodes of COVID though none with severe complications.  She did sustain a fall landing on her left elbow in 2022 with associated left arm pain.  She had x-rays of the low  back and the elbow last year with mild degenerative changes but not particularly significant compared to symptoms.  Also had ultrasound exam ruling out upper extremity DVT due to persistent swelling.  Subsequent elbow MRI consistent with tendinosis or partial thickness tear with associated edema.  Besides this her main joint pain and she has been in the back and at the hips.  This is limiting her walking ability previously was very active walking 5 dogs now cannot tolerate a lot due to pain in her hip also with pain and  numbness in her toes.  She feels the left hip gets sharp pain anteriorly that comes and goes.  Also gets lateral hip pain bothers him trying to sleep.  Pain throughout her back from upper to lower.  She is also been experiencing a sensitivity to light touch and frequent burning painful sensation in her skin mostly in her torso and proximal upper body.  She was prescribed Flexeril for muscle spasms in June not currently on any other prescription medication for musculoskeletal or nerve pain. Evaluation in primary care office in April showed a high TSH and low vitamin D these were corrected by follow-up in July.  Also takes B12 supplementation. Has noticed skin rash with redness at the front of the neck and sometimes across the bridge of the nose and central face.  Most often sees this after waking up individual episodes lasting minutes or up to a few hours.  Does not particularly associated with sun exposure. She notices hair and fingernail thinning and brittleness.  Has dry eyes and mouth 1 recent sore on the inside of right cheek. Fingers and toes often cold or numb without visible discoloration. Sleep quality is very poor describes several hours trying to fall asleep each night despite feeling excessively tired.  Has never had a sleep study has been told that she snores this was apparently worse when she weighed more. She does report short term memory problems and poor concentration. She has diarrhea chronically. Some episodes of dizziness not having headaches.   Labs reviewed ANA 1:80 homogenous RF neg CCP neg MCV neg     Review of Systems  Constitutional:  Positive for fatigue.  HENT:  Positive for mouth sores and mouth dryness.   Eyes:  Positive for dryness.  Respiratory:  Positive for shortness of breath.   Cardiovascular:  Positive for chest pain and palpitations.  Gastrointestinal:  Positive for diarrhea. Negative for blood in stool and constipation.  Endocrine: Negative for increased  urination.  Genitourinary:  Negative for involuntary urination.  Musculoskeletal:  Positive for joint pain, gait problem, joint pain, joint swelling, myalgias, muscle weakness, morning stiffness, muscle tenderness and myalgias.  Skin:  Positive for color change, rash, hair loss and sensitivity to sunlight.  Allergic/Immunologic: Negative for susceptible to infections.  Neurological:  Positive for headaches. Negative for dizziness.  Hematological:  Positive for swollen glands.  Psychiatric/Behavioral:  Positive for depressed mood and sleep disturbance. The patient is not nervous/anxious.     PMFS History:  Patient Active Problem List   Diagnosis Date Noted   Enthesitis 04/21/2023   Left lower quadrant abdominal pain 04/03/2023   Pain in left knee 10/24/2022   Other insomnia 10/17/2022   Positive ANA (antinuclear antibody) 09/30/2022   Fall 08/05/2022   Skin lesion 08/05/2022   Obesity (BMI 30-39.9) 08/05/2022   Vitamin D deficiency 08/05/2022   Depression, major, single episode, mild (HCC) 05/19/2022   Multiple joint pain 05/19/2022   Lateral epicondylitis, left elbow  08/22/2021   Chronic venous insufficiency 08/02/2021   Left arm pain 06/05/2021   Lumbar pain 05/15/2021   Left elbow pain 05/15/2021   Preventative health care 05/15/2021   Varicose veins of both lower extremities with pain 05/15/2021   Hiatal hernia 05/02/2020   History of malignant carcinoid tumor of stomach 05/02/2020   B12 deficiency 10/20/2019   History of gastric surgery 10/20/2019   Hypothyroidism due to Hashimoto's thyroiditis 10/20/2019   Moderate persistent asthma without complication 10/20/2019   COVID-19 03/17/2019    Past Medical History:  Diagnosis Date   Allergy    Asthma    Blood transfusion without reported diagnosis    Carcinoid tumor of colon    Followed by Dr. Remi Haggard in [place]   Chicken pox    Constipation    Diverticulitis    Diverticulosis    Hypertension    Thyroid  disease    hoshimotos's thyroiditis at age 19    Family History  Problem Relation Age of Onset   Heart disease Father    Hyperlipidemia Father    Other Father        Heart transplant   Diabetes Brother    Heart disease Brother    Testicular cancer Brother    Heart disease Brother    Heart attack Brother    Past Surgical History:  Procedure Laterality Date   ABDOMINAL HYSTERECTOMY     states took everything but cervix?   DG GALL BLADDER     LAPAROSCOPIC GASTRIC SLEEVE RESECTION     TENNIS ELBOW RELEASE/NIRSCHEL PROCEDURE Left 01/24/2022   Procedure: LEFT LATERAL EPICONDYLE DEBRIDEMENT, REPAIR AS INIDICATED;  Surgeon: Tarry Kos, MD;  Location: [facility];  Service: Orthopedics;  Laterality: Left;   THYROIDECTOMY     Social History   Social History Narrative   05/02/20   From: [place], but moved when she was high school   Living: with Andrey Farmer, husband (2019) but together since 2005   Work: not currently due to health issues      Family: brother in [place]      Enjoys: rescue dogs - has 5      Exercise: not currently due to joint issues   Diet: low calorie, salads      Safety   Seat belts: Yes    Guns: Yes  and secure   Safe in relationships: Yes    Immunization History  Administered Date(s) Administered   Tdap 08/05/2022     Objective: Vital Signs: BP 129/82 (BP Location: Left Arm, Patient Position: Sitting, Cuff Size: Large)   Pulse 66   Resp 14   Ht 5' (1.524 m)   Wt 171 lb (77.6 kg)   BMI 33.40 kg/m    Physical Exam Eyes:     Conjunctiva/sclera: Conjunctivae normal.  Cardiovascular:     Rate and Rhythm: Normal rate and regular rhythm.  Pulmonary:     Effort: Pulmonary effort is normal.     Breath sounds: Normal breath sounds.  Lymphadenopathy:     Cervical: No cervical adenopathy.  Skin:    General: Skin is warm and dry.  Neurological:     Mental Status: She is alert.  Psychiatric:        Mood and Affect: Mood normal.       Musculoskeletal Exam:  Shoulders full ROM no tenderness or swelling Elbows full ROM no tenderness or swelling Wrists full ROM no tenderness or swelling Fingers full ROM no tenderness or swelling Increased  muscle tone in neck and some of back, very tense with mild guarding and tenderness, no radiation Knees full ROM no tenderness or swelling Ankles full ROM, bony nodules at achilles tendon insertions b/l    Investigation: No additional findings.  Imaging: CT ABDOMEN PELVIS W CONTRAST Result Date: 04/03/2023 CLINICAL DATA:  Left lower quadrant pain EXAM: CT ABDOMEN AND PELVIS WITH CONTRAST TECHNIQUE: Multidetector CT imaging of the abdomen and pelvis was performed using the standard protocol following bolus administration of intravenous contrast. RADIATION DOSE REDUCTION: This exam was performed according to the departmental dose-optimization program which includes automated exposure control, adjustment of the mA and/or kV according to patient size and/or use of iterative reconstruction technique. CONTRAST:  ISOVUE-300 IOPAMIDOL (ISOVUE-300) INJECTION 61% COMPARISON:  11/15/2019 FINDINGS: Lower chest: No acute abnormality. Hepatobiliary: Liver measures 19 cm in length. Diffusely decreased attenuation of the hepatic parenchyma. No focal liver lesion is identified. Prior cholecystectomy. No biliary dilatation. Pancreas: Unremarkable. No pancreatic ductal dilatation or surrounding inflammatory changes. Spleen: Normal in size without focal abnormality. Adrenals/Urinary Tract: Unremarkable adrenal glands. Kidneys enhance symmetrically without focal lesion, stone, or hydronephrosis. Ureters are nondilated. Urinary bladder appears unremarkable for the degree of distention. Stomach/Bowel: Postsurgical changes of sleeve gastrectomy. No dilated loops of bowel. Unremarkable appendix in the right lower quadrant. There are a few diverticula of the sigmoid colon. No focal bowel wall thickening or  inflammatory changes. Vascular/Lymphatic: No significant vascular findings are present. No enlarged abdominal or pelvic lymph nodes. Reproductive: Status post hysterectomy. No adnexal masses. Other: No free fluid. No abdominopelvic fluid collection. No pneumoperitoneum. No abdominal wall hernia. Musculoskeletal: No acute or significant osseous findings. Mild lumbar dextrocurvature. Lower lumbar facet arthropathy. IMPRESSION: 1. No acute abdominopelvic findings. 2. Mild sigmoid diverticulosis without evidence of acute diverticulitis. 3. Hepatic steatosis. Electronically Signed   By: Duanne Guess D.O.   On: 04/03/2023 14:23    Recent Labs: Lab Results  Component Value Date   WBC 6.6 04/03/2023   HGB 15.0 04/03/2023   PLT 206.0 04/03/2023   NA 142 04/03/2023   K 4.1 04/03/2023   CL 104 04/03/2023   CO2 30 04/03/2023   GLUCOSE 101 (H) 04/03/2023   BUN 14 04/03/2023   CREATININE 0.72 04/03/2023   BILITOT 0.6 04/03/2023   ALKPHOS 64 04/03/2023   AST 17 04/03/2023   ALT 19 04/03/2023   PROT 7.5 04/03/2023   ALBUMIN 4.7 04/03/2023   CALCIUM 9.6 04/03/2023    Speciality Comments: No specialty comments available.  Procedures:  No procedures performed Allergies: Chocolate and Codeine   Assessment / Plan:     Visit Diagnoses: Positive ANA (antinuclear antibody) - Initial plan to recommend trial of low-dose steroid medication with subsequent reassessment if highly beneficial could try addition of DMARD. - Plan: Sedimentation rate, C-reactive protein, C3 and C4, predniSONE (DELTASONE) 5 MG tablet Generalized joint pain and stiffness Reports generalized joint pain and stiffness, particularly in the mornings, affecting daily activities. Considering inflammatory arthritis but cautious about new medications due to recent surgery and healing. Not on arthritis medication due to gastrointestinal concerns. Blood tests to assess inflammation. Considering low-dose prednisone trial to evaluate response.  Prednisone risks include weight gain, increased blood sugar, and fluid retention. If prednisone shows improvement, may consider sulfasalazine, though it can cause nausea or diarrhea. - Recheck blood tests to assess inflammation - Consider trial of low-dose prednisone (5 mg) to evaluate response - Evaluate potential use of sulfasalazine if inflammation is confirmed and prednisone shows improvement  Acute  pain of left knee Post-meniscus repair knee effusion Underwent meniscus repair surgery with recurrent knee effusion. Fluid aspirated showed low white blood cell count of 300, indicating non-inflammatory effusion. Reports ongoing fluid accumulation and scaly skin, treated with clobetasol, showing some improvement. - Continue clobetasol application for scaly skin  Multiple joint pain Enthesitis Achilles tendonitis with bone spurring Pain and swelling at Achilles tendon attachment on heel, consistent with Achilles tendonitis and bone spurring. Persistent pain affects daily activities, including at rest. Considering imaging if symptoms persist. Evaluating response to low-dose prednisone for inflammation. - Consider imaging if symptoms persist - Evaluate response to low-dose prednisone for inflammation  Cervicogenic headache Experiences headaches likely cervicogenic, originating from cervical spine. Described as tension-type with increased muscle tone in paraspinal muscles causing nerve impingement. Reports associated symptoms of brain fog and short-term memory issues. Neurologist referral considered if headaches persist. Potential use of muscle relaxants or other interventions discussed. 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 Reports improvement in respiratory symptoms and no longer uses inhaler. Experiences frequent  diarrhea for six months. Considering impact of potential medications on carcinoid syndrome. - Monitor gastrointestinal symptoms - Consider impact of potential medications on carcinoid syndrome  Diverticulitis Diverticulitis confirmed by recent CT scan. Experiencing abdominal pain and has been on antibiotics. GI evaluation ongoing with multiple factors.    Orders: Orders Placed This Encounter  Procedures   Sedimentation rate   C-reactive protein   C3 and C4   Meds ordered this encounter  Medications   predniSONE (DELTASONE) 5 MG tablet    Sig: Take 2 tablets (10 mg total) by mouth daily with breakfast for 7 days, THEN 1 tablet (5 mg total) daily with breakfast for 14 days.    Dispense:  28 tablet    Refill:  0     Follow-Up Instructions: Return in about 2 months (around 06/21/2023) for ANA/?RA GC trial f/u 2mos.   Fuller Plan, MD  Note - This record has been created using [employer].  Chart creation errors have been sought, but may not always  have been located. Such creation errors do not reflect on  the standard of medical care.

## 2023-04-14 LAB — HM COLONOSCOPY

## 2023-04-21 ENCOUNTER — Encounter: Payer: Self-pay | Admitting: Internal Medicine

## 2023-04-21 ENCOUNTER — Ambulatory Visit: Payer: Managed Care, Other (non HMO) | Attending: Internal Medicine | Admitting: Internal Medicine

## 2023-04-21 VITALS — BP 129/82 | HR 66 | Resp 14 | Ht 60.0 in | Wt 171.0 lb

## 2023-04-21 DIAGNOSIS — M779 Enthesopathy, unspecified: Secondary | ICD-10-CM

## 2023-04-21 DIAGNOSIS — R768 Other specified abnormal immunological findings in serum: Secondary | ICD-10-CM

## 2023-04-21 DIAGNOSIS — M25562 Pain in left knee: Secondary | ICD-10-CM

## 2023-04-21 DIAGNOSIS — M2550 Pain in unspecified joint: Secondary | ICD-10-CM

## 2023-04-21 MED ORDER — PREDNISONE 5 MG PO TABS: ORAL_TABLET | ORAL | 0 refills | Status: AC

## 2023-04-22 LAB — C3 AND C4
C3 Complement: 143 mg/dL (ref 83–193)
C4 Complement: 26 mg/dL (ref 15–57)

## 2023-04-22 LAB — C-REACTIVE PROTEIN: CRP: <3 mg/L (ref <8.0)

## 2023-04-22 LAB — SEDIMENTATION RATE: Sed Rate: 6 mm/h (ref 0–30)

## 2023-04-24 ENCOUNTER — Other Ambulatory Visit: Payer: Self-pay

## 2023-04-24 ENCOUNTER — Emergency Department (HOSPITAL_BASED_OUTPATIENT_CLINIC_OR_DEPARTMENT_OTHER): Admitting: Radiology

## 2023-04-24 ENCOUNTER — Emergency Department (HOSPITAL_BASED_OUTPATIENT_CLINIC_OR_DEPARTMENT_OTHER)
Admission: EM | Admit: 2023-04-24 | Discharge: 2023-04-24 | Disposition: A | Discharge status: Home / Self Care | Attending: Emergency Medicine | Admitting: Emergency Medicine

## 2023-04-24 ENCOUNTER — Encounter (HOSPITAL_BASED_OUTPATIENT_CLINIC_OR_DEPARTMENT_OTHER): Payer: Self-pay | Admitting: Emergency Medicine

## 2023-04-24 DIAGNOSIS — W540XXA Bitten by dog, initial encounter: Secondary | ICD-10-CM | POA: Insufficient documentation

## 2023-04-24 DIAGNOSIS — S6991XA Unspecified injury of right wrist, hand and finger(s), initial encounter: Secondary | ICD-10-CM | POA: Diagnosis present

## 2023-04-24 DIAGNOSIS — S61411A Laceration without foreign body of right hand, initial encounter: Secondary | ICD-10-CM | POA: Diagnosis not present

## 2023-04-24 MED ORDER — LIDOCAINE HCL (PF) 1 % IJ SOLN
5.0000 mL | Freq: Once | INTRAMUSCULAR | Status: AC
  Administered 2023-04-24: 5 mL
  Filled 2023-04-24: qty 5

## 2023-04-24 MED ORDER — AMOXICILLIN-POT CLAVULANATE 875-125 MG PO TABS
1.0000 | ORAL_TABLET | Freq: Once | ORAL | Status: AC
  Administered 2023-04-24: 1 via ORAL
  Filled 2023-04-24: qty 1

## 2023-04-24 MED ORDER — LIDOCAINE-EPINEPHRINE-TETRACAINE (LET) TOPICAL GEL
3.0000 mL | Freq: Once | TOPICAL | Status: AC
Start: 2023-04-24 — End: 2023-04-24
  Administered 2023-04-24: 3 mL via TOPICAL
  Filled 2023-04-24: qty 3

## 2023-04-24 MED ORDER — AMOXICILLIN-POT CLAVULANATE 875-125 MG PO TABS
1.0000 | ORAL_TABLET | Freq: Two times a day (BID) | ORAL | 0 refills | Status: AC
Start: 2023-04-24 — End: 2023-05-04

## 2023-04-24 MED ORDER — OXYCODONE-ACETAMINOPHEN 5-325 MG PO TABS
1.0000 | ORAL_TABLET | Freq: Once | ORAL | Status: AC
  Administered 2023-04-24: 1 via ORAL
  Filled 2023-04-24: qty 1

## 2023-04-24 NOTE — ED Triage Notes (Signed)
 Pt caox4, ambulatory reporting she was trying to break up her dogs at home (husky and pit) when she was bit on her R hand and wrist. Bleeding controlled. Pt states dogs are up to date on vaccines. Motor and sensation in tact in fingers.

## 2023-04-24 NOTE — Discharge Instructions (Addendum)
 You can take Augmentin 2 times per day for the next 10 days.  Return to the emergency room if you notice any signs of infection surrounding the area.  Tomorrow, take down the dressing, gently wash area soap and water.  Apply antibiotic ointment, and a bandage over the area.  Follow-up with your PCP, urgent care, or return to the ED in 5 days to have the sutures looked at.

## 2023-04-24 NOTE — ED Notes (Signed)
 Discharge instructions reviewed.   Newly prescribed medications discussed. Pharmacy verified.   Opportunity for questions and concerns provided.   Alert, oriented and ambulatory.   Displays no signs of distress.   Encouraged to follow up in 5 days for suture follow up.

## 2023-04-24 NOTE — ED Notes (Signed)
 ED Provider at bedside.

## 2023-04-24 NOTE — ED Provider Notes (Signed)
 [place] EMERGENCY DEPARTMENT AT [facility] Provider Note   CSN: [id number] Arrival date & time: 04/24/23  1846     History  Chief Complaint  Patient presents with   Animal Bite    Debra Adkins is a 63 y.o. female.  This is a 63 year old female who is here today after she was bit by her dogs.  Patient was breaking up a fight between 2 of her dogs, one of them bit her right hand.  Patient states that both animals are up-to-date on their shots.   Animal Bite      Home Medications Prior to Admission medications   Medication Sig Start Date End Date Taking? Authorizing Provider  albuterol (VENTOLIN HFA) 108 (90 Base) MCG/ACT inhaler Inhale 2 puffs into the lungs every 6 (six) hours as needed for wheezing or shortness of breath. 03/20/22   Piontek, Denny Peon, MD  clobetasol cream (TEMOVATE) 0.05 % Apply 1 Application topically 2 (two) times daily. 08/05/22   Eden Emms, NP  cyanocobalamin (VITAMIN B12) 1000 MCG/ML injection INJECT 1 ML (1,000 MCG TOTAL) INTO THE SKIN EVERY 7 (SEVEN) DAYS. 08/14/22   Eden Emms, NP  cyclobenzaprine (FLEXERIL) 5 MG tablet TAKE 1 TABLET BY MOUTH 2 TIMES DAILY AS NEEDED FOR UP TO 15 DAYS FOR MUSCLE SPASMS 07/11/22   Eden Emms, NP  levothyroxine (SYNTHROID) 125 MCG tablet TAKE 1 TAB EVERY MORNING ON EMPTY STOMACH W/ WATER ONLY. NO FOOD OR OTHER MEDICATIONS FOR 30 MINUTES 07/25/22   Eden Emms, NP  OXYCODONE HCL PO Take by mouth. As needed for pain    [provider]  Pediatric Multiple Vitamins (FLINTSTONES MULTIVITAMIN PO) Take by mouth.    [provider]  predniSONE (DELTASONE) 5 MG tablet Take 2 tablets (10 mg total) by mouth daily with breakfast for 7 days, THEN 1 tablet (5 mg total) daily with breakfast for 14 days. 04/21/23 05/12/23  Fuller Plan, MD  promethazine (PHENERGAN) 25 MG tablet Take 12.5-25 mg by mouth 3 (three) times daily as needed. 04/16/20   [provider]  sertraline (ZOLOFT) 50 MG  tablet Take 1 tablet (50 mg total) by mouth daily. 09/19/22   Eden Emms, NP  Syringe/Needle, Disp, 25G X 1" 1 ML MISC Use one syringe to administer B12 injection 06/05/21   Eden Emms, NP  traMADol (ULTRAM) 50 MG tablet Take 1 tablet (50 mg total) by mouth every 8 (eight) hours as needed for moderate pain (pain score 4-6). 04/03/23   Bedsole, Amy E, MD  Vitamin D, Ergocalciferol, (DRISDOL) 1.25 MG (50000 UNIT) CAPS capsule Take 1 capsule (50,000 Units total) by mouth every 7 (seven) days. 07/28/22   Eden Emms, NP      Allergies    Chocolate and Codeine    Review of Systems   Review of Systems  Physical Exam Updated Vital Signs BP (!) 162/92 (BP Location: Left Arm)   Pulse 93   Temp 97.6 F (36.4 C) (Oral)   Resp 20   Ht 5' (1.524 m)   Wt 77.6 kg   SpO2 100%   BMI 33.40 kg/m  Physical Exam Vitals and nursing note reviewed.  Skin:    Comments: In the right thenar eminence, there is a complex triangular laceration.  It is 6.2 cm x 4.5 cm x 3.0 cm.  There is also a bite mark on the dorsum of the right wrist  Neurological:     Mental Status: She is alert.  ED Results / Procedures / Treatments   Labs (all labs ordered are listed, but only abnormal results are displayed) Labs Reviewed - No data to display  EKG None  Radiology DG Hand Complete Right Result Date: 04/24/2023 CLINICAL DATA:  Injury, laceration EXAM: RIGHT HAND - COMPLETE 3+ VIEW COMPARISON:  None Available. FINDINGS: Findings there is a soft tissue laceration in the base of the first metacarpal. There is some soft tissue air in this region. No definite radiopaque foreign body identified. No acute fracture or dislocation. IMPRESSION: 1. Soft tissue laceration overlying the first metacarpal. 2. No acute fracture or dislocation. Electronically Signed   By: Darliss Cheney M.D.   On: 04/24/2023 20:00    Procedures .Laceration Repair  Date/Time: 04/24/2023 9:02 PM  Performed by: Arletha Pili,  DO Authorized by: Anders Simmonds T, DO   Laceration details:    Length (cm):  6.2 Comments:     Area anesthetized using let gel.  Copiously irrigated with 3 L of sterile saline and Betadine.  Given the size of the wound, decision was made to loosely approximate to prevent secondary infection.  I placed a total of 6 simple interrupted sutures using 3-0 Ethilon.  Loose approximation achieved.  Patient tolerated procedure well.     Medications Ordered in ED Medications  lidocaine-EPINEPHrine-tetracaine (LET) topical gel (3 mLs Topical Given 04/24/23 1916)  oxyCODONE-acetaminophen (PERCOCET/ROXICET) 5-325 MG per tablet 1 tablet (1 tablet Oral Given 04/24/23 1916)  lidocaine (PF) (XYLOCAINE) 1 % injection 5 mL (5 mLs Other Given 04/24/23 1957)    ED Course/ Medical Decision Making/ A&P                                 Medical Decision Making 63 year old female here today after she was bit by a dog.  Plan-patient up-to-date on tetanus.  X-ray negative.  Given the size of the wound on the thenar eminence, believed it was better to loosely approximate the area after copious irrigation.  Patient Toller procedure well.  Gave strict return precautions to the patient.  Will discharge with Augmentin.  First dose received here.  Will discharge.  Amount and/or Complexity of Data Reviewed Radiology: ordered.  Risk Prescription drug management.           Final Clinical Impression(s) / ED Diagnoses Final diagnoses:  None    Rx / DC Orders ED Discharge Orders     None         Arletha Pili, DO 04/24/23 2107

## 2023-05-01 ENCOUNTER — Other Ambulatory Visit: Payer: Self-pay | Admitting: Nurse Practitioner

## 2023-05-01 DIAGNOSIS — F320 Major depressive disorder, single episode, mild: Secondary | ICD-10-CM

## 2023-05-01 DIAGNOSIS — E063 Autoimmune thyroiditis: Secondary | ICD-10-CM

## 2023-05-13 ENCOUNTER — Other Ambulatory Visit: Payer: Self-pay | Admitting: Internal Medicine

## 2023-05-13 DIAGNOSIS — R768 Other specified abnormal immunological findings in serum: Secondary | ICD-10-CM

## 2023-05-25 ENCOUNTER — Encounter: Payer: Self-pay | Admitting: Nurse Practitioner

## 2023-05-25 ENCOUNTER — Other Ambulatory Visit: Payer: Self-pay | Admitting: Nurse Practitioner

## 2023-05-25 DIAGNOSIS — Z1231 Encounter for screening mammogram for malignant neoplasm of breast: Secondary | ICD-10-CM

## 2023-05-29 ENCOUNTER — Encounter: Payer: Self-pay | Admitting: Family Medicine

## 2023-05-29 ENCOUNTER — Ambulatory Visit (INDEPENDENT_AMBULATORY_CARE_PROVIDER_SITE_OTHER): Admitting: Family Medicine

## 2023-05-29 VITALS — BP 130/80 | HR 57 | Temp 98.4°F | Ht 60.0 in | Wt 168.4 lb

## 2023-05-29 DIAGNOSIS — N644 Mastodynia: Secondary | ICD-10-CM

## 2023-05-29 NOTE — Patient Instructions (Signed)
 Decrease alcohol and caffeine as these can cause breast pain.  Call to set up diagnostic mammogram.

## 2023-05-29 NOTE — Progress Notes (Signed)
 Patient ID: Debra Adkins, female    DOB: 04-06-1960, 63 y.o.   MRN: [id number]  This visit was conducted in person.  BP 130/80   Pulse (!) 57   Temp 98.4 F (36.9 C) (Temporal)   Ht 5' (1.524 m)   Wt 168 lb 6 oz (76.4 kg)   SpO2 95%   BMI 32.88 kg/m    CC:  Chief Complaint  Patient presents with   Breast Pain    Sharp/Stabbing pain in bilateral breast    Subjective:   HPI: Debra Adkins is a 63 y.o. female patient of Matt cable NP presenting on 05/29/2023 for Breast Pain (Sharp/Stabbing pain in bilateral breast)  She reports new onset sharp stabbing pains in bilateral breasts ongoing in the last 1-2 months.  Off and on.  No new meds.  Drinks one cup coffee a day.  Has noted  more lumpiness in both breasts.   Not on hormones.  Last mammogram Jun 18, 2021 no suspicious findings.  History of dense breast tissue.  Relevant past medical, surgical, family and social history reviewed and updated as indicated. Interim medical history since our last visit reviewed. Allergies and medications reviewed and updated. Outpatient Medications Prior to Visit  Medication Sig Dispense Refill   albuterol  (VENTOLIN  HFA) 108 (90 Base) MCG/ACT inhaler Inhale 2 puffs into the lungs every 6 (six) hours as needed for wheezing or shortness of breath. 18 each 1   clobetasol  cream (TEMOVATE ) 0.05 % Apply 1 Application topically 2 (two) times daily. 30 g 0   cyanocobalamin  (VITAMIN B12) 1000 MCG/ML injection INJECT 1 ML (1,000 MCG TOTAL) INTO THE SKIN EVERY 7 (SEVEN) DAYS. 12 mL 3   cyclobenzaprine  (FLEXERIL ) 5 MG tablet TAKE 1 TABLET BY MOUTH 2 TIMES DAILY AS NEEDED FOR UP TO 15 DAYS FOR MUSCLE SPASMS 30 tablet 1   levothyroxine  (SYNTHROID ) 125 MCG tablet TAKE 1 TAB EVERY MORNING ON EMPTY STOMACH W/ WATER ONLY. NO FOOD OR OTHER MEDICATIONS FOR 30 MINUTES 90 tablet 2   OXYCODONE  HCL PO Take by mouth. As needed for pain     Pediatric Multiple Vitamins (FLINTSTONES MULTIVITAMIN PO) Take by mouth.      promethazine  (PHENERGAN ) 25 MG tablet Take 12.5-25 mg by mouth 3 (three) times daily as needed.     sertraline  (ZOLOFT ) 50 MG tablet TAKE 1 TABLET BY MOUTH EVERY DAY 90 tablet 1   Syringe/Needle, Disp, 25G X 1" 1 ML MISC Use one syringe to administer B12 injection 25 each 1   Vitamin D , Ergocalciferol , (DRISDOL ) 1.25 MG (50000 UNIT) CAPS capsule Take 1 capsule (50,000 Units total) by mouth every 7 (seven) days. 12 capsule 0   traMADol  (ULTRAM ) 50 MG tablet Take 1 tablet (50 mg total) by mouth every 8 (eight) hours as needed for moderate pain (pain score 4-6). 20 tablet 0   No facility-administered medications prior to visit.     Per HPI unless specifically indicated in ROS section below Review of Systems  Constitutional:  Negative for fatigue and fever.  HENT:  Negative for congestion.   Eyes:  Negative for pain.  Respiratory:  Negative for cough and shortness of breath.   Cardiovascular:  Negative for chest pain, palpitations and leg swelling.  Gastrointestinal:  Negative for abdominal pain.  Genitourinary:  Negative for dysuria and vaginal bleeding.  Musculoskeletal:  Negative for back pain.  Neurological:  Negative for syncope, light-headedness and headaches.  Psychiatric/Behavioral:  Negative for dysphoric mood.    Objective:  BP 130/80   Pulse (!) 57   Temp 98.4 F (36.9 C) (Temporal)   Ht 5' (1.524 m)   Wt 168 lb 6 oz (76.4 kg)   SpO2 95%   BMI 32.88 kg/m   Wt Readings from Last 3 Encounters:  05/29/23 168 lb 6 oz (76.4 kg)  04/24/23 171 lb (77.6 kg)  04/21/23 171 lb (77.6 kg)      Physical Exam Constitutional:      General: She is not in acute distress.    Appearance: She is well-developed. She is not ill-appearing or toxic-appearing.  HENT:     Head: Normocephalic.     Right Ear: Hearing, tympanic membrane, ear canal and external ear normal. Tympanic membrane is not erythematous, retracted or bulging.     Left Ear: Hearing, tympanic membrane, ear canal and  external ear normal. Tympanic membrane is not erythematous, retracted or bulging.     Nose: Mucosal edema and rhinorrhea present.     Right Sinus: No maxillary sinus tenderness or frontal sinus tenderness.     Left Sinus: No maxillary sinus tenderness or frontal sinus tenderness.     Mouth/Throat:     Pharynx: Uvula midline.  Eyes:     General: Lids are normal. Lids are everted, no foreign bodies appreciated.     Conjunctiva/sclera: Conjunctivae normal.     Pupils: Pupils are equal, round, and reactive to light.  Neck:     Thyroid : No thyroid  mass or thyromegaly.     Vascular: No carotid bruit.     Trachea: Trachea normal.  Cardiovascular:     Rate and Rhythm: Normal rate and regular rhythm.     Pulses: Normal pulses.     Heart sounds: Normal heart sounds, S1 normal and S2 normal. No murmur heard.    No friction rub. No gallop.  Pulmonary:     Effort: Pulmonary effort is normal. No tachypnea or respiratory distress.     Breath sounds: Normal breath sounds. No decreased breath sounds, wheezing, rhonchi or rales.  Chest:  Breasts:    Right: Tenderness present.     Left: Tenderness present.     Comments: Multiple fibrocystic changes in bilateral breasts, diffusely tender on lower aspect of bilateral breasts. Musculoskeletal:     Cervical back: Normal range of motion and neck supple.  Skin:    General: Skin is warm and dry.     Findings: No rash.  Neurological:     Mental Status: She is alert.  Psychiatric:        Mood and Affect: Mood is not anxious or depressed.        Speech: Speech normal.        Behavior: Behavior normal. Behavior is cooperative.        Judgment: Judgment normal.       Results for orders placed or performed in visit on 04/21/23  Sedimentation rate   Collection Time: 04/21/23 10:49 AM  Result Value Ref Range   Sed Rate 6 0 - 30 mm/h  C-reactive protein   Collection Time: 04/21/23 10:49 AM  Result Value Ref Range   CRP <3.0 <8.0 mg/L  C3 and C4    Collection Time: 04/21/23 10:49 AM  Result Value Ref Range   C3 Complement 143 83 - 193 mg/dL   C4 Complement 26 15 - 57 mg/dL    Assessment and Plan  Breast pain -     US  LIMITED ULTRASOUND INCLUDING AXILLA RIGHT BREAST; Future -  US  LIMITED ULTRASOUND INCLUDING AXILLA LEFT BREAST ; Future -     MM 3D DIAGNOSTIC MAMMOGRAM BILATERAL BREAST; Future    No follow-ups on file.   Herby Lolling, MD

## 2023-06-09 ENCOUNTER — Encounter (INDEPENDENT_AMBULATORY_CARE_PROVIDER_SITE_OTHER): Payer: Self-pay

## 2023-06-17 NOTE — Progress Notes (Deleted)
 Office Visit Note  Patient: Debra Adkins             Date of Birth: April 03, 1960           MRN: [id number]             PCP: Dorothe Gaster, NP Referring: Dorothe Gaster, NP Visit Date: 06/29/2023   Subjective:  No chief complaint on file.   History of Present Illness: Debra Adkins is a 63 y.o. female here for follow up with joint pain and headaches.    Previous HPI 04/21/2023 Debra Adkins is a 63 year old female who presents with joint pain and headaches. She is accompanied by her husband.   She experiences joint pain in multiple areas, which has worsened following recent surgery. She has not initiated any new medications due to concerns about gastrointestinal health, especially after a recent colonoscopy confirmed diverticulitis. She reports significant morning stiffness lasting a couple of hours, difficulty with mobility, and weight gain due to reduced activity.   She underwent a meniscus repair, which took a long time to heal. She reports persistent issues with fluid accumulation in the knee, which was aspirated, and a scaly area that did not heal until she applied clobetasol . The knee remains painful, especially when descending stairs.   She describes experiencing headaches as a new symptom, located across her head and accompanied by a sensation of heat and sweating. She also reports balance issues and short-term memory problems, which her husband has noticed.   She reports chronic diarrhea for the past six months, which has been persistent and problematic.   She has a history of carcinoid tumors and reports flushing episodes, though these feel different from previous episodes. No recent breathing difficulties and she has not used her inhaler for some time.   She describes pain in her heel, which has been present for about two months. The pain is persistent, sometimes throbbing, and is exacerbated by pressure. It is particularly painful at night.       Previous  HPI 10/24/2022 Debra Adkins is a 63 y.o. female here for follow up for chronic joint pain and rashes with positive ANA.  Our initial exam was mostly unremarkable and lab test for systemic inflammation were all normal.  She is back today for follow-up to review labs also due to new onset of joint pain and swelling in her left knee.  This just started after standing up from a chair after a few steps felt a popping sensation in the knee and then the sensation that the joint was going to give way.  Since then has severe pain with pressure and with weightbearing on the medial side of the joint.   Previous HPI 09/30/22 Debra Adkins is a 63 y.o. female here for evaluation of positive ANA associated with joint pains fatigue sensitivity and rashes.  Medical history is also significant for hypothyroidism due to resection for Hashimoto's thyroiditis, gastric surgery for carcinoid tumor, moderate persistent asthma, and chronic venous insufficiency. She notices new and increased symptoms during the past 6 to 12 months.  Did not recall any specific event or medical change.  Main thing in the past few years had been a few episodes of COVID though none with severe complications.  She did sustain a fall landing on her left elbow in 2022 with associated left arm pain.  She had x-rays of the low back and the elbow last year with mild degenerative changes but not particularly significant compared to symptoms.  Also had ultrasound exam ruling out upper extremity DVT due to persistent swelling.  Subsequent elbow MRI consistent with tendinosis or partial thickness tear with associated edema.  Besides this her main joint pain and she has been in the back and at the hips.  This is limiting her walking ability previously was very active walking 5 dogs now cannot tolerate a lot due to pain in her hip also with pain and numbness in her toes.  She feels the left hip gets sharp pain anteriorly that comes and goes.  Also gets lateral hip  pain bothers him trying to sleep.  Pain throughout her back from upper to lower.  She is also been experiencing a sensitivity to light touch and frequent burning painful sensation in her skin mostly in her torso and proximal upper body.  She was prescribed Flexeril  for muscle spasms in June not currently on any other prescription medication for musculoskeletal or nerve pain. Evaluation in primary care office in April showed a high TSH and low vitamin D  these were corrected by follow-up in July.  Also takes B12 supplementation. Has noticed skin rash with redness at the front of the neck and sometimes across the bridge of the nose and central face.  Most often sees this after waking up individual episodes lasting minutes or up to a few hours.  Does not particularly associated with sun exposure. She notices hair and fingernail thinning and brittleness.  Has dry eyes and mouth 1 recent sore on the inside of right cheek. Fingers and toes often cold or numb without visible discoloration. Sleep quality is very poor describes several hours trying to fall asleep each night despite feeling excessively tired.  Has never had a sleep study has been told that she snores this was apparently worse when she weighed more. She does report short term memory problems and poor concentration. She has diarrhea chronically. Some episodes of dizziness not having headaches.   Labs reviewed ANA 1:80 homogenous RF neg CCP neg MCV neg   No Rheumatology ROS completed.   PMFS History:  Patient Active Problem List   Diagnosis Date Noted   Enthesitis 04/21/2023   Left lower quadrant abdominal pain 04/03/2023   Pain in left knee 10/24/2022   Other insomnia 10/17/2022   Positive ANA (antinuclear antibody) 09/30/2022   Fall 08/05/2022   Skin lesion 08/05/2022   Obesity (BMI 30-39.9) 08/05/2022   Vitamin D  deficiency 08/05/2022   Depression, major, single episode, mild (HCC) 05/19/2022   Multiple joint pain 05/19/2022    Lateral epicondylitis, left elbow 08/22/2021   Chronic venous insufficiency 08/02/2021   Left arm pain 06/05/2021   Lumbar pain 05/15/2021   Left elbow pain 05/15/2021   Preventative health care 05/15/2021   Varicose veins of both lower extremities with pain 05/15/2021   Hiatal hernia 05/02/2020   History of malignant carcinoid tumor of stomach 05/02/2020   B12 deficiency 10/20/2019   History of gastric surgery 10/20/2019   Hypothyroidism due to Hashimoto's thyroiditis 10/20/2019   Moderate persistent asthma without complication 10/20/2019   COVID-19 03/17/2019    Past Medical History:  Diagnosis Date   Allergy    Asthma    Blood transfusion without reported diagnosis    Carcinoid tumor of colon    Followed by Dr. Anil Tumbarpurra in [place]   Chicken pox    Constipation    Diverticulitis    Diverticulosis    Hypertension    Thyroid  disease    hoshimotos's thyroiditis at age 35  Family History  Problem Relation Age of Onset   Heart disease Father    Hyperlipidemia Father    Other Father        Heart transplant   Diabetes Brother    Heart disease Brother    Testicular cancer Brother    Heart disease Brother    Heart attack Brother    Past Surgical History:  Procedure Laterality Date   ABDOMINAL HYSTERECTOMY     states took everything but cervix?   DG GALL BLADDER     LAPAROSCOPIC GASTRIC SLEEVE RESECTION     TENNIS ELBOW RELEASE/NIRSCHEL PROCEDURE Left 01/24/2022   Procedure: LEFT LATERAL EPICONDYLE DEBRIDEMENT, REPAIR AS INIDICATED;  Surgeon: Wes Hamman, MD;  Location: [facility];  Service: Orthopedics;  Laterality: Left;   THYROIDECTOMY     Social History   Social History Narrative   05/02/20   From: [place] , but moved when she was high school   Living: with [place], husband (2019) but together since 2005   Work: not currently due to health issues      Family: brother in [place]      Enjoys: rescue dogs - has 5      Exercise: not  currently due to joint issues   Diet: low calorie, salads      Safety   Seat belts: Yes    Guns: Yes  and secure   Safe in relationships: Yes    Immunization History  Administered Date(s) Administered   Tdap 08/05/2022     Objective: Vital Signs: There were no vitals taken for this visit.   Physical Exam   Musculoskeletal Exam: ***  CDAI Exam: CDAI Score: -- Patient Global: --; Provider Global: -- Swollen: --; Tender: -- Joint Exam 06/29/2023   No joint exam has been documented for this visit   There is currently no information documented on the homunculus. Go to the Rheumatology activity and complete the homunculus joint exam.  Investigation: No additional findings.  Imaging: No results found.  Recent Labs: Lab Results  Component Value Date   WBC 6.6 04/03/2023   HGB 15.0 04/03/2023   PLT 206.0 04/03/2023   NA 142 04/03/2023   K 4.1 04/03/2023   CL 104 04/03/2023   CO2 30 04/03/2023   GLUCOSE 101 (H) 04/03/2023   BUN 14 04/03/2023   CREATININE 0.72 04/03/2023   BILITOT 0.6 04/03/2023   ALKPHOS 64 04/03/2023   AST 17 04/03/2023   ALT 19 04/03/2023   PROT 7.5 04/03/2023   ALBUMIN 4.7 04/03/2023   CALCIUM 9.6 04/03/2023    Speciality Comments: No specialty comments available.  Procedures:  No procedures performed Allergies: Chocolate and Codeine   Assessment / Plan:     Visit Diagnoses: No diagnosis found.  ***  Orders: No orders of the defined types were placed in this encounter.  No orders of the defined types were placed in this encounter.    Follow-Up Instructions: No follow-ups on file.   Glena Landau, RT  Note - This record has been created using [employer].  Chart creation errors have been sought, but may not always  have been located. Such creation errors do not reflect on  the standard of medical care.

## 2023-06-25 ENCOUNTER — Ambulatory Visit

## 2023-06-25 ENCOUNTER — Ambulatory Visit
Admission: RE | Admit: 2023-06-25 | Discharge: 2023-06-25 | Disposition: A | Discharge status: Home / Self Care | Source: Ambulatory Visit | Attending: Family Medicine | Admitting: Family Medicine

## 2023-06-25 DIAGNOSIS — N644 Mastodynia: Secondary | ICD-10-CM

## 2023-06-29 ENCOUNTER — Ambulatory Visit: Admitting: Internal Medicine

## 2023-06-29 DIAGNOSIS — M2550 Pain in unspecified joint: Secondary | ICD-10-CM

## 2023-06-29 DIAGNOSIS — R768 Other specified abnormal immunological findings in serum: Secondary | ICD-10-CM

## 2023-06-29 DIAGNOSIS — M25562 Pain in left knee: Secondary | ICD-10-CM

## 2023-06-29 DIAGNOSIS — M779 Enthesopathy, unspecified: Secondary | ICD-10-CM

## 2023-07-09 ENCOUNTER — Encounter: Payer: Self-pay | Admitting: Internal Medicine

## 2023-07-09 ENCOUNTER — Ambulatory Visit: Attending: Internal Medicine | Admitting: Internal Medicine

## 2023-07-09 VITALS — BP 150/85 | HR 65 | Resp 14 | Ht 60.0 in | Wt 167.0 lb

## 2023-07-09 DIAGNOSIS — Z85020 Personal history of malignant carcinoid tumor of stomach: Secondary | ICD-10-CM

## 2023-07-09 DIAGNOSIS — R768 Other specified abnormal immunological findings in serum: Secondary | ICD-10-CM | POA: Diagnosis not present

## 2023-07-09 DIAGNOSIS — M779 Enthesopathy, unspecified: Secondary | ICD-10-CM | POA: Diagnosis not present

## 2023-07-09 NOTE — Progress Notes (Unsigned)
 Office Visit Note  Patient: Debra Adkins             Date of Birth: 1961/01/04           MRN: [id number]             PCP: Dorothe Gaster, NP Referring: Dorothe Gaster, NP Visit Date: 07/09/2023   Subjective:  No chief complaint on file.   History of Present Illness: Debra Adkins is a 63 y.o. female here for follow up with joint pain and headaches.  ***  Previous HPI 04/21/2023 Debra Adkins is a 63 year old female who presents with joint pain and headaches. She is accompanied by her husband.   She experiences joint pain in multiple areas, which has worsened following recent surgery. She has not initiated any new medications due to concerns about gastrointestinal health, especially after a recent colonoscopy confirmed diverticulitis. She reports significant morning stiffness lasting a couple of hours, difficulty with mobility, and weight gain due to reduced activity.   She underwent a meniscus repair, which took a long time to heal. She reports persistent issues with fluid accumulation in the knee, which was aspirated, and a scaly area that did not heal until she applied clobetasol . The knee remains painful, especially when descending stairs.   She describes experiencing headaches as a new symptom, located across her head and accompanied by a sensation of heat and sweating. She also reports balance issues and short-term memory problems, which her husband has noticed.   She reports chronic diarrhea for the past six months, which has been persistent and problematic.   She has a history of carcinoid tumors and reports flushing episodes, though these feel different from previous episodes. No recent breathing difficulties and she has not used her inhaler for some time.   She describes pain in her heel, which has been present for about two months. The pain is persistent, sometimes throbbing, and is exacerbated by pressure. It is particularly painful at night.       Previous  HPI 10/24/2022 Debra Adkins is a 63 y.o. female here for follow up for chronic joint pain and rashes with positive ANA.  Our initial exam was mostly unremarkable and lab test for systemic inflammation were all normal.  She is back today for follow-up to review labs also due to new onset of joint pain and swelling in her left knee.  This just started after standing up from a chair after a few steps felt a popping sensation in the knee and then the sensation that the joint was going to give way.  Since then has severe pain with pressure and with weightbearing on the medial side of the joint.   Previous HPI 09/30/22 Debra Adkins is a 63 y.o. female here for evaluation of positive ANA associated with joint pains fatigue sensitivity and rashes.  Medical history is also significant for hypothyroidism due to resection for Hashimoto's thyroiditis, gastric surgery for carcinoid tumor, moderate persistent asthma, and chronic venous insufficiency. She notices new and increased symptoms during the past 6 to 12 months.  Did not recall any specific event or medical change.  Main thing in the past few years had been a few episodes of COVID though none with severe complications.  She did sustain a fall landing on her left elbow in 2022 with associated left arm pain.  She had x-rays of the low back and the elbow last year with mild degenerative changes but not particularly significant compared to symptoms.  Also had ultrasound exam ruling out upper extremity DVT due to persistent swelling.  Subsequent elbow MRI consistent with tendinosis or partial thickness tear with associated edema.  Besides this her main joint pain and she has been in the back and at the hips.  This is limiting her walking ability previously was very active walking 5 dogs now cannot tolerate a lot due to pain in her hip also with pain and numbness in her toes.  She feels the left hip gets sharp pain anteriorly that comes and goes.  Also gets lateral hip  pain bothers him trying to sleep.  Pain throughout her back from upper to lower.  She is also been experiencing a sensitivity to light touch and frequent burning painful sensation in her skin mostly in her torso and proximal upper body.  She was prescribed Flexeril  for muscle spasms in June not currently on any other prescription medication for musculoskeletal or nerve pain. Evaluation in primary care office in April showed a high TSH and low vitamin D  these were corrected by follow-up in July.  Also takes B12 supplementation. Has noticed skin rash with redness at the front of the neck and sometimes across the bridge of the nose and central face.  Most often sees this after waking up individual episodes lasting minutes or up to a few hours.  Does not particularly associated with sun exposure. She notices hair and fingernail thinning and brittleness.  Has dry eyes and mouth 1 recent sore on the inside of right cheek. Fingers and toes often cold or numb without visible discoloration. Sleep quality is very poor describes several hours trying to fall asleep each night despite feeling excessively tired.  Has never had a sleep study has been told that she snores this was apparently worse when she weighed more. She does report short term memory problems and poor concentration. She has diarrhea chronically. Some episodes of dizziness not having headaches.   Labs reviewed ANA 1:80 homogenous RF neg CCP neg MCV neg   No Rheumatology ROS completed.   PMFS History:  Patient Active Problem List   Diagnosis Date Noted   Enthesitis 04/21/2023   Left lower quadrant abdominal pain 04/03/2023   Pain in left knee 10/24/2022   Other insomnia 10/17/2022   Positive ANA (antinuclear antibody) 09/30/2022   Fall 08/05/2022   Skin lesion 08/05/2022   Obesity (BMI 30-39.9) 08/05/2022   Vitamin D  deficiency 08/05/2022   Depression, major, single episode, mild (HCC) 05/19/2022   Multiple joint pain 05/19/2022    Lateral epicondylitis, left elbow 08/22/2021   Chronic venous insufficiency 08/02/2021   Left arm pain 06/05/2021   Lumbar pain 05/15/2021   Left elbow pain 05/15/2021   Preventative health care 05/15/2021   Varicose veins of both lower extremities with pain 05/15/2021   Hiatal hernia 05/02/2020   History of malignant carcinoid tumor of stomach 05/02/2020   B12 deficiency 10/20/2019   History of gastric surgery 10/20/2019   Hypothyroidism due to Hashimoto's thyroiditis 10/20/2019   Moderate persistent asthma without complication 10/20/2019   COVID-19 03/17/2019    Past Medical History:  Diagnosis Date   Allergy    Asthma    Blood transfusion without reported diagnosis    Carcinoid tumor of colon    Followed by Dr. Anil Tumbarpurra in [place]   Chicken pox    Constipation    Diverticulitis    Diverticulosis    Hypertension    Thyroid  disease    hoshimotos's thyroiditis at age 89  Family History  Problem Relation Age of Onset   Heart disease Father    Hyperlipidemia Father    Other Father        Heart transplant   Diabetes Brother    Heart disease Brother    Testicular cancer Brother    Heart disease Brother    Heart attack Brother    Past Surgical History:  Procedure Laterality Date   ABDOMINAL HYSTERECTOMY     states took everything but cervix?   DG GALL BLADDER     LAPAROSCOPIC GASTRIC SLEEVE RESECTION     TENNIS ELBOW RELEASE/NIRSCHEL PROCEDURE Left 01/24/2022   Procedure: LEFT LATERAL EPICONDYLE DEBRIDEMENT, REPAIR AS INIDICATED;  Surgeon: Wes Hamman, MD;  Location: [facility];  Service: Orthopedics;  Laterality: Left;   THYROIDECTOMY     Social History   Social History Narrative   05/02/20   From: [place] , but moved when she was high school   Living: with [place], husband (2019) but together since 2005   Work: not currently due to health issues      Family: brother in [place]      Enjoys: rescue dogs - has 5      Exercise: not  currently due to joint issues   Diet: low calorie, salads      Safety   Seat belts: Yes    Guns: Yes  and secure   Safe in relationships: Yes    Immunization History  Administered Date(s) Administered   Tdap 08/05/2022     Objective: Vital Signs: There were no vitals taken for this visit.   Physical Exam   Musculoskeletal Exam: ***  CDAI Exam: CDAI Score: -- Patient Global: --; Provider Global: -- Swollen: --; Tender: -- Joint Exam 07/09/2023   No joint exam has been documented for this visit   There is currently no information documented on the homunculus. Go to the Rheumatology activity and complete the homunculus joint exam.  Investigation: No additional findings.  Imaging: MM 3D DIAGNOSTIC MAMMOGRAM BILATERAL BREAST Result Date: 06/25/2023 CLINICAL DATA:  63 year old who recently had nonfocal pain in the outer portions both breasts, the patient states the pain has resolved. Annual evaluation. EXAM: DIGITAL DIAGNOSTIC BILATERAL MAMMOGRAM WITH TOMOSYNTHESIS AND CAD TECHNIQUE: Bilateral digital diagnostic mammography and breast tomosynthesis was performed. The images were evaluated with computer-aided detection. COMPARISON:  Previous exam(s). ACR Breast Density Category b: There are scattered areas of fibroglandular density. FINDINGS: Full field CC and MLO views of both breasts were obtained. RIGHT: No findings suspicious malignancy. LEFT: No findings suspicious for malignancy. IMPRESSION: No mammographic evidence of malignancy involving either breast. RECOMMENDATION: Screening mammogram in one year.(Code:[id number]-B-01Y) I have discussed the findings and recommendations with the patient. I also discussed with the patient the fact that breast pain is a common condition, and rarely a sign of breast cancer. It can be exacerbated by hormonal changes, hormonal medications, caffeine, and weight changes. It can be improved by wearing adequate support, over-the-counter pain medication, low-fat  diet, exercise, and ice as needed. Studies have shown an improvement in cyclic pain with use of evening primrose oil and vitamin E. Topical medications containing arnica, diclofenac or lidocaine  may provide temporary pain relief. Often breast pain will resolve on its own without intervention. If applicable, a reminder letter will be sent to the patient regarding the next appointment. BI-RADS CATEGORY  1: Negative. Electronically Signed   By: Rinda Cheers M.D.   On: 06/25/2023 08:33    Recent Labs: Lab Results  Component Value Date   WBC 6.6 04/03/2023   HGB 15.0 04/03/2023   PLT 206.0 04/03/2023   NA 142 04/03/2023   K 4.1 04/03/2023   CL 104 04/03/2023   CO2 30 04/03/2023   GLUCOSE 101 (H) 04/03/2023   BUN 14 04/03/2023   CREATININE 0.72 04/03/2023   BILITOT 0.6 04/03/2023   ALKPHOS 64 04/03/2023   AST 17 04/03/2023   ALT 19 04/03/2023   PROT 7.5 04/03/2023   ALBUMIN 4.7 04/03/2023   CALCIUM 9.6 04/03/2023    Speciality Comments: No specialty comments available.  Procedures:  No procedures performed Allergies: Chocolate and Codeine   Assessment / Plan:     Visit Diagnoses: No diagnosis found.  ***  Orders: No orders of the defined types were placed in this encounter.  No orders of the defined types were placed in this encounter.    Follow-Up Instructions: No follow-ups on file.   Matt Song, MD  Note - This record has been created using [employer].  Chart creation errors have been sought, but may not always  have been located. Such creation errors do not reflect on  the standard of medical care.

## 2023-07-14 ENCOUNTER — Other Ambulatory Visit: Payer: Self-pay

## 2023-07-14 MED ORDER — HYDROXYCHLOROQUINE SULFATE 200 MG PO TABS: 200.0000 mg | ORAL_TABLET | Freq: Every day | ORAL | 2 refills | Status: DC

## 2023-07-14 NOTE — Telephone Encounter (Signed)
 Patient contacted the office to see if the prescription for the Hydroxychlorquine had been sent as it was discussed at the last visit. I have pended the medication. Please advise.

## 2023-08-10 ENCOUNTER — Encounter: Payer: Self-pay | Admitting: Internal Medicine

## 2023-08-10 ENCOUNTER — Encounter: Payer: Self-pay | Admitting: Nurse Practitioner

## 2023-08-10 MED ORDER — SYRINGE/NEEDLE (DISP) 25G X 1" 1 ML MISC: 1 refills | Status: AC

## 2023-08-10 NOTE — Telephone Encounter (Signed)
 Noted in blue box in patient's chart.

## 2023-08-21 ENCOUNTER — Ambulatory Visit: Admitting: Podiatry

## 2023-08-21 DIAGNOSIS — M7661 Achilles tendinitis, right leg: Secondary | ICD-10-CM

## 2023-08-21 NOTE — Progress Notes (Signed)
 Subjective:  Patient ID: Debra Adkins, female    DOB: 12/06/1960,  MRN: [id number]  Chief Complaint  Patient presents with   Foot Pain    Right foot heel pain left foot 2nd toe pt stated that it is numb and painful     63 y.o. female presents with the above complaint.  Patient presents with right Achilles tendinitis insertional pain hurts with ambulation or shoe pressure she has not seen MRIs prior to seeing me denies any other acute complaints pain scale 7 out of 10 dull aching nature would like to discuss treatment options for it.   Review of Systems: Negative except as noted in the HPI. Denies N/V/F/Ch.  Past Medical History:  Diagnosis Date   Allergy    Asthma    Blood transfusion without reported diagnosis    Carcinoid tumor of colon    Followed by Dr. Anil Tumbarpurra in [place]   Chicken pox    Constipation    Diverticulitis    Diverticulosis    Hypertension    Thyroid  disease    hoshimotos's thyroiditis at age 2    Current Outpatient Medications:    albuterol  (VENTOLIN  HFA) 108 (90 Base) MCG/ACT inhaler, Inhale 2 puffs into the lungs every 6 (six) hours as needed for wheezing or shortness of breath., Disp: 18 each, Rfl: 1   cholestyramine light (PREVALITE) 4 g packet, Take by mouth. (Patient not taking: Reported on 07/09/2023), Disp: , Rfl:    clobetasol  cream (TEMOVATE ) 0.05 %, Apply 1 Application topically 2 (two) times daily., Disp: 30 g, Rfl: 0   cyanocobalamin  (VITAMIN B12) 1000 MCG/ML injection, INJECT 1 ML (1,000 MCG TOTAL) INTO THE SKIN EVERY 7 (SEVEN) DAYS., Disp: 12 mL, Rfl: 3   cyclobenzaprine  (FLEXERIL ) 5 MG tablet, TAKE 1 TABLET BY MOUTH 2 TIMES DAILY AS NEEDED FOR UP TO 15 DAYS FOR MUSCLE SPASMS, Disp: 30 tablet, Rfl: 1   hydroxychloroquine  (PLAQUENIL ) 200 MG tablet, Take 1 tablet (200 mg total) by mouth daily., Disp: 30 tablet, Rfl: 2   levothyroxine  (SYNTHROID ) 125 MCG tablet, TAKE 1 TAB EVERY MORNING ON EMPTY STOMACH W/ WATER ONLY. NO FOOD OR OTHER  MEDICATIONS FOR 30 MINUTES, Disp: 90 tablet, Rfl: 2   OXYCODONE  HCL PO, Take by mouth. As needed for pain (Patient not taking: Reported on 07/09/2023), Disp: , Rfl:    Pediatric Multiple Vitamins (FLINTSTONES MULTIVITAMIN PO), Take by mouth., Disp: , Rfl:    promethazine  (PHENERGAN ) 25 MG tablet, Take 12.5-25 mg by mouth 3 (three) times daily as needed., Disp: , Rfl:    sertraline  (ZOLOFT ) 50 MG tablet, TAKE 1 TABLET BY MOUTH EVERY DAY, Disp: 90 tablet, Rfl: 1   Syringe/Needle, Disp, 25G X 1 1 ML MISC, Use one syringe to administer B12 injection, Disp: 25 each, Rfl: 1   Vitamin D , Ergocalciferol , (DRISDOL ) 1.25 MG (50000 UNIT) CAPS capsule, Take 1 capsule (50,000 Units total) by mouth every 7 (seven) days., Disp: 12 capsule, Rfl: 0  Social History   Tobacco Use  Smoking Status Former   Current packs/day: 0.00   Average packs/day: 0.3 packs/day for 20.0 years (5.0 ttl pk-yrs)   Types: Cigarettes   Start date: 01/20/1985   Quit date: 01/20/2005   Years since quitting: 18.6   Passive exposure: Past  Smokeless Tobacco Never    Allergies  Allergen Reactions   Chocolate Hives and Itching   Codeine Rash    nausea   Objective:  There were no vitals filed for this visit. There is  no height or weight on file to calculate BMI. Constitutional Well developed. Well nourished.  Vascular Dorsalis pedis pulses palpable bilaterally. Posterior tibial pulses palpable bilaterally. Capillary refill normal to all digits.  No cyanosis or clubbing noted. Pedal hair growth normal.  Neurologic Normal speech. Oriented to person, place, and time. Epicritic sensation to light touch grossly present bilaterally.  Dermatologic Nails well groomed and normal in appearance. No open wounds. No skin lesions.  Orthopedic: Pain on palpation right Achilles tendon insertional positive Haglund's deformity noted positive Silfverskiold test noted with gastrocnemius equinus.  No pain at the peroneal tendon posterior  tibial tendon ATFL ligament   Radiographs: None Assessment:   1. Achilles tendinitis, right leg    Plan:  Patient was evaluated and treated and all questions answered.  Right Achilles tendinitis - All questions or concerns were discussed with the patient in extensive detail given the amount of pain that she is experiencing she will benefit from cam boot immobilization I discussed this to wear this for next 4 weeks she states understanding if any foot and ankle issues on future she will come back and see me. - If there is no resolve meant we discussed MRI versus steroid injection during next visit  No follow-ups on file.

## 2023-09-14 NOTE — Progress Notes (Deleted)
 Office Visit Note  Patient: Debra Adkins             Date of Birth: 01-27-1960           MRN: [id number]             PCP: Wendee Lynwood HERO, NP Referring: Wendee Lynwood HERO, NP Visit Date: 09/28/2023   Subjective:  No chief complaint on file.   History of Present Illness: Debra Adkins is a 63 y.o. female here for follow up  with joint pain and headaches, abdominal pain, and diarrhea.    Previous HPI 07/09/2023 Debra Adkins is a 63 y.o. female here for follow up with joint pain and headaches, abdominal pain, and diarrhea. She had interval evaluation with EGD and colonoscopy with [facility] with findings for  carcinoid tumor and gastritis who presents with ongoing gastrointestinal symptoms and joint pain.   She experiences persistent gastrointestinal issues, including lower abdominal pain. An endoscopy revealed gastritis-related changes. She is scheduled for a 24-hour urine test to evaluate carcinoid tumor activity. Flushing symptoms are present, and steroid treatment provided minimal relief for abdominal pain but caused sleep disturbances. She uses sleep aids reluctantly.   She reports widespread bruising and swelling, particularly around the site of her previous thyroidectomy, describing the swelling as tender. Fatigue and lack of energy are noted, along with facial flushing. She experiences frequent sweating, even in cool environments, and a sensation of her legs falling asleep with associated pain.   Joint pain is persistent and impacts daily life. A history of a positive ANA test is noted. Swelling is present in her fingers and toes, but without discoloration.     Previous HPI 04/21/2023 Debra Adkins is a 63 year old female who presents with joint pain and headaches. She is accompanied by her husband.   She experiences joint pain in multiple areas, which has worsened following recent surgery. She has not initiated any new medications due to concerns about gastrointestinal health,  especially after a recent colonoscopy confirmed diverticulitis. She reports significant morning stiffness lasting a couple of hours, difficulty with mobility, and weight gain due to reduced activity.   She underwent a meniscus repair, which took a long time to heal. She reports persistent issues with fluid accumulation in the knee, which was aspirated, and a scaly area that did not heal until she applied clobetasol . The knee remains painful, especially when descending stairs.   She describes experiencing headaches as a new symptom, located across her head and accompanied by a sensation of heat and sweating. She also reports balance issues and short-term memory problems, which her husband has noticed.   She reports chronic diarrhea for the past six months, which has been persistent and problematic.   She has a history of carcinoid tumors and reports flushing episodes, though these feel different from previous episodes. No recent breathing difficulties and she has not used her inhaler for some time.   She describes pain in her heel, which has been present for about two months. The pain is persistent, sometimes throbbing, and is exacerbated by pressure. It is particularly painful at night.       Previous HPI 10/24/2022 Debra Adkins is a 63 y.o. female here for follow up for chronic joint pain and rashes with positive ANA.  Our initial exam was mostly unremarkable and lab test for systemic inflammation were all normal.  She is back today for follow-up to review labs also due to new onset of joint pain and swelling  in her left knee.  This just started after standing up from a chair after a few steps felt a popping sensation in the knee and then the sensation that the joint was going to give way.  Since then has severe pain with pressure and with weightbearing on the medial side of the joint.   Previous HPI 09/30/22 Debra Adkins is a 63 y.o. female here for evaluation of positive ANA associated with  joint pains fatigue sensitivity and rashes.  Medical history is also significant for hypothyroidism due to resection for Hashimoto's thyroiditis, gastric surgery for carcinoid tumor, moderate persistent asthma, and chronic venous insufficiency. She notices new and increased symptoms during the past 6 to 12 months.  Did not recall any specific event or medical change.  Main thing in the past few years had been a few episodes of COVID though none with severe complications.  She did sustain a fall landing on her left elbow in 2022 with associated left arm pain.  She had x-rays of the low back and the elbow last year with mild degenerative changes but not particularly significant compared to symptoms.  Also had ultrasound exam ruling out upper extremity DVT due to persistent swelling.  Subsequent elbow MRI consistent with tendinosis or partial thickness tear with associated edema.  Besides this her main joint pain and she has been in the back and at the hips.  This is limiting her walking ability previously was very active walking 5 dogs now cannot tolerate a lot due to pain in her hip also with pain and numbness in her toes.  She feels the left hip gets sharp pain anteriorly that comes and goes.  Also gets lateral hip pain bothers him trying to sleep.  Pain throughout her back from upper to lower.  She is also been experiencing a sensitivity to light touch and frequent burning painful sensation in her skin mostly in her torso and proximal upper body.  She was prescribed Flexeril  for muscle spasms in June not currently on any other prescription medication for musculoskeletal or nerve pain. Evaluation in primary care office in April showed a high TSH and low vitamin D  these were corrected by follow-up in July.  Also takes B12 supplementation. Has noticed skin rash with redness at the front of the neck and sometimes across the bridge of the nose and central face.  Most often sees this after waking up individual  episodes lasting minutes or up to a few hours.  Does not particularly associated with sun exposure. She notices hair and fingernail thinning and brittleness.  Has dry eyes and mouth 1 recent sore on the inside of right cheek. Fingers and toes often cold or numb without visible discoloration. Sleep quality is very poor describes several hours trying to fall asleep each night despite feeling excessively tired.  Has never had a sleep study has been told that she snores this was apparently worse when she weighed more. She does report short term memory problems and poor concentration. She has diarrhea chronically. Some episodes of dizziness not having headaches.   Labs reviewed ANA 1:80 homogenous RF neg CCP neg MCV neg  No Rheumatology ROS completed.   PMFS History:  Patient Active Problem List   Diagnosis Date Noted   Enthesitis 04/21/2023   Left lower quadrant abdominal pain 04/03/2023   Pain in left knee 10/24/2022   Other insomnia 10/17/2022   Positive ANA (antinuclear antibody) 09/30/2022   Fall 08/05/2022   Skin lesion 08/05/2022   Obesity (  BMI 30-39.9) 08/05/2022   Vitamin D  deficiency 08/05/2022   Depression, major, single episode, mild (HCC) 05/19/2022   Multiple joint pain 05/19/2022   Lateral epicondylitis, left elbow 08/22/2021   Chronic venous insufficiency 08/02/2021   Left arm pain 06/05/2021   Lumbar pain 05/15/2021   Left elbow pain 05/15/2021   Preventative health care 05/15/2021   Varicose veins of both lower extremities with pain 05/15/2021   Hiatal hernia 05/02/2020   History of malignant carcinoid tumor of stomach 05/02/2020   B12 deficiency 10/20/2019   History of gastric surgery 10/20/2019   Hypothyroidism due to Hashimoto's thyroiditis 10/20/2019   Moderate persistent asthma without complication 10/20/2019   COVID-19 03/17/2019    Past Medical History:  Diagnosis Date   Allergy    Asthma    Blood transfusion without reported diagnosis    Carcinoid  tumor of colon    Followed by Dr. Anil Tumbarpurra in [place]   Chicken pox    Constipation    Diverticulitis    Diverticulosis    Hypertension    Thyroid  disease    hoshimotos's thyroiditis at age 70    Family History  Problem Relation Age of Onset   Heart disease Father    Hyperlipidemia Father    Other Father        Heart transplant   Diabetes Brother    Heart disease Brother    Testicular cancer Brother    Heart disease Brother    Heart attack Brother    Past Surgical History:  Procedure Laterality Date   ABDOMINAL HYSTERECTOMY     states took everything but cervix?   DG GALL BLADDER     KNEE SURGERY Left 2025   LAPAROSCOPIC GASTRIC SLEEVE RESECTION     TENNIS ELBOW RELEASE/NIRSCHEL PROCEDURE Left 01/24/2022   Procedure: LEFT LATERAL EPICONDYLE DEBRIDEMENT, REPAIR AS INIDICATED;  Surgeon: Jerri Kay HERO, MD;  Location: [facility];  Service: Orthopedics;  Laterality: Left;   THYROIDECTOMY     Social History   Social History Narrative   05/02/20   From: [place] , but moved when she was high school   Living: with Eloy, husband (2019) but together since 2005   Work: not currently due to health issues      Family: brother in [place]      Enjoys: rescue dogs - has 5      Exercise: not currently due to joint issues   Diet: low calorie, salads      Safety   Seat belts: Yes    Guns: Yes  and secure   Safe in relationships: Yes    Immunization History  Administered Date(s) Administered   Tdap 08/05/2022     Objective: Vital Signs: There were no vitals taken for this visit.   Physical Exam   Musculoskeletal Exam: ***  CDAI Exam: CDAI Score: -- Patient Global: --; Provider Global: -- Swollen: --; Tender: -- Joint Exam 09/28/2023   No joint exam has been documented for this visit   There is currently no information documented on the homunculus. Go to the Rheumatology activity and complete the homunculus joint exam.  Investigation: No  additional findings.  Imaging: No results found.  Recent Labs: Lab Results  Component Value Date   WBC 6.6 04/03/2023   HGB 15.0 04/03/2023   PLT 206.0 04/03/2023   NA 142 04/03/2023   K 4.1 04/03/2023   CL 104 04/03/2023   CO2 30 04/03/2023   GLUCOSE 101 (H) 04/03/2023   BUN  14 04/03/2023   CREATININE 0.72 04/03/2023   BILITOT 0.6 04/03/2023   ALKPHOS 64 04/03/2023   AST 17 04/03/2023   ALT 19 04/03/2023   PROT 7.5 04/03/2023   ALBUMIN 4.7 04/03/2023   CALCIUM 9.6 04/03/2023    Speciality Comments: Patient would like 90 day refills when able, instead of 30 with 2 refills  Procedures:  No procedures performed Allergies: Chocolate and Codeine   Assessment / Plan:     Visit Diagnoses: No diagnosis found.  ***  Orders: No orders of the defined types were placed in this encounter.  No orders of the defined types were placed in this encounter.    Follow-Up Instructions: No follow-ups on file.   Shaquinta Peruski M Brasen Bundren, CMA  Note - This record has been created using [employer].  Chart creation errors have been sought, but may not always  have been located. Such creation errors do not reflect on  the standard of medical care.

## 2023-09-16 ENCOUNTER — Ambulatory Visit: Admitting: Podiatry

## 2023-09-24 ENCOUNTER — Other Ambulatory Visit: Payer: Self-pay

## 2023-09-24 ENCOUNTER — Emergency Department (HOSPITAL_BASED_OUTPATIENT_CLINIC_OR_DEPARTMENT_OTHER)
Admission: EM | Admit: 2023-09-24 | Discharge: 2023-09-24 | Disposition: A | Discharge status: Home / Self Care | Attending: Emergency Medicine | Admitting: Emergency Medicine

## 2023-09-24 ENCOUNTER — Emergency Department (HOSPITAL_BASED_OUTPATIENT_CLINIC_OR_DEPARTMENT_OTHER): Admitting: Radiology

## 2023-09-24 ENCOUNTER — Telehealth: Payer: Self-pay

## 2023-09-24 ENCOUNTER — Emergency Department (HOSPITAL_BASED_OUTPATIENT_CLINIC_OR_DEPARTMENT_OTHER)

## 2023-09-24 DIAGNOSIS — R519 Headache, unspecified: Secondary | ICD-10-CM | POA: Insufficient documentation

## 2023-09-24 DIAGNOSIS — R072 Precordial pain: Secondary | ICD-10-CM | POA: Insufficient documentation

## 2023-09-24 DIAGNOSIS — M542 Cervicalgia: Secondary | ICD-10-CM | POA: Insufficient documentation

## 2023-09-24 DIAGNOSIS — Z86012 Personal history of benign carcinoid tumor: Secondary | ICD-10-CM | POA: Insufficient documentation

## 2023-09-24 DIAGNOSIS — R070 Pain in throat: Secondary | ICD-10-CM | POA: Diagnosis not present

## 2023-09-24 DIAGNOSIS — R079 Chest pain, unspecified: Secondary | ICD-10-CM | POA: Diagnosis present

## 2023-09-24 LAB — CBC
HCT: 44.4 % (ref 36.0–46.0)
Hemoglobin: 15.1 g/dL — ABNORMAL HIGH (ref 12.0–15.0)
MCH: 31.1 pg (ref 26.0–34.0)
MCHC: 34 g/dL (ref 30.0–36.0)
MCV: 91.4 fL (ref 80.0–100.0)
Platelets: 212 K/uL (ref 150–400)
RBC: 4.86 MIL/uL (ref 3.87–5.11)
RDW: 12.9 % (ref 11.5–15.5)
WBC: 8.3 K/uL (ref 4.0–10.5)
nRBC: 0 % (ref 0.0–0.2)

## 2023-09-24 LAB — BASIC METABOLIC PANEL WITH GFR
Anion gap: 14 (ref 5–15)
BUN: 12 mg/dL (ref 8–23)
CO2: 24 mmol/L (ref 22–32)
Calcium: 10.6 mg/dL — ABNORMAL HIGH (ref 8.9–10.3)
Chloride: 102 mmol/L (ref 98–111)
Creatinine, Ser: 0.73 mg/dL (ref 0.44–1.00)
GFR, Estimated: >60 mL/min (ref >60)
Glucose, Bld: 94 mg/dL (ref 70–99)
Potassium: 3.7 mmol/L (ref 3.5–5.1)
Sodium: 141 mmol/L (ref 135–145)

## 2023-09-24 LAB — TROPONIN T, HIGH SENSITIVITY: Troponin T High Sensitivity: <15 ng/L (ref 0–19)

## 2023-09-24 NOTE — ED Triage Notes (Signed)
 Patient states bilateral jaw pain for the past several weeks that has progressively gotten worse. Also reports an increase in sweating and dizziness over the same time period.

## 2023-09-24 NOTE — ED Notes (Signed)
 ED Provider at bedside.

## 2023-09-24 NOTE — Discharge Instructions (Addendum)
 Please read and follow all provided instructions.  Your diagnoses today include:  1. Precordial pain   2. Acute nonintractable headache, unspecified headache type     Tests performed today include: An EKG of your heart: Shows nonspecific T wave inversions, please have this monitored by your doctor A chest x-ray Cardiac enzymes - a blood test for heart muscle damage, was negative and does not show any signs of stress in the heart today Blood counts and electrolytes CT of the head was normal without any concerning causes of headache Vital signs. See below for your results today.   Medications prescribed:  None  Take any prescribed medications only as directed.  Follow-up instructions: Please follow-up with your primary care provider as soon as you can for further evaluation of your symptoms.   Return instructions:  SEEK IMMEDIATE MEDICAL ATTENTION IF: You have severe chest pain, especially if the pain is crushing or pressure-like and spreads to the arms, back, neck, or jaw, or if you have sweating, nausea or vomiting, or trouble with breathing. THIS IS AN EMERGENCY. Do not wait to see if the pain will go away. Get medical help at once. Call 911. DO NOT drive yourself to the hospital.  Your chest pain gets worse and does not go away after a few minutes of rest.  You have an attack of chest pain lasting longer than what you usually experience.  You have significant dizziness, if you pass out, or have trouble walking.  You have chest pain not typical of your usual pain for which you originally saw your caregiver.  Return if you have weakness in your arms or legs, slurred speech, trouble walking or talking, confusion, or trouble with your balance.  You have any other emergent concerns regarding your health.  Additional Information: Chest pain comes from many different causes. Your caregiver has diagnosed you as having chest pain that is not specific for one problem, but does not require  admission.  You are at low risk for an acute heart condition or other serious illness.   Your vital signs today were: BP (!) 163/96 (BP Location: Right Arm)   Pulse 90   Temp 98.2 F (36.8 C) (Oral)   Resp 17   SpO2 94%  If your blood pressure (BP) was elevated above 135/85 this visit, please have this repeated by your doctor within one month. --------------

## 2023-09-24 NOTE — ED Provider Notes (Signed)
 [place] EMERGENCY DEPARTMENT AT [facility] Provider Note   CSN: [id number] Arrival date & time: 09/24/23  1431     Patient presents with: Jaw Pain   Debra Adkins is a 63 y.o. female.   Patient with history of carcinoid tumor/carcinoid syndrome, currently being worked up for lupus, patient reports history of abnormal EKG --presents to the emergency department today for evaluation of chest and neck pain.  Patient reports having a couple of intermittent episodes of intense upper chest, midline, lower throat pain that then radiates up into the throat and feels like a tightness.  Symptoms last for several minutes.  These are associated with episodes of sweating intensely.  No vomiting.  She also reports a bilateral mild headache.  Symptoms occurred at home about 1 to 2 weeks ago.  She had another episode yesterday while eating lunch with a friend.  She states that she called her primary care doctor and scheduled an appointment for tomorrow.  She states that they called her back and asked her to go in for evaluation today.  Patient denies history of heart disease.  She is currently on hydroxychloroquine  for aches and pains.  Carcinoid tumor currently controlled.  She reports history of menopause, no recurrent hot flashes.  Symptoms seem different than her usual symptoms with carcinoid.       Prior to Admission medications   Medication Sig Start Date End Date Taking? Authorizing Provider  albuterol  (VENTOLIN  HFA) 108 (90 Base) MCG/ACT inhaler Inhale 2 puffs into the lungs every 6 (six) hours as needed for wheezing or shortness of breath. 03/20/22   Piontek, Rocky, MD  cholestyramine light (PREVALITE) 4 g packet Take by mouth. Patient not taking: Reported on 07/09/2023 07/01/23   [provider]  clobetasol  cream (TEMOVATE ) 0.05 % Apply 1 Application topically 2 (two) times daily. 08/05/22   Wendee Lynwood HERO, NP  cyanocobalamin  (VITAMIN B12) 1000 MCG/ML injection INJECT 1 ML (1,000  MCG TOTAL) INTO THE SKIN EVERY 7 (SEVEN) DAYS. 08/14/22   Wendee Lynwood HERO, NP  cyclobenzaprine  (FLEXERIL ) 5 MG tablet TAKE 1 TABLET BY MOUTH 2 TIMES DAILY AS NEEDED FOR UP TO 15 DAYS FOR MUSCLE SPASMS 07/11/22   Wendee Lynwood HERO, NP  hydroxychloroquine  (PLAQUENIL ) 200 MG tablet Take 1 tablet (200 mg total) by mouth daily. 07/14/23   Rice, Lonni ORN, MD  levothyroxine  (SYNTHROID ) 125 MCG tablet TAKE 1 TAB EVERY MORNING ON EMPTY STOMACH W/ WATER ONLY. NO FOOD OR OTHER MEDICATIONS FOR 30 MINUTES 05/01/23   Wendee Lynwood HERO, NP  Pediatric Multiple Vitamins (FLINTSTONES MULTIVITAMIN PO) Take by mouth.    [provider]  promethazine  (PHENERGAN ) 25 MG tablet Take 12.5-25 mg by mouth 3 (three) times daily as needed. 04/16/20   [provider]  sertraline  (ZOLOFT ) 50 MG tablet TAKE 1 TABLET BY MOUTH EVERY DAY 05/01/23   Wendee Lynwood HERO, NP  Syringe/Needle, Disp, 25G X 1 1 ML MISC Use one syringe to administer B12 injection 08/10/23   Wendee Lynwood HERO, NP  Vitamin D , Ergocalciferol , (DRISDOL ) 1.25 MG (50000 UNIT) CAPS capsule Take 1 capsule (50,000 Units total) by mouth every 7 (seven) days. 07/28/22   Wendee Lynwood HERO, NP    Allergies: Chocolate and Codeine    Review of Systems  Updated Vital Signs BP (!) 163/96 (BP Location: Right Arm)   Pulse 90   Temp 98.2 F (36.8 C) (Oral)   Resp 17   SpO2 94%   Physical Exam Vitals and nursing note reviewed.  Constitutional:      Appearance: She is well-developed. She is not diaphoretic.  HENT:     Head: Normocephalic and atraumatic.     Nose: No congestion.     Mouth/Throat:     Mouth: Mucous membranes are moist. Mucous membranes are not dry.     Comments: Oropharynx is clear Eyes:     Conjunctiva/sclera: Conjunctivae normal.  Neck:     Vascular: Normal carotid pulses. No JVD.     Trachea: Trachea normal. No tracheal deviation.  Cardiovascular:     Rate and Rhythm: Normal rate and regular rhythm.     Pulses: No decreased pulses.           Radial pulses are 2+ on the right side and 2+ on the left side.     Heart sounds: Normal heart sounds, S1 normal and S2 normal. No murmur heard. Pulmonary:     Effort: Pulmonary effort is normal. No respiratory distress.     Breath sounds: No wheezing.  Chest:     Chest wall: No tenderness.  Abdominal:     General: Bowel sounds are normal.     Palpations: Abdomen is soft.     Tenderness: There is no abdominal tenderness. There is no guarding or rebound.  Musculoskeletal:        General: Normal range of motion.     Cervical back: Normal range of motion and neck supple. No muscular tenderness.  Skin:    General: Skin is warm and dry.     Coloration: Skin is not pale.  Neurological:     General: No focal deficit present.     Mental Status: She is alert. Mental status is at baseline.     Motor: No weakness.     Coordination: Coordination normal.     (all labs ordered are listed, but only abnormal results are displayed) Labs Reviewed  BASIC METABOLIC PANEL WITH GFR - Abnormal; Notable for the following components:      Result Value   Calcium 10.6 (*)    All other components within normal limits  CBC - Abnormal; Notable for the following components:   Hemoglobin 15.1 (*)    All other components within normal limits  TROPONIN T, HIGH SENSITIVITY    EKG: EKG Interpretation Date/Time:  Thursday September 24 2023 14:57:17 EDT Ventricular Rate:  91 PR Interval:  150 QRS Duration:  93 QT Interval:  328 QTC Calculation: 404 R Axis:   91  Text Interpretation: Sinus rhythm Right axis deviation Nonspecific repol abnormality, diffuse leads No previous ECGs available Confirmed by Zackowski, Scott [phone]) on 09/24/2023 3:17:00 PM  Radiology: [facility] Chest 2 View Result Date: 09/24/2023 CLINICAL DATA:  Chest pain. EXAM: CHEST - 2 VIEW COMPARISON:  March 20, 2022. FINDINGS: The heart size and mediastinal contours are within normal limits. Both lungs are clear. The visualized skeletal  structures are unremarkable. IMPRESSION: No active cardiopulmonary disease. Electronically Signed   By: Lynwood Landy Raddle M.D.   On: 09/24/2023 15:34     Procedures   Medications Ordered in the ED - No data to display  ED Course  Patient seen and examined. History obtained directly from patient. Work-up including labs, imaging, EKG ordered in triage, if performed, were reviewed.    Labs/EKG: Independently reviewed and interpreted.  This included: CBC with normal white blood cell count, mildly elevated hemoglobin at 15.1; BMP calcium slightly high at 10.6 otherwise unremarkable; less than 15.  Last occurrence of symptoms was yesterday.  Do  not suspect ACS at this time.  EKG is abnormal, reviewed as above, unfortunately no old for comparison.  Patient does report history of abnormal EKG.  Imaging: Independently visualized and interpreted.  This included: Chest x-ray, agree negative.  Ordered head CT given new onset of headache.  Medications/Fluids: None ordered  Most recent vital signs reviewed and are as follows: BP (!) 163/96 (BP Location: Right Arm)   Pulse 90   Temp 98.2 F (36.8 C) (Oral)   Resp 17   SpO2 94%   Initial impression: Atypical chest pain and sweating in setting of carcinoid.  Cardiac evaluation with abnormal EKG but normal troponin.  Chest x-ray clear.  Reassuring neuroexam but given new headache will obtain head CT.  6:42 PM Reassessment performed. Patient appears stable, comfortable.  No recurrent symptoms while in the emergency department.  Imaging personally visualized and interpreted including: CT head, agree negative.  Reviewed pertinent lab work and imaging with patient at bedside. Questions answered.   Most current vital signs reviewed and are as follows: BP (!) 163/96 (BP Location: Right Arm)   Pulse 90   Temp 98.2 F (36.8 C) (Oral)   Resp 17   SpO2 94%   Plan: Discharge to home.   Prescriptions written for: None  Return and follow-up  instructions: I encouraged patient to return to ED with severe chest pain, especially if the pain is crushing or pressure-like and spreads to the arms, back, neck, or jaw, or if they have associated sweating, vomiting, or shortness of breath with the pain, or significant pain with activity. We discussed that the evaluation here today indicates a low-risk of serious cause of chest pain, including heart trouble or a blood clot, but no evaluation is perfect and chest pain can evolve with time. The patient verbalized understanding and agreed.  I encouraged patient to follow-up with their provider in the next 48 hours for recheck. She has arranged for PCP appointment.   Patient counseled to return if they have weakness in their arms or legs, slurred speech, trouble walking or talking, confusion, trouble with their balance, or if they have any other concerns. Patient verbalizes understanding and agrees with plan.                                    Medical Decision Making Amount and/or Complexity of Data Reviewed Labs: ordered. Radiology: ordered.   For this patient's complaint of chest pain, the following emergent conditions were considered on the differential diagnosis: acute coronary syndrome, pulmonary embolism, pneumothorax, myocarditis, pericardial tamponade, aortic dissection, thoracic aortic aneurysm complication, esophageal perforation.   Other causes were also considered including: gastroesophageal reflux disease, musculoskeletal pain including costochondritis, pneumonia/pleurisy, herpes zoster, pericarditis.  In regards to possibility of ACS, patient has atypical features of pain and negative troponin(s).  Patient with nonspecific changes on EKG.  She reports history of abnormal EKG, no old for comparison today.  Heart score equals 3, low risk.  No exertional features.  No associated vomiting.    In regards to possibility of PE, symptoms are atypical for PE and risk profile is low, making PE  low likelihood.   In regards to the patient's headache, critical differentials were considered including subarachnoid hemorrhage, intracerebral hemorrhage, epidural/subdural hematoma, pituitary apoplexy, vertebral/carotid artery dissection, giant cell arteritis, central venous thrombosis, reversible cerebral vasoconstriction, acute angle closure glaucoma, idiopathic intracranial hypertension, bacterial meningitis, viral encephalitis, carbon monoxide poisoning, posterior reversible  encephalopathy syndrome, pre-eclampsia.   Reg flag symptoms related to these causes were considered including systemic symptoms (fever, weight loss), neurologic symptoms (confusion, mental status change, vision change, associated seizure), acute or sudden thunderclap onset, patient age 49 or older with new or progressive headache, patient of any age with first headache or change in headache pattern, pregnant or postpartum status, history of HIV or other immunocompromise, headache occurring with exertion, associated neck or shoulder pain, associated traumatic injury, concurrent use of anticoagulation, family history of spontaneous SAH, and concurrent drug use.    Other benign, more common causes of headache were considered including migraine, tension-type headache, cluster headache, referred pain from other cause such as sinus infection, dental pain, trigeminal neuralgia.   On exam, patient has a reassuring neuro exam including baseline mental status, no significant neck pain or meningeal signs, no signs of severe infection or fever.   The patient's vital signs, pertinent lab work and imaging were reviewed and interpreted as discussed in the ED course. Hospitalization was considered for further testing, treatments, or serial exams/observation. However as patient is well-appearing, has a stable exam over the course of their evaluation, and reassuring studies today, I do not feel that they warrant admission at this time. This plan  was discussed with the patient who verbalizes agreement and comfort with this plan and seems reliable and able to return to the Emergency Department with worsening or changing symptoms.       Final diagnoses:  Precordial pain  Acute nonintractable headache, unspecified headache type    ED Discharge Orders     None          Desiderio Chew, PA-C 09/24/23 1846    Geraldene Hamilton, MD 09/25/23 [phone]

## 2023-09-24 NOTE — Telephone Encounter (Signed)
 Called patient states that symptoms started about a week ago. She states that she started having jaw pain that is b/l searing/burning pain that is improved with otc medication. She states she has been having some sharp pain in middle of back to day and very swimmy headed. She is not feeling good at all she is having some nasua but not vomiting. She has also had increased sweating in the last week or so.   Advised patient that she needs to be seen today she has agreed to go to urgent care for evaluation now. I will cancel appointment for our office tomorrow.

## 2023-09-24 NOTE — Telephone Encounter (Signed)
 Agree with immediate evaluation

## 2023-09-25 ENCOUNTER — Ambulatory Visit: Admitting: General Practice

## 2023-09-28 ENCOUNTER — Ambulatory Visit: Admitting: Internal Medicine

## 2023-09-28 DIAGNOSIS — Z85020 Personal history of malignant carcinoid tumor of stomach: Secondary | ICD-10-CM

## 2023-09-28 DIAGNOSIS — R768 Other specified abnormal immunological findings in serum: Secondary | ICD-10-CM

## 2023-10-05 ENCOUNTER — Ambulatory Visit: Payer: Self-pay | Attending: Internal Medicine | Admitting: Internal Medicine

## 2023-10-05 ENCOUNTER — Encounter: Payer: Self-pay | Admitting: Internal Medicine

## 2023-10-05 VITALS — BP 126/77 | HR 66 | Temp 98.1°F | Resp 16 | Ht 60.0 in | Wt 166.0 lb

## 2023-10-05 DIAGNOSIS — G894 Chronic pain syndrome: Secondary | ICD-10-CM

## 2023-10-05 DIAGNOSIS — R768 Other specified abnormal immunological findings in serum: Secondary | ICD-10-CM | POA: Diagnosis not present

## 2023-10-05 DIAGNOSIS — M159 Polyosteoarthritis, unspecified: Secondary | ICD-10-CM | POA: Diagnosis not present

## 2023-10-05 MED ORDER — CYCLOBENZAPRINE HCL 5 MG PO TABS: 5.0000 mg | ORAL_TABLET | Freq: Every evening | ORAL | 1 refills | Status: DC | PRN

## 2023-10-05 MED ORDER — PREGABALIN 50 MG PO CAPS: 50.0000 mg | ORAL_CAPSULE | Freq: Two times a day (BID) | ORAL | 2 refills | Status: AC

## 2023-10-05 NOTE — Progress Notes (Signed)
 Office Visit Note  Patient: Debra Adkins             Date of Birth: 11-15-1960           MRN: [id number]             PCP: Wendee Lynwood HERO, NP Referring: Wendee Lynwood HERO, NP Visit Date: 10/05/2023   Subjective:   Discussed the use of AI scribe software for clinical note transcription with the patient, who gave verbal consent to proceed.  History of Present Illness   Debra Adkins is a 63 y.o. female here for follow up  with joint pain and headaches, abdominal pain, and diarrhea. We had prescribed HCQ for suspected inflammatory arthritis due to positive ANA and continued symptoms.  Approximately two weeks ago, she experienced profuse sweating while lying in bed, followed by jaw tightness and pain. Antacids provided some relief. The following morning, similar symptoms occurred while sitting, including sweating, nausea, and feeling 'real sweaty headed.' She went to the emergency room where lab tests showed abnormal platelet and calcium levels, and slightly elevated hemoglobin.  She has history of carcinoid syndrome.  She has been experiencing skin issues, including red, blistered ears after sun exposure and clear, water-filled blisters on her face and forehead. These symptoms began around the same time as her emergency room visit. She also reports new onset headaches and nausea, for which she has been taking Phenergan .  She has been taking hydroxychloroquine  for the past two weeks, initially daily and now every other day, which she feels is helping slightly with her symptoms. She also takes Tylenol  as needed for pain.  She describes numbness in her toe and has been experiencing swelling in her hands and pain in her legs, which limits her ability to walk and perform daily activities. She has been trying to manage the pain with stretches and Tylenol .  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       Previous HPI 07/09/2023 Debra Adkins is a 63 y.o. female here for follow up with joint pain and headaches, abdominal pain, and diarrhea. She had interval evaluation with EGD and colonoscopy with [facility] with findings for  carcinoid tumor and gastritis who presents with ongoing gastrointestinal symptoms and joint pain.   She experiences persistent gastrointestinal issues, including lower abdominal pain. An endoscopy revealed gastritis-related changes. She is scheduled for a 24-hour urine test to evaluate carcinoid tumor activity. Flushing symptoms are present, and steroid treatment provided minimal relief for abdominal pain but caused sleep disturbances. She uses sleep aids reluctantly.   She reports widespread bruising and swelling, particularly around the site of her previous thyroidectomy, describing the swelling as tender. Fatigue and lack of energy are noted, along with facial flushing. She experiences frequent sweating, even in cool environments, and a sensation of her legs falling asleep with associated pain.   Joint pain is persistent and impacts daily life. A history of a positive ANA test is noted. Swelling is present in her fingers and toes, but without discoloration.     Previous HPI 04/21/2023 Debra Adkins is a 63 year old female who presents with joint pain and headaches. She is accompanied by her husband.   She experiences joint pain in multiple areas, which has worsened following recent surgery. She has not initiated any new medications due to concerns about gastrointestinal health, especially after a recent colonoscopy  confirmed diverticulitis. She reports significant morning stiffness lasting a couple of hours, difficulty with mobility, and weight gain due to reduced activity.   She underwent a meniscus repair, which took a long time to heal. She reports persistent issues with fluid accumulation in the knee, which was aspirated, and a scaly  area that did not heal until she applied clobetasol . The knee remains painful, especially when descending stairs.   She describes experiencing headaches as a new symptom, located across her head and accompanied by a sensation of heat and sweating. She also reports balance issues and short-term memory problems, which her husband has noticed.   She reports chronic diarrhea for the past six months, which has been persistent and problematic.   She has a history of carcinoid tumors and reports flushing episodes, though these feel different from previous episodes. No recent breathing difficulties and she has not used her inhaler for some time.   She describes pain in her heel, which has been present for about two months. The pain is persistent, sometimes throbbing, and is exacerbated by pressure. It is particularly painful at night.       Previous HPI 10/24/2022 Debra Adkins is a 63 y.o. female here for follow up for chronic joint pain and rashes with positive ANA.  Our initial exam was mostly unremarkable and lab test for systemic inflammation were all normal.  She is back today for follow-up to review labs also due to new onset of joint pain and swelling in her left knee.  This just started after standing up from a chair after a few steps felt a popping sensation in the knee and then the sensation that the joint was going to give way.  Since then has severe pain with pressure and with weightbearing on the medial side of the joint.   Previous HPI 09/30/22 Debra Adkins is a 63 y.o. female here for evaluation of positive ANA associated with joint pains fatigue sensitivity and rashes.  Medical history is also significant for hypothyroidism due to resection for Hashimoto's thyroiditis, gastric surgery for carcinoid tumor, moderate persistent asthma, and chronic venous insufficiency. She notices new and increased symptoms during the past 6 to 12 months.  Did not recall any specific event or medical change.   Main thing in the past few years had been a few episodes of COVID though none with severe complications.  She did sustain a fall landing on her left elbow in 2022 with associated left arm pain.  She had x-rays of the low back and the elbow last year with mild degenerative changes but not particularly significant compared to symptoms.  Also had ultrasound exam ruling out upper extremity DVT due to persistent swelling.  Subsequent elbow MRI consistent with tendinosis or partial thickness tear with associated edema.  Besides this her main joint pain and she has been in the back and at the hips.  This is limiting her walking ability previously was very active walking 5 dogs now cannot tolerate a lot due to pain in her hip also with pain and numbness in her toes.  She feels the left hip gets sharp pain anteriorly that comes and goes.  Also gets lateral hip pain bothers him trying to sleep.  Pain throughout her back from upper to lower.  She is also been experiencing a sensitivity to light touch and frequent burning painful sensation in her skin mostly in her torso and proximal upper body.  She was prescribed Flexeril  for muscle spasms in June not  currently on any other prescription medication for musculoskeletal or nerve pain. Evaluation in primary care office in April showed a high TSH and low vitamin D  these were corrected by follow-up in July.  Also takes B12 supplementation. Has noticed skin rash with redness at the front of the neck and sometimes across the bridge of the nose and central face.  Most often sees this after waking up individual episodes lasting minutes or up to a few hours.  Does not particularly associated with sun exposure. She notices hair and fingernail thinning and brittleness.  Has dry eyes and mouth 1 recent sore on the inside of right cheek. Fingers and toes often cold or numb without visible discoloration. Sleep quality is very poor describes several hours trying to fall asleep each night  despite feeling excessively tired.  Has never had a sleep study has been told that she snores this was apparently worse when she weighed more. She does report short term memory problems and poor concentration. She has diarrhea chronically. Some episodes of dizziness not having headaches.   Labs reviewed ANA 1:80 homogenous RF neg CCP neg MCV neg  Review of Systems  Constitutional:  Positive for fatigue.  HENT:  Positive for mouth sores and mouth dryness.   Eyes:  Positive for dryness.  Respiratory:  Negative for shortness of breath.   Cardiovascular:  Positive for chest pain and palpitations.  Gastrointestinal:  Positive for constipation and diarrhea. Negative for blood in stool.  Endocrine: Negative for increased urination.  Genitourinary:  Negative for involuntary urination.  Musculoskeletal:  Positive for joint pain, gait problem, joint pain, joint swelling, myalgias, muscle weakness, morning stiffness, muscle tenderness and myalgias.  Skin:  Positive for color change, rash, hair loss and sensitivity to sunlight.  Allergic/Immunologic: Positive for susceptible to infections.  Neurological:  Positive for dizziness and headaches.  Hematological:  Positive for swollen glands.  Psychiatric/Behavioral:  Positive for sleep disturbance. Negative for depressed mood. The patient is not nervous/anxious.     PMFS History:  Patient Active Problem List   Diagnosis Date Noted   Generalized osteoarthritis of multiple sites 10/05/2023   Chronic pain syndrome 10/05/2023   Enthesitis 04/21/2023   Left lower quadrant abdominal pain 04/03/2023   Pain in left knee 10/24/2022   Other insomnia 10/17/2022   Positive ANA (antinuclear antibody) 09/30/2022   Fall 08/05/2022   Skin lesion 08/05/2022   Obesity (BMI 30-39.9) 08/05/2022   Vitamin D  deficiency 08/05/2022   Depression, major, single episode, mild 05/19/2022   Multiple joint pain 05/19/2022   Lateral epicondylitis, left elbow 08/22/2021    Chronic venous insufficiency 08/02/2021   Left arm pain 06/05/2021   Lumbar pain 05/15/2021   Left elbow pain 05/15/2021   Preventative health care 05/15/2021   Varicose veins of both lower extremities with pain 05/15/2021   Hiatal hernia 05/02/2020   History of malignant carcinoid tumor of stomach 05/02/2020   B12 deficiency 10/20/2019   History of gastric surgery 10/20/2019   Hypothyroidism due to Hashimoto's thyroiditis 10/20/2019   Moderate persistent asthma without complication 10/20/2019   COVID-19 03/17/2019    Past Medical History:  Diagnosis Date   Allergy    Asthma    Blood transfusion without reported diagnosis    Carcinoid tumor of colon    Followed by Dr. Anil Tumbarpurra in [place]   Chicken pox    Constipation    Diverticulitis    Diverticulosis    Hypertension    Thyroid  disease    hoshimotos's  thyroiditis at age 58    Family History  Problem Relation Age of Onset   Heart disease Father    Hyperlipidemia Father    Other Father        Heart transplant   Diabetes Brother    Heart disease Brother    Testicular cancer Brother    Heart disease Brother    Heart attack Brother    Past Surgical History:  Procedure Laterality Date   ABDOMINAL HYSTERECTOMY     states took everything but cervix?   DG GALL BLADDER     KNEE SURGERY Left 2025   LAPAROSCOPIC GASTRIC SLEEVE RESECTION     TENNIS ELBOW RELEASE/NIRSCHEL PROCEDURE Left 01/24/2022   Procedure: LEFT LATERAL EPICONDYLE DEBRIDEMENT, REPAIR AS INIDICATED;  Surgeon: Jerri Kay HERO, MD;  Location: [facility];  Service: Orthopedics;  Laterality: Left;   THYROIDECTOMY     Social History   Social History Narrative   05/02/20   From: [place] , but moved when she was high school   Living: with Eloy, husband (2019) but together since 2005   Work: not currently due to health issues      Family: brother in [place]      Enjoys: rescue dogs - has 5      Exercise: not currently due to joint  issues   Diet: low calorie, salads      Safety   Seat belts: Yes    Guns: Yes  and secure   Safe in relationships: Yes    Immunization History  Administered Date(s) Administered   Tdap 08/05/2022     Objective: Vital Signs: BP 126/77   Pulse 66   Temp 98.1 F (36.7 C)   Resp 16   Ht 5' (1.524 m)   Wt 166 lb (75.3 kg)   BMI 32.42 kg/m    Physical Exam Eyes:     Conjunctiva/sclera: Conjunctivae normal.  Cardiovascular:     Rate and Rhythm: Normal rate and regular rhythm.  Pulmonary:     Effort: Pulmonary effort is normal.     Breath sounds: Normal breath sounds.  Skin:    General: Skin is warm and dry.  Neurological:     Mental Status: She is alert.  Psychiatric:        Mood and Affect: Mood normal.      Musculoskeletal Exam:  Shoulders full ROM no tenderness or swelling Elbows full ROM no tenderness or swelling Wrists full ROM no tenderness or swelling Fingers full ROM, some tenderness to pressure without synovitis Back muscles tense with mild guarding and tenderness, no radiation Knees full ROM no tenderness or swelling Ankles full ROM, bony nodules at achilles tendon insertion R>L  Investigation: No additional findings.  Imaging: CT Head Wo Contrast Result Date: 09/24/2023 CLINICAL DATA:  Headache, new onset (Age >= 51y) Patient states bilateral jaw pain for the past several weeks that has progressively gotten worse. Also reports an increase in sweating and dizziness over the same time period. EXAM: CT HEAD WITHOUT CONTRAST TECHNIQUE: Contiguous axial images were obtained from the base of the skull through the vertex without intravenous contrast. RADIATION DOSE REDUCTION: This exam was performed according to the departmental dose-optimization program which includes automated exposure control, adjustment of the mA and/or kV according to patient size and/or use of iterative reconstruction technique. COMPARISON:  None Available. FINDINGS: Brain: No evidence of  large-territorial acute infarction. No parenchymal hemorrhage. No mass lesion. No extra-axial collection. No mass effect or midline shift. No hydrocephalus.  Basilar cisterns are patent. Vascular: No hyperdense vessel. Skull: No acute fracture or focal lesion. Sinuses/Orbits: Paranasal sinuses and mastoid air cells are clear. Bilateral lens replacement. Otherwise the orbits are unremarkable. Other: None. IMPRESSION: No acute intracranial abnormality. Electronically Signed   By: Morgane  Naveau M.D.   On: 09/24/2023 18:06   DG Chest 2 View Result Date: 09/24/2023 CLINICAL DATA:  Chest pain. EXAM: CHEST - 2 VIEW COMPARISON:  March 20, 2022. FINDINGS: The heart size and mediastinal contours are within normal limits. Both lungs are clear. The visualized skeletal structures are unremarkable. IMPRESSION: No active cardiopulmonary disease. Electronically Signed   By: Lynwood Landy Raddle M.D.   On: 09/24/2023 15:34    Recent Labs: Lab Results  Component Value Date   WBC 8.3 09/24/2023   HGB 15.1 (H) 09/24/2023   PLT 212 09/24/2023   NA 141 09/24/2023   K 3.7 09/24/2023   CL 102 09/24/2023   CO2 24 09/24/2023   GLUCOSE 94 09/24/2023   BUN 12 09/24/2023   CREATININE 0.73 09/24/2023   BILITOT 0.6 04/03/2023   ALKPHOS 64 04/03/2023   AST 17 04/03/2023   ALT 19 04/03/2023   PROT 7.5 04/03/2023   ALBUMIN 4.7 04/03/2023   CALCIUM 10.6 (H) 09/24/2023    Speciality Comments: Patient would like 90 day refills when able, instead of 30 with 2 refills  Procedures:  No procedures performed Allergies: Chocolate and Codeine   Assessment / Plan:     Visit Diagnoses: Generalized osteoarthritis of multiple sites - Plan: pregabalin  (LYRICA ) 50 MG capsule, cyclobenzaprine  (FLEXERIL ) 5 MG tablet Peripheral neuropathy with numbness in toes Persistent numbness in toe adjacent to the big toe. Footwear and potential steroid injections recommended by foot doctor. - Advise wearing recommended footwear. - Consider  steroid injections if symptoms persist. 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 Not much improvement noted with HCQ so don't recommend long term use with seronegative and no synovitis appreciable - Advise hand stretching exercises.  Photosensitive skin eruptions and blisters Photosensitive eruptions and blisters on ears and face post-ER visit.  Thigh and leg pain with swelling Pain and swelling in thighs and legs affecting mobility. - Recommend leg stretching exercises.  Nausea and headaches New symptoms of nausea and headaches. Phenergan  used for nausea. - Continue Phenergan  as needed.        Orders: No orders of the defined types were placed in this encounter.  Meds ordered this encounter  Medications   pregabalin  (LYRICA ) 50 MG capsule    Sig: Take 1 capsule (50 mg total) by mouth 2 (two) times daily.    Dispense:  60 capsule    Refill:  2   cyclobenzaprine  (FLEXERIL ) 5 MG tablet    Sig: Take 1 tablet (5 mg total) by mouth at bedtime as needed for muscle spasms.    Dispense:  30 tablet    Refill:  1     Follow-Up Instructions: Return in about 3 months (around 01/04/2024) for OA/neuropathy/?FMS LYR start f/u 3mos.   Lonni LELON Ester, MD  Note - This record has been created using [employer].  Chart creation errors have been sought, but may not always  have been located. Such creation errors do not reflect on  the standard of medical care.

## 2023-11-23 ENCOUNTER — Encounter: Payer: Self-pay | Admitting: Radiology

## 2023-12-14 ENCOUNTER — Emergency Department (HOSPITAL_BASED_OUTPATIENT_CLINIC_OR_DEPARTMENT_OTHER)
Admission: EM | Admit: 2023-12-14 | Discharge: 2023-12-14 | Disposition: A | Discharge status: Home / Self Care | Attending: Emergency Medicine | Admitting: Emergency Medicine

## 2023-12-14 ENCOUNTER — Emergency Department (HOSPITAL_BASED_OUTPATIENT_CLINIC_OR_DEPARTMENT_OTHER)

## 2023-12-14 ENCOUNTER — Other Ambulatory Visit: Payer: Self-pay

## 2023-12-14 DIAGNOSIS — S61451A Open bite of right hand, initial encounter: Secondary | ICD-10-CM | POA: Diagnosis not present

## 2023-12-14 DIAGNOSIS — Z23 Encounter for immunization: Secondary | ICD-10-CM | POA: Insufficient documentation

## 2023-12-14 DIAGNOSIS — S61411A Laceration without foreign body of right hand, initial encounter: Secondary | ICD-10-CM | POA: Diagnosis not present

## 2023-12-14 DIAGNOSIS — Z79899 Other long term (current) drug therapy: Secondary | ICD-10-CM | POA: Diagnosis not present

## 2023-12-14 DIAGNOSIS — W540XXA Bitten by dog, initial encounter: Secondary | ICD-10-CM | POA: Diagnosis not present

## 2023-12-14 DIAGNOSIS — S6991XA Unspecified injury of right wrist, hand and finger(s), initial encounter: Secondary | ICD-10-CM | POA: Diagnosis present

## 2023-12-14 MED ORDER — OXYCODONE-ACETAMINOPHEN 5-325 MG PO TABS
2.0000 | ORAL_TABLET | Freq: Once | ORAL | Status: AC
  Administered 2023-12-14: 2 via ORAL
  Filled 2023-12-14: qty 2

## 2023-12-14 MED ORDER — LIDOCAINE-EPINEPHRINE (PF) 2 %-1:200000 IJ SOLN
10.0000 mL | Freq: Once | INTRAMUSCULAR | Status: AC
  Administered 2023-12-14: 10 mL
  Filled 2023-12-14: qty 20

## 2023-12-14 MED ORDER — OXYCODONE-ACETAMINOPHEN 5-325 MG PO TABS: 1.0000 | ORAL_TABLET | Freq: Three times a day (TID) | ORAL | 0 refills | Status: DC | PRN

## 2023-12-14 MED ORDER — CEFAZOLIN SODIUM-DEXTROSE 1-4 GM/50ML-% IV SOLN
1.0000 g | Freq: Once | INTRAVENOUS | Status: AC
  Administered 2023-12-14: 1 g via INTRAVENOUS
  Filled 2023-12-14: qty 50

## 2023-12-14 MED ORDER — AMOXICILLIN-POT CLAVULANATE 875-125 MG PO TABS
1.0000 | ORAL_TABLET | Freq: Once | ORAL | Status: DC
  Filled 2023-12-14: qty 1

## 2023-12-14 MED ORDER — MORPHINE SULFATE (PF) 4 MG/ML IV SOLN
4.0000 mg | Freq: Once | INTRAVENOUS | Status: AC
  Administered 2023-12-14: 4 mg via INTRAVENOUS
  Filled 2023-12-14: qty 1

## 2023-12-14 MED ORDER — NAPROXEN 500 MG PO TABS: 500.0000 mg | ORAL_TABLET | Freq: Two times a day (BID) | ORAL | 0 refills | Status: AC

## 2023-12-14 MED ORDER — AMOXICILLIN-POT CLAVULANATE 875-125 MG PO TABS: 1.0000 | ORAL_TABLET | Freq: Two times a day (BID) | ORAL | 0 refills | Status: DC

## 2023-12-14 NOTE — ED Triage Notes (Signed)
 30 min PTA right hand bit by own dog- hx previous aggression. Significant penetration into body of hand. Multiple punctures and large tear. Pain 10:10. Pt near syncope on arrival. Dog is vaccinated.

## 2023-12-14 NOTE — Discharge Instructions (Addendum)
 It was a pleasure meeting with you today. I have sent pain medication and antibiotics to your pharmacy.  As we discussed Percocet for can be used as needed for severe pain.  We have given you a dose of antibiotic here so you can begin the Augmentin  tomorrow.  Sutured repair Keep the laceration site dry for the next 24 hours and leave the dressing in place. After 24 hours you may remove the dressing and gently clean the laceration site with antibacterial soap and warm water. Do not scrub the area. Do not soak the area and water for long periods of time. Don't use hydrogen peroxide, iodine-based solutions, or alcohol, which can slow healing, and will probably be painful! Schedule an appointment with hand surgery (information provided) for suture removal in 7-10 days and further evaluation of healing. You should return sooner for any signs of infection which would include increased redness around the wound, increased swelling, new drainage of yellow pus.

## 2023-12-14 NOTE — ED Provider Notes (Signed)
 [place] EMERGENCY DEPARTMENT AT [facility] Provider Note   CSN: [id number] Arrival date & time: 12/14/23  8252     Patient presents with: No chief complaint on file.   Debra Adkins is a 63 y.o. female.   Patient is here for evaluation after dog bite to the right hand.  She is accompanied by her husband today.  This happened about 30 minutes ago when she was trying to break up a fight between her 2 dogs.  The dog is up-to-date on rabies vaccination.  She denies fall, head injury, or loss of consciousness.  She is not taking blood thinner. Tetanus is up-to-date.  The history is provided by the patient and the spouse.       Prior to Admission medications   Medication Sig Start Date End Date Taking? Authorizing Provider  amoxicillin -clavulanate (AUGMENTIN ) 875-125 MG tablet Take 1 tablet by mouth every 12 (twelve) hours. 12/14/23  Yes Rosina Almarie LABOR, PA-C  naproxen  (NAPROSYN ) 500 MG tablet Take 1 tablet (500 mg total) by mouth 2 (two) times daily. 12/14/23  Yes Rosina Almarie LABOR, PA-C  oxyCODONE -acetaminophen  (PERCOCET/ROXICET) 5-325 MG tablet Take 1 tablet by mouth every 8 (eight) hours as needed for up to 5 doses for severe pain (pain score 7-10). 12/14/23  Yes Rosina Almarie LABOR, PA-C  albuterol  (VENTOLIN  HFA) 108 (90 Base) MCG/ACT inhaler Inhale 2 puffs into the lungs every 6 (six) hours as needed for wheezing or shortness of breath. 03/20/22   Piontek, Rocky, MD  cholestyramine light (PREVALITE) 4 g packet Take by mouth. Patient not taking: Reported on 10/05/2023 07/01/23   [provider]  clobetasol  cream (TEMOVATE ) 0.05 % Apply 1 Application topically 2 (two) times daily. 08/05/22   Wendee Lynwood HERO, NP  cyanocobalamin  (VITAMIN B12) 1000 MCG/ML injection INJECT 1 ML (1,000 MCG TOTAL) INTO THE SKIN EVERY 7 (SEVEN) DAYS. 08/14/22   Wendee Lynwood HERO, NP  cyclobenzaprine  (FLEXERIL ) 5 MG tablet Take 1 tablet (5 mg total) by mouth at bedtime as needed for muscle  spasms. 10/05/23   Rice, Lonni ORN, MD  levothyroxine  (SYNTHROID ) 125 MCG tablet TAKE 1 TAB EVERY MORNING ON EMPTY STOMACH W/ WATER ONLY. NO FOOD OR OTHER MEDICATIONS FOR 30 MINUTES 05/01/23   Wendee Lynwood HERO, NP  Pediatric Multiple Vitamins (FLINTSTONES MULTIVITAMIN PO) Take by mouth.    [provider]  pregabalin  (LYRICA ) 50 MG capsule Take 1 capsule (50 mg total) by mouth 2 (two) times daily. 10/05/23   Rice, Lonni ORN, MD  promethazine  (PHENERGAN ) 25 MG tablet Take 12.5-25 mg by mouth 3 (three) times daily as needed. 04/16/20   [provider]  sertraline  (ZOLOFT ) 50 MG tablet TAKE 1 TABLET BY MOUTH EVERY DAY 05/01/23   Wendee Lynwood HERO, NP  Syringe/Needle, Disp, 25G X 1 1 ML MISC Use one syringe to administer B12 injection 08/10/23   Wendee Lynwood HERO, NP  Vitamin D , Ergocalciferol , (DRISDOL ) 1.25 MG (50000 UNIT) CAPS capsule Take 1 capsule (50,000 Units total) by mouth every 7 (seven) days. 07/28/22   Wendee Lynwood HERO, NP    Allergies: Chocolate and Codeine    Review of Systems  Skin:  Positive for wound (Right hand).    Updated Vital Signs BP 136/81 (BP Location: Right Arm)   Pulse 77   Temp 99.2 F (37.3 C) (Oral)   Resp 18   SpO2 93%   Physical Exam Vitals and nursing note reviewed.  Constitutional:      Appearance: Normal appearance.  HENT:  Head: Normocephalic and atraumatic.  Eyes:     Extraocular Movements: Extraocular movements intact.  Cardiovascular:     Pulses: Normal pulses.  Pulmonary:     Effort: Pulmonary effort is normal.  Skin:    General: Skin is warm and dry.     Coloration: Skin is not jaundiced or pale.     Comments: Avulsion of skin of the right hand secondary to dog bite (see photo below).  Small puncture wound to the dorsal right hand.  Small laceration to the base of middle finger.  Neurological:     Mental Status: She is alert and oriented to person, place, and time.      Media Information  Document Information  Photos     12/14/2023 19:27  Attached To:  Hospital Encounter on 12/14/23  Source Information  Neldon Hamp RAMAN, [place]  [facility]   (all labs ordered are listed, but only abnormal results are displayed) Labs Reviewed - No data to display  EKG: None  Radiology: DG Hand Complete Right Result Date: 12/14/2023 EXAM: 3 OR MORE VIEW(S) XRAY OF THE HAND 12/14/2023 06:57:00 PM COMPARISON: 04/24/2023 CLINICAL HISTORY: Recent dog bite. FINDINGS: BONES AND JOINTS: A stable metallic foreign body is noted in the distal aspect of the 2nd digit. No acute fracture. No joint dislocation. SOFT TISSUES: Air is noted in the subcutaneous tissues along the dorsum of the hand, consistent with the recent dog bite. IMPRESSION: 1. Air in the subcutaneous tissues along the dorsum of the hand, consistent with a recent dog bite. 2. No acute osseous abnormality. 3. Stable metallic foreign body in the distal aspect of the 2nd digit. Electronically signed by: Oneil Devonshire MD 12/14/2023 07:47 PM EST RP Workstation: [id number]     .Laceration Repair  Date/Time: 12/14/2023 11:32 PM  Performed by: Rosina Almarie LABOR, PA-C Authorized by: Rosina Almarie LABOR, PA-C   Consent:    Consent obtained:  Verbal   Consent given by:  Patient   Risks, benefits, and alternatives were discussed: yes     Risks discussed:  Infection, need for additional repair, nerve damage, poor wound healing, poor cosmetic result, pain, retained foreign body, tendon damage and vascular damage   Alternatives discussed:  No treatment, delayed treatment, observation and referral Universal protocol:    Procedure explained and questions answered to patient or proxy's satisfaction: yes     Relevant documents present and verified: yes     Test results available: yes     Imaging studies available: yes     Required blood products, implants, devices, and special equipment available: yes     Immediately prior to procedure, a time out was called: yes      Patient identity confirmed:  Verbally with patient, provided demographic data and hospital-assigned identification number Anesthesia:    Anesthesia method:  Local infiltration   Local anesthetic:  Lidocaine  2% WITH epi Laceration details:    Location:  Hand   Hand location:  R hand, dorsum   Length (cm):  13 Pre-procedure details:    Preparation:  Imaging obtained to evaluate for foreign bodies Exploration:    Limited defect created (wound extended): no     Imaging obtained: x-ray     Imaging outcome: foreign body not noted     Wound exploration: entire depth of wound visualized     Wound extent: no foreign body, no nerve damage, no tendon damage and no underlying fracture     Contaminated: no   Treatment:    Area  cleansed with:  Soap and water   Amount of cleaning:  Extensive   Irrigation solution:  Sterile saline   Irrigation method:  Pressure wash   Visualized foreign bodies/material removed: no     Debridement:  None   Scar revision: no   Skin repair:    Repair method:  Sutures   Suture size:  4-0   Suture material:  Prolene   Suture technique:  Simple interrupted   Number of sutures:  5 Approximation:    Approximation:  Loose Repair type:    Repair type:  Intermediate Post-procedure details:    Dressing:  Non-adherent dressing   Procedure completion:  Tolerated    Medications Ordered in the ED  morphine  (PF) 4 MG/ML injection 4 mg (4 mg Intravenous Given 12/14/23 1814)  ceFAZolin  (ANCEF ) IVPB 1 g/50 mL premix (0 g Intravenous Stopped 12/14/23 1940)  lidocaine -EPINEPHrine  (XYLOCAINE  W/EPI) 2 %-1:200000 (PF) injection 10 mL (10 mLs Infiltration Given by Other 12/14/23 1926)  morphine  (PF) 4 MG/ML injection 4 mg (4 mg Intravenous Given 12/14/23 1929)  oxyCODONE -acetaminophen  (PERCOCET/ROXICET) 5-325 MG per tablet 2 tablet (2 tablets Oral Given 12/14/23 2102)     Patient presents to the ED for concern of injury after dog bite, this involves an extensive number of  treatment options, and is a complaint that carries with it a high risk of complications and morbidity.  The differential diagnosis includes soft tissue injury, secondary infection, fracture.   Imaging Studies ordered:  I ordered imaging studies including x-ray of right hand I independently visualized and interpreted imaging which showed no foreign bodies or fractures I agree with the radiologist interpretation   Medicines ordered and prescription drug management:  I ordered medication including IV Ancef  for infection prophylaxis.  As well as lidocaine , morphine , and Percocet for pain management. Reevaluation of the patient after these medicines showed that the patient improved I have reviewed the patients home medicines and have made adjustments as needed   Problem List / ED Course:  Patient is here for evaluation of right hand injury after dog bite.  Imaging shows no foreign bodies or fractures.  Laceration repair of the right hand.  Given pain medications and prophylactic antibiotics IV.   Reevaluation:  After the interventions noted above, I reevaluated the patient and found that they have :improved   Dispostion:  After consideration of the diagnostic results and the patients response to treatment, I feel that the patent would benefit from continued supportive care in the home setting.  Antibiotics and pain medication sent to pharmacy.  Patient will schedule an appointment with hand surgeon for suture removal and further evaluation of wound healing in 7 to 10 days.  She will return if any signs of infection develop or she has any additional concerns.   Medical Decision Making Risk Prescription drug management.     Final diagnoses:  Dog bite, initial encounter    ED Discharge Orders          Ordered    amoxicillin -clavulanate (AUGMENTIN ) 875-125 MG tablet  Every 12 hours        12/14/23 2055    oxyCODONE -acetaminophen  (PERCOCET/ROXICET) 5-325 MG tablet  Every 8 hours  PRN        12/14/23 2055    naproxen  (NAPROSYN ) 500 MG tablet  2 times daily        12/14/23 2055               Rosina Almarie LABOR, [place] 12/15/23 0036  Randol Simmonds, MD 12/18/23 0900

## 2023-12-14 NOTE — ED Provider Notes (Incomplete)
 [place] EMERGENCY DEPARTMENT AT [facility] Provider Note   CSN: [id number] Arrival date & time: 12/14/23  8252     Patient presents with: No chief complaint on file.   Debra Adkins is a 63 y.o. female.   Patient is here for evaluation after dog bite to the right hand.  She is accompanied by her husband today.  This happened about 30 minutes ago when she was trying to break up a fight between her 2 dogs.  The dog is up-to-date on rabies vaccination.  She denies fall, head injury, or loss of consciousness.  She is not taking blood thinner. Tetanus is up-to-date.  The history is provided by the patient and the spouse.       Prior to Admission medications   Medication Sig Start Date End Date Taking? Authorizing Provider  amoxicillin -clavulanate (AUGMENTIN ) 875-125 MG tablet Take 1 tablet by mouth every 12 (twelve) hours. 12/14/23  Yes Rosina Almarie LABOR, PA-C  naproxen  (NAPROSYN ) 500 MG tablet Take 1 tablet (500 mg total) by mouth 2 (two) times daily. 12/14/23  Yes Rosina Almarie LABOR, PA-C  oxyCODONE -acetaminophen  (PERCOCET/ROXICET) 5-325 MG tablet Take 1 tablet by mouth every 8 (eight) hours as needed for up to 5 doses for severe pain (pain score 7-10). 12/14/23  Yes Rosina Almarie LABOR, PA-C  albuterol  (VENTOLIN  HFA) 108 (90 Base) MCG/ACT inhaler Inhale 2 puffs into the lungs every 6 (six) hours as needed for wheezing or shortness of breath. 03/20/22   Piontek, Rocky, MD  cholestyramine light (PREVALITE) 4 g packet Take by mouth. Patient not taking: Reported on 10/05/2023 07/01/23   [provider]  clobetasol  cream (TEMOVATE ) 0.05 % Apply 1 Application topically 2 (two) times daily. 08/05/22   Wendee Lynwood HERO, NP  cyanocobalamin  (VITAMIN B12) 1000 MCG/ML injection INJECT 1 ML (1,000 MCG TOTAL) INTO THE SKIN EVERY 7 (SEVEN) DAYS. 08/14/22   Wendee Lynwood HERO, NP  cyclobenzaprine  (FLEXERIL ) 5 MG tablet Take 1 tablet (5 mg total) by mouth at bedtime as needed for muscle  spasms. 10/05/23   Rice, Lonni ORN, MD  levothyroxine  (SYNTHROID ) 125 MCG tablet TAKE 1 TAB EVERY MORNING ON EMPTY STOMACH W/ WATER ONLY. NO FOOD OR OTHER MEDICATIONS FOR 30 MINUTES 05/01/23   Wendee Lynwood HERO, NP  Pediatric Multiple Vitamins (FLINTSTONES MULTIVITAMIN PO) Take by mouth.    [provider]  pregabalin  (LYRICA ) 50 MG capsule Take 1 capsule (50 mg total) by mouth 2 (two) times daily. 10/05/23   Rice, Lonni ORN, MD  promethazine  (PHENERGAN ) 25 MG tablet Take 12.5-25 mg by mouth 3 (three) times daily as needed. 04/16/20   [provider]  sertraline  (ZOLOFT ) 50 MG tablet TAKE 1 TABLET BY MOUTH EVERY DAY 05/01/23   Wendee Lynwood HERO, NP  Syringe/Needle, Disp, 25G X 1 1 ML MISC Use one syringe to administer B12 injection 08/10/23   Wendee Lynwood HERO, NP  Vitamin D , Ergocalciferol , (DRISDOL ) 1.25 MG (50000 UNIT) CAPS capsule Take 1 capsule (50,000 Units total) by mouth every 7 (seven) days. 07/28/22   Wendee Lynwood HERO, NP    Allergies: Chocolate and Codeine    Review of Systems  Skin:  Positive for wound (Right hand).    Updated Vital Signs BP 136/81 (BP Location: Right Arm)   Pulse 77   Temp 99.2 F (37.3 C) (Oral)   Resp 18   SpO2 93%   Physical Exam Vitals and nursing note reviewed.  Constitutional:      Appearance: Normal appearance.  HENT:  Head: Normocephalic and atraumatic.  Eyes:     Extraocular Movements: Extraocular movements intact.  Cardiovascular:     Pulses: Normal pulses.  Pulmonary:     Effort: Pulmonary effort is normal.  Skin:    General: Skin is warm and dry.     Coloration: Skin is not jaundiced or pale.     Comments: Avulsion of skin of the right hand secondary to dog bite (see photo below).  Small puncture wound to the dorsal right hand.  Small laceration to the base of middle finger.  Neurological:     Mental Status: She is alert and oriented to person, place, and time.      Media Information  Document Information  Photos     12/14/2023 19:27  Attached To:  Hospital Encounter on 12/14/23  Source Information  Neldon Hamp RAMAN, [place]  [facility]   (all labs ordered are listed, but only abnormal results are displayed) Labs Reviewed - No data to display  EKG: None  Radiology: DG Hand Complete Right Result Date: 12/14/2023 EXAM: 3 OR MORE VIEW(S) XRAY OF THE HAND 12/14/2023 06:57:00 PM COMPARISON: 04/24/2023 CLINICAL HISTORY: Recent dog bite. FINDINGS: BONES AND JOINTS: A stable metallic foreign body is noted in the distal aspect of the 2nd digit. No acute fracture. No joint dislocation. SOFT TISSUES: Air is noted in the subcutaneous tissues along the dorsum of the hand, consistent with the recent dog bite. IMPRESSION: 1. Air in the subcutaneous tissues along the dorsum of the hand, consistent with a recent dog bite. 2. No acute osseous abnormality. 3. Stable metallic foreign body in the distal aspect of the 2nd digit. Electronically signed by: Oneil Devonshire MD 12/14/2023 07:47 PM EST RP Workstation: [id number]     .Laceration Repair  Date/Time: 12/14/2023 11:32 PM  Performed by: Rosina Almarie LABOR, PA-C Authorized by: Rosina Almarie LABOR, PA-C   Consent:    Consent obtained:  Verbal   Consent given by:  Patient   Risks, benefits, and alternatives were discussed: yes     Risks discussed:  Infection, need for additional repair, nerve damage, poor wound healing, poor cosmetic result, pain, retained foreign body, tendon damage and vascular damage   Alternatives discussed:  No treatment, delayed treatment, observation and referral Universal protocol:    Procedure explained and questions answered to patient or proxy's satisfaction: yes     Relevant documents present and verified: yes     Test results available: yes     Imaging studies available: yes     Required blood products, implants, devices, and special equipment available: yes     Immediately prior to procedure, a time out was called: yes      Patient identity confirmed:  Verbally with patient, provided demographic data and hospital-assigned identification number Anesthesia:    Anesthesia method:  Local infiltration   Local anesthetic:  Lidocaine  2% WITH epi Laceration details:    Location:  Hand   Hand location:  R hand, dorsum Pre-procedure details:    Preparation:  Imaging obtained to evaluate for foreign bodies Exploration:    Limited defect created (wound extended): no     Imaging obtained: x-ray     Imaging outcome: foreign body not noted     Wound exploration: entire depth of wound visualized     Wound extent: no foreign body, no nerve damage, no tendon damage and no underlying fracture     Contaminated: no   Treatment:    Area cleansed with:  Soap and water  Amount of cleaning:  Extensive   Irrigation solution:  Sterile saline   Irrigation method:  Pressure wash   Visualized foreign bodies/material removed: no     Debridement:  None   Scar revision: no   Skin repair:    Repair method:  Sutures   Suture size:  4-0   Suture material:  Prolene   Suture technique:  Simple interrupted   Number of sutures:  5 Approximation:    Approximation:  Loose Repair type:    Repair type:  Intermediate Post-procedure details:    Dressing:  Non-adherent dressing   Procedure completion:  Tolerated    Medications Ordered in the ED  morphine  (PF) 4 MG/ML injection 4 mg (4 mg Intravenous Given 12/14/23 1814)  ceFAZolin  (ANCEF ) IVPB 1 g/50 mL premix (0 g Intravenous Stopped 12/14/23 1940)  lidocaine -EPINEPHrine  (XYLOCAINE  W/EPI) 2 %-1:200000 (PF) injection 10 mL (10 mLs Infiltration Given by Other 12/14/23 1926)  morphine  (PF) 4 MG/ML injection 4 mg (4 mg Intravenous Given 12/14/23 1929)  oxyCODONE -acetaminophen  (PERCOCET/ROXICET) 5-325 MG per tablet 2 tablet (2 tablets Oral Given 12/14/23 2102)     Patient presents to the ED for concern of injury after dog bite, this involves an extensive number of treatment options, and  is a complaint that carries with it a high risk of complications and morbidity.  The differential diagnosis includes soft tissue injury, secondary infection, fracture.   Imaging Studies ordered:  I ordered imaging studies including x-ray of right hand I independently visualized and interpreted imaging which showed no foreign bodies or fractures I agree with the radiologist interpretation   Medicines ordered and prescription drug management:  I ordered medication including IV Ancef  for infection prophylaxis.  As well as lidocaine , morphine , and Percocet for pain management. Reevaluation of the patient after these medicines showed that the patient improved I have reviewed the patients home medicines and have made adjustments as needed   Problem List / ED Course:  Patient is here for evaluation of right hand injury after dog bite.  Imaging shows no foreign bodies or fractures.  Laceration repair of the right hand.  Given pain medications and prophylactic antibiotics IV.   Reevaluation:  After the interventions noted above, I reevaluated the patient and found that they have :improved   Dispostion:  After consideration of the diagnostic results and the patients response to treatment, I feel that the patent would benefit from continued supportive care in the home setting.  Antibiotics and pain medication sent to pharmacy.  Patient will schedule an appointment with hand surgeon for suture removal and further evaluation of wound healing in 7 to 10 days.  She will return if any signs of infection develop or she has any additional concerns.   Medical Decision Making Risk Prescription drug management.     Final diagnoses:  Dog bite, initial encounter    ED Discharge Orders          Ordered    amoxicillin -clavulanate (AUGMENTIN ) 875-125 MG tablet  Every 12 hours        12/14/23 2055    oxyCODONE -acetaminophen  (PERCOCET/ROXICET) 5-325 MG tablet  Every 8 hours PRN        12/14/23  2055    naproxen  (NAPROSYN ) 500 MG tablet  2 times daily        12/14/23 2055

## 2023-12-16 ENCOUNTER — Telehealth: Payer: Self-pay

## 2023-12-16 NOTE — Telephone Encounter (Signed)
 Patients husband Rossie called to make a 1 week F/U with Dr. Erwin.  CB# [phone].  Please advise.  Thank you.

## 2023-12-16 NOTE — Telephone Encounter (Signed)
 Scheduled for 12/1, husband is really worried about her hand as he states that is it turning purple. I advised him that if he wishes for her to be seen before her appointment then to take her to the ED or UC.

## 2023-12-20 NOTE — Progress Notes (Unsigned)
 Debra Adkins - 63 y.o. female MRN [id number]  Date of birth: [birth year]  Office Visit Note: Visit Date: 12/21/2023 PCP: Wendee Lynwood HERO, NP Referred by: Wendee Lynwood HERO, NP  Subjective: No chief complaint on file.  HPI: Debra Adkins is a pleasant 63 y.o. female who presents today for evaluation of a right hand dog bite injury sustained 1 week prior.  She was seen in the emergency department setting the day of injury, underwent bedside irrigation and closure.  There was notable flap creation over the dorsal aspect of the right hand with discoloration.  Has been compliant with basic wound care since that time, has been taking oral antibiotics as instructed.  Having ongoing pain in this region, no systemic symptoms.  Pertinent ROS were reviewed with the patient and found to be negative unless otherwise specified above in HPI.   Visit Reason: right hand dog bite Duration of symptoms: 12/14/23 Hand dominance: right Occupation: retired Diabetic: No Smoking: No Heart/Lung History: asthma Blood Thinners: none  Prior Testing/EMG: x-rays Injections (Date): none Treatments: antibiotics Prior Surgery:    Assessment & Plan: Visit Diagnoses:  1. Dog bite, hand, right, initial encounter     Plan: Based on clinical examination today, she is demonstrating appropriate ongoing healing of the right hand dog bite injury.  As previously seen in the emergency department setting, there is notable skin flap elevation of the dorsal aspect of the hand.  This does remain with vascularity currently, no evidence of ongoing skin necrosis.  There is no evidence of recurrent infection in this region on clinical examination today.  I have recommended that she continue Augmentin  for additional 1 week with ongoing daily dressing changes.  Strict return precautions were discussed in detail today.  Follow-up in 1 week for wound check.  I spent 30 minutes in the care of this patient today including review of previous  documentation, imaging obtained, face-to-face time discussing all options regarding treatment and documenting the encounter.   Follow-up: No follow-ups on file.   Meds & Orders:  Meds ordered this encounter  Medications   amoxicillin -clavulanate (AUGMENTIN ) 500-125 MG tablet    Sig: Take 1 tablet by mouth 3 (three) times daily.    Dispense:  21 tablet    Refill:  0   No orders of the defined types were placed in this encounter.    Procedures: No procedures performed      Clinical History: No specialty comments available.  She reports that she quit smoking about 18 years ago. Her smoking use included cigarettes. She started smoking about 38 years ago. She has a 5 pack-year smoking history. She has been exposed to tobacco smoke. She has never used smokeless tobacco. No results for input(s): HGBA1C, LABURIC in the last 8760 hours.  Objective:   Vital Signs: There were no vitals taken for this visit.  Physical Exam  Gen: Well-appearing, in no acute distress; non-toxic CV: Regular Rate. Well-perfused. Warm.  Resp: Breathing unlabored on room air; no wheezing. Psych: Fluid speech in conversation; appropriate affect; normal thought process  Ortho Exam Right hand: - Notable dog bite laceration injury over the dorsal aspect of the hand, measures approximately 6 cm in length transversely at the proximal aspect, just distal to the wrist crease, there is extension vertically over the radial aspect of the dorsum of the hand, skin edges are well-approximated with sutures in place, no evidence of ongoing erythema - There is mild discoloration of the skin flap at the proximal aspect, there  is appropriate skin turgor currently - No significant erythema or streaking up the forearm, sensation is intact distally, normal color capillary refill to the digits   Imaging: No results found.  Past Medical/Family/Surgical/Social History: Medications & Allergies reviewed per EMR, new medications  updated. Patient Active Problem List   Diagnosis Date Noted   Generalized osteoarthritis of multiple sites 10/05/2023   Chronic pain syndrome 10/05/2023   Enthesitis 04/21/2023   Left lower quadrant abdominal pain 04/03/2023   Pain in left knee 10/24/2022   Other insomnia 10/17/2022   Positive ANA (antinuclear antibody) 09/30/2022   Fall 08/05/2022   Skin lesion 08/05/2022   Obesity (BMI 30-39.9) 08/05/2022   Vitamin D  deficiency 08/05/2022   Depression, major, single episode, mild 05/19/2022   Multiple joint pain 05/19/2022   Lateral epicondylitis, left elbow 08/22/2021   Chronic venous insufficiency 08/02/2021   Left arm pain 06/05/2021   Lumbar pain 05/15/2021   Left elbow pain 05/15/2021   Preventative health care 05/15/2021   Varicose veins of both lower extremities with pain 05/15/2021   Hiatal hernia 05/02/2020   History of malignant carcinoid tumor of stomach 05/02/2020   B12 deficiency 10/20/2019   History of gastric surgery 10/20/2019   Hypothyroidism due to Hashimoto's thyroiditis 10/20/2019   Moderate persistent asthma without complication 10/20/2019   COVID-19 03/17/2019   Past Medical History:  Diagnosis Date   Allergy    Asthma    Blood transfusion without reported diagnosis    Carcinoid tumor of colon    Followed by Dr. Anil Tumbarpurra in [place]   Chicken pox    Constipation    Diverticulitis    Diverticulosis    Hypertension    Thyroid  disease    hoshimotos's thyroiditis at age 24   Family History  Problem Relation Age of Onset   Heart disease Father    Hyperlipidemia Father    Other Father        Heart transplant   Diabetes Brother    Heart disease Brother    Testicular cancer Brother    Heart disease Brother    Heart attack Brother    Past Surgical History:  Procedure Laterality Date   ABDOMINAL HYSTERECTOMY     states took everything but cervix?   DG GALL BLADDER     KNEE SURGERY Left 2025   LAPAROSCOPIC GASTRIC SLEEVE RESECTION      TENNIS ELBOW RELEASE/NIRSCHEL PROCEDURE Left 01/24/2022   Procedure: LEFT LATERAL EPICONDYLE DEBRIDEMENT, REPAIR AS INIDICATED;  Surgeon: Jerri Kay HERO, MD;  Location: [facility];  Service: Orthopedics;  Laterality: Left;   THYROIDECTOMY     Social History   Occupational History   Not on file  Tobacco Use   Smoking status: Former    Current packs/day: 0.00    Average packs/day: 0.3 packs/day for 20.0 years (5.0 ttl pk-yrs)    Types: Cigarettes    Start date: 01/20/1985    Quit date: 01/20/2005    Years since quitting: 18.9    Passive exposure: Past   Smokeless tobacco: Never  Vaping Use   Vaping status: Never Used  Substance and Sexual Activity   Alcohol use: Yes    Alcohol/week: 6.0 standard drinks of alcohol    Types: 6 Glasses of wine per week   Drug use: Never   Sexual activity: Not Currently    Valinda Fedie Estela) Arlinda, M.D. [facility], Hand Surgery

## 2023-12-21 ENCOUNTER — Ambulatory Visit: Admitting: Orthopedic Surgery

## 2023-12-21 DIAGNOSIS — W540XXA Bitten by dog, initial encounter: Secondary | ICD-10-CM

## 2023-12-21 DIAGNOSIS — S61451A Open bite of right hand, initial encounter: Secondary | ICD-10-CM | POA: Diagnosis not present

## 2023-12-21 MED ORDER — AMOXICILLIN-POT CLAVULANATE 500-125 MG PO TABS: 1.0000 | ORAL_TABLET | Freq: Three times a day (TID) | ORAL | 0 refills | Status: DC

## 2023-12-28 ENCOUNTER — Other Ambulatory Visit: Payer: Self-pay | Admitting: Orthopedic Surgery

## 2023-12-28 ENCOUNTER — Ambulatory Visit: Admitting: Orthopedic Surgery

## 2023-12-28 DIAGNOSIS — S61451A Open bite of right hand, initial encounter: Secondary | ICD-10-CM

## 2023-12-28 MED ORDER — TRAMADOL HCL 50 MG PO TABS: 50.0000 mg | ORAL_TABLET | Freq: Four times a day (QID) | ORAL | 0 refills | Status: AC | PRN

## 2023-12-28 MED ORDER — AMOXICILLIN-POT CLAVULANATE 875-125 MG PO TABS: 1.0000 | ORAL_TABLET | Freq: Two times a day (BID) | ORAL | 0 refills | Status: AC

## 2023-12-28 NOTE — Progress Notes (Signed)
 Mrytle Bento - 63 y.o. female MRN [id number]  Date of birth: Jun 27, 1960  Office Visit Note: Visit Date: 12/28/2023 PCP: Wendee Lynwood HERO, NP Referred by: Wendee Lynwood HERO, NP  Subjective: No chief complaint on file.  HPI: Autymn Omlor is a pleasant 63 y.o. female who presents today for evaluation of a right hand dog bite injury sustained 2 week prior.  She was seen in the emergency department setting the day of injury, underwent bedside irrigation and closure.  There was notable flap creation over the dorsal aspect of the right hand with discoloration.  Has been compliant with basic wound care since that time, has been taking oral antibiotics as instructed.  Having ongoing pain in this region, no systemic symptoms.  Pertinent ROS were reviewed with the patient and found to be negative unless otherwise specified above in HPI.    Assessment & Plan: Visit Diagnoses:  1. Dog bite, hand, right, initial encounter      Plan: Based on clinical examination today, she is demonstrating appropriate ongoing healing of the right hand dog bite injury.  As previously seen in the emergency department setting, there is notable skin flap elevation of the dorsal aspect of the hand.  This does remain with vascularity currently, no evidence of ongoing skin necrosis.  There is some ongoing erythema in this region without active drainage.  New course of antibiotics was prescribed today.  Follow-up in 3 days for repeat check.  We did discuss the possibility of formalized operative irrigation and debridement in this region should symptoms continue to persist.    Follow-up: No follow-ups on file.   Meds & Orders:  No orders of the defined types were placed in this encounter.  No orders of the defined types were placed in this encounter.    Procedures: No procedures performed      Clinical History: No specialty comments available.  She reports that she quit smoking about 18 years ago. Her smoking use  included cigarettes. She started smoking about 38 years ago. She has a 5 pack-year smoking history. She has been exposed to tobacco smoke. She has never used smokeless tobacco. No results for input(s): HGBA1C, LABURIC in the last 8760 hours.  Objective:   Vital Signs: There were no vitals taken for this visit.  Physical Exam  Gen: Well-appearing, in no acute distress; non-toxic CV: Regular Rate. Well-perfused. Warm.  Resp: Breathing unlabored on room air; no wheezing. Psych: Fluid speech in conversation; appropriate affect; normal thought process  Ortho Exam Right hand: - Notable dog bite laceration injury over the dorsal aspect of the hand, measures approximately 6 cm in length transversely at the proximal aspect, just distal to the wrist crease, there is extension vertically over the radial aspect of the dorsum of the hand, skin edges are well-approximated with sutures in place, no evidence of ongoing erythema - There is mild discoloration of the skin flap at the proximal aspect, there is appropriate skin turgor currently - No significant erythema or streaking up the forearm, sensation is intact distally, normal color capillary refill to the digits   Imaging: No results found.  Past Medical/Family/Surgical/Social History: Medications & Allergies reviewed per EMR, new medications updated. Patient Active Problem List   Diagnosis Date Noted   Generalized osteoarthritis of multiple sites 10/05/2023   Chronic pain syndrome 10/05/2023   Enthesitis 04/21/2023   Left lower quadrant abdominal pain 04/03/2023   Pain in left knee 10/24/2022   Other insomnia 10/17/2022   Positive ANA (antinuclear  antibody) 09/30/2022   Fall 08/05/2022   Skin lesion 08/05/2022   Obesity (BMI 30-39.9) 08/05/2022   Vitamin D  deficiency 08/05/2022   Depression, major, single episode, mild 05/19/2022   Multiple joint pain 05/19/2022   Lateral epicondylitis, left elbow 08/22/2021   Chronic venous  insufficiency 08/02/2021   Left arm pain 06/05/2021   Lumbar pain 05/15/2021   Left elbow pain 05/15/2021   Preventative health care 05/15/2021   Varicose veins of both lower extremities with pain 05/15/2021   Hiatal hernia 05/02/2020   History of malignant carcinoid tumor of stomach 05/02/2020   B12 deficiency 10/20/2019   History of gastric surgery 10/20/2019   Hypothyroidism due to Hashimoto's thyroiditis 10/20/2019   Moderate persistent asthma without complication 10/20/2019   COVID-19 03/17/2019   Past Medical History:  Diagnosis Date   Allergy    Asthma    Blood transfusion without reported diagnosis    Carcinoid tumor of colon    Followed by Dr. Anil Tumbarpurra in [place]   Chicken pox    Constipation    Diverticulitis    Diverticulosis    Hypertension    Thyroid  disease    hoshimotos's thyroiditis at age 52   Family History  Problem Relation Age of Onset   Heart disease Father    Hyperlipidemia Father    Other Father        Heart transplant   Diabetes Brother    Heart disease Brother    Testicular cancer Brother    Heart disease Brother    Heart attack Brother    Past Surgical History:  Procedure Laterality Date   ABDOMINAL HYSTERECTOMY     states took everything but cervix?   DG GALL BLADDER     KNEE SURGERY Left 2025   LAPAROSCOPIC GASTRIC SLEEVE RESECTION     TENNIS ELBOW RELEASE/NIRSCHEL PROCEDURE Left 01/24/2022   Procedure: LEFT LATERAL EPICONDYLE DEBRIDEMENT, REPAIR AS INIDICATED;  Surgeon: Jerri Kay HERO, MD;  Location: [facility];  Service: Orthopedics;  Laterality: Left;   THYROIDECTOMY     Social History   Occupational History   Not on file  Tobacco Use   Smoking status: Former    Current packs/day: 0.00    Average packs/day: 0.3 packs/day for 20.0 years (5.0 ttl pk-yrs)    Types: Cigarettes    Start date: 01/20/1985    Quit date: 01/20/2005    Years since quitting: 18.9    Passive exposure: Past   Smokeless tobacco: Never   Vaping Use   Vaping status: Never Used  Substance and Sexual Activity   Alcohol use: Yes    Alcohol/week: 6.0 standard drinks of alcohol    Types: 6 Glasses of wine per week   Drug use: Never   Sexual activity: Not Currently    Jaquanda Wickersham Estela) Arlinda, M.D. [facility], Hand Surgery

## 2023-12-28 NOTE — Progress Notes (Deleted)
 Office Visit Note  Patient: Debra Adkins             Date of Birth: 1960-04-16           MRN: [id number]             PCP: Wendee Lynwood HERO, NP Referring: Wendee Lynwood HERO, NP Visit Date: 01/01/2024   Subjective:  No chief complaint on file.   History of Present Illness: Debra Adkins is a 63 y.o. female here for follow up with joint pain and headaches, abdominal pain, and diarrhea.   Previous HPI 10/05/2023 Debra Adkins is a 63 y.o. female here for follow up  with joint pain and headaches, abdominal pain, and diarrhea. We had prescribed HCQ for suspected inflammatory arthritis due to positive ANA and continued symptoms.   Approximately two weeks ago, she experienced profuse sweating while lying in bed, followed by jaw tightness and pain. Antacids provided some relief. The following morning, similar symptoms occurred while sitting, including sweating, nausea, and feeling 'real sweaty headed.' She went to the emergency room where lab tests showed abnormal platelet and calcium levels, and slightly elevated hemoglobin.  She has history of carcinoid syndrome.   She has been experiencing skin issues, including red, blistered ears after sun exposure and clear, water-filled blisters on her face and forehead. These symptoms began around the same time as her emergency room visit. She also reports new onset headaches and nausea, for which she has been taking Phenergan .   She has been taking hydroxychloroquine  for the past two weeks, initially daily and now every other day, which she feels is helping slightly with her symptoms. She also takes Tylenol  as needed for pain.   She describes numbness in her toe and has been experiencing swelling in her hands and pain in her legs, which limits her ability to walk and perform daily activities. She has been trying to manage the pain with stretches and Tylenol .   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         Previous HPI 07/09/2023 Debra Adkins is a 63 y.o. female here for follow up with joint pain and headaches, abdominal pain, and diarrhea. She had interval evaluation with EGD and colonoscopy with [facility] with findings for  carcinoid tumor and gastritis who presents with ongoing gastrointestinal symptoms and joint pain.   She experiences persistent gastrointestinal issues, including lower abdominal pain. An endoscopy revealed gastritis-related changes. She is scheduled for a 24-hour urine test to evaluate carcinoid tumor activity. Flushing symptoms are present, and steroid treatment provided minimal relief for abdominal pain but caused sleep disturbances. She uses sleep aids reluctantly.   She reports widespread bruising and swelling, particularly around the site of her previous thyroidectomy, describing the swelling as tender. Fatigue and lack of energy are noted, along with facial flushing. She experiences frequent sweating, even in cool environments, and a sensation of her legs falling asleep with associated pain.   Joint pain is persistent and impacts daily life. A history of a positive ANA test is noted. Swelling is present in her fingers and toes, but without discoloration.     Previous HPI 04/21/2023 Debra Adkins is a 64 year old female who presents with joint pain and headaches. She is accompanied by her husband.   She experiences joint pain in multiple areas, which has worsened following recent surgery. She has not  initiated any new medications due to concerns about gastrointestinal health, especially after a recent colonoscopy confirmed diverticulitis. She reports significant morning stiffness lasting a couple of hours, difficulty with mobility, and weight gain due to reduced activity.   She underwent a meniscus repair, which took a long time to heal. She reports persistent issues with fluid accumulation in  the knee, which was aspirated, and a scaly area that did not heal until she applied clobetasol . The knee remains painful, especially when descending stairs.   She describes experiencing headaches as a new symptom, located across her head and accompanied by a sensation of heat and sweating. She also reports balance issues and short-term memory problems, which her husband has noticed.   She reports chronic diarrhea for the past six months, which has been persistent and problematic.   She has a history of carcinoid tumors and reports flushing episodes, though these feel different from previous episodes. No recent breathing difficulties and she has not used her inhaler for some time.   She describes pain in her heel, which has been present for about two months. The pain is persistent, sometimes throbbing, and is exacerbated by pressure. It is particularly painful at night.       Previous HPI 10/24/2022 Debra Adkins is a 63 y.o. female here for follow up for chronic joint pain and rashes with positive ANA.  Our initial exam was mostly unremarkable and lab test for systemic inflammation were all normal.  She is back today for follow-up to review labs also due to new onset of joint pain and swelling in her left knee.  This just started after standing up from a chair after a few steps felt a popping sensation in the knee and then the sensation that the joint was going to give way.  Since then has severe pain with pressure and with weightbearing on the medial side of the joint.   Previous HPI 09/30/22 Debra Adkins is a 63 y.o. female here for evaluation of positive ANA associated with joint pains fatigue sensitivity and rashes.  Medical history is also significant for hypothyroidism due to resection for Hashimoto's thyroiditis, gastric surgery for carcinoid tumor, moderate persistent asthma, and chronic venous insufficiency. She notices new and increased symptoms during the past 6 to 12 months.  Did not  recall any specific event or medical change.  Main thing in the past few years had been a few episodes of COVID though none with severe complications.  She did sustain a fall landing on her left elbow in 2022 with associated left arm pain.  She had x-rays of the low back and the elbow last year with mild degenerative changes but not particularly significant compared to symptoms.  Also had ultrasound exam ruling out upper extremity DVT due to persistent swelling.  Subsequent elbow MRI consistent with tendinosis or partial thickness tear with associated edema.  Besides this her main joint pain and she has been in the back and at the hips.  This is limiting her walking ability previously was very active walking 5 dogs now cannot tolerate a lot due to pain in her hip also with pain and numbness in her toes.  She feels the left hip gets sharp pain anteriorly that comes and goes.  Also gets lateral hip pain bothers him trying to sleep.  Pain throughout her back from upper to lower.  She is also been experiencing a sensitivity to light touch and frequent burning painful sensation in her skin mostly in her torso  and proximal upper body.  She was prescribed Flexeril  for muscle spasms in June not currently on any other prescription medication for musculoskeletal or nerve pain. Evaluation in primary care office in April showed a high TSH and low vitamin D  these were corrected by follow-up in July.  Also takes B12 supplementation. Has noticed skin rash with redness at the front of the neck and sometimes across the bridge of the nose and central face.  Most often sees this after waking up individual episodes lasting minutes or up to a few hours.  Does not particularly associated with sun exposure. She notices hair and fingernail thinning and brittleness.  Has dry eyes and mouth 1 recent sore on the inside of right cheek. Fingers and toes often cold or numb without visible discoloration. Sleep quality is very poor describes  several hours trying to fall asleep each night despite feeling excessively tired.  Has never had a sleep study has been told that she snores this was apparently worse when she weighed more. She does report short term memory problems and poor concentration. She has diarrhea chronically. Some episodes of dizziness not having headaches.   Labs reviewed ANA 1:80 homogenous RF neg CCP neg MCV neg   No Rheumatology ROS completed.   PMFS History:  Patient Active Problem List   Diagnosis Date Noted   Generalized osteoarthritis of multiple sites 10/05/2023   Chronic pain syndrome 10/05/2023   Enthesitis 04/21/2023   Left lower quadrant abdominal pain 04/03/2023   Pain in left knee 10/24/2022   Other insomnia 10/17/2022   Positive ANA (antinuclear antibody) 09/30/2022   Fall 08/05/2022   Skin lesion 08/05/2022   Obesity (BMI 30-39.9) 08/05/2022   Vitamin D  deficiency 08/05/2022   Depression, major, single episode, mild 05/19/2022   Multiple joint pain 05/19/2022   Lateral epicondylitis, left elbow 08/22/2021   Chronic venous insufficiency 08/02/2021   Left arm pain 06/05/2021   Lumbar pain 05/15/2021   Left elbow pain 05/15/2021   Preventative health care 05/15/2021   Varicose veins of both lower extremities with pain 05/15/2021   Hiatal hernia 05/02/2020   History of malignant carcinoid tumor of stomach 05/02/2020   B12 deficiency 10/20/2019   History of gastric surgery 10/20/2019   Hypothyroidism due to Hashimoto's thyroiditis 10/20/2019   Moderate persistent asthma without complication 10/20/2019   COVID-19 03/17/2019    Past Medical History:  Diagnosis Date   Allergy    Asthma    Blood transfusion without reported diagnosis    Carcinoid tumor of colon    Followed by Dr. Anil Tumbarpurra in [place]   Chicken pox    Constipation    Diverticulitis    Diverticulosis    Hypertension    Thyroid  disease    hoshimotos's thyroiditis at age 19    Family History  Problem  Relation Age of Onset   Heart disease Father    Hyperlipidemia Father    Other Father        Heart transplant   Diabetes Brother    Heart disease Brother    Testicular cancer Brother    Heart disease Brother    Heart attack Brother    Past Surgical History:  Procedure Laterality Date   ABDOMINAL HYSTERECTOMY     states took everything but cervix?   DG GALL BLADDER     KNEE SURGERY Left 2025   LAPAROSCOPIC GASTRIC SLEEVE RESECTION     TENNIS ELBOW RELEASE/NIRSCHEL PROCEDURE Left 01/24/2022   Procedure: LEFT LATERAL EPICONDYLE DEBRIDEMENT,  REPAIR AS INIDICATED;  Surgeon: Jerri Kay HERO, MD;  Location: [facility];  Service: Orthopedics;  Laterality: Left;   THYROIDECTOMY     Social History   Social History Narrative   05/02/20   From: [place] , but moved when she was high school   Living: with Eloy, husband (2019) but together since 2005   Work: not currently due to health issues      Family: brother in [place]      Enjoys: rescue dogs - has 5      Exercise: not currently due to joint issues   Diet: low calorie, salads      Safety   Seat belts: Yes    Guns: Yes  and secure   Safe in relationships: Yes    Immunization History  Administered Date(s) Administered   Tdap 08/05/2022     Objective: Vital Signs: There were no vitals taken for this visit.   Physical Exam   Musculoskeletal Exam: ***  CDAI Exam: CDAI Score: -- Patient Global: --; Provider Global: -- Swollen: --; Tender: -- Joint Exam 01/01/2024   No joint exam has been documented for this visit   There is currently no information documented on the homunculus. Go to the Rheumatology activity and complete the homunculus joint exam.  Investigation: No additional findings.  Imaging: DG Hand Complete Right Result Date: 12/14/2023 EXAM: 3 OR MORE VIEW(S) XRAY OF THE HAND 12/14/2023 06:57:00 PM COMPARISON: 04/24/2023 CLINICAL HISTORY: Recent dog bite. FINDINGS: BONES AND JOINTS: A stable  metallic foreign body is noted in the distal aspect of the 2nd digit. No acute fracture. No joint dislocation. SOFT TISSUES: Air is noted in the subcutaneous tissues along the dorsum of the hand, consistent with the recent dog bite. IMPRESSION: 1. Air in the subcutaneous tissues along the dorsum of the hand, consistent with a recent dog bite. 2. No acute osseous abnormality. 3. Stable metallic foreign body in the distal aspect of the 2nd digit. Electronically signed by: Oneil Devonshire MD 12/14/2023 07:47 PM EST RP Workstation: [id number]    Recent Labs: Lab Results  Component Value Date   WBC 8.3 09/24/2023   HGB 15.1 (H) 09/24/2023   PLT 212 09/24/2023   NA 141 09/24/2023   K 3.7 09/24/2023   CL 102 09/24/2023   CO2 24 09/24/2023   GLUCOSE 94 09/24/2023   BUN 12 09/24/2023   CREATININE 0.73 09/24/2023   BILITOT 0.6 04/03/2023   ALKPHOS 64 04/03/2023   AST 17 04/03/2023   ALT 19 04/03/2023   PROT 7.5 04/03/2023   ALBUMIN 4.7 04/03/2023   CALCIUM 10.6 (H) 09/24/2023    Speciality Comments: Patient would like 90 day refills when able, instead of 30 with 2 refills  Procedures:  No procedures performed Allergies: Chocolate and Codeine   Assessment / Plan:     Visit Diagnoses: No diagnosis found.  ***  Orders: No orders of the defined types were placed in this encounter.  No orders of the defined types were placed in this encounter.    Follow-Up Instructions: No follow-ups on file.   Amiel Mccaffrey M Gearldean Lomanto, CMA  Note - This record has been created using Animal nutritionist.  Chart creation errors have been sought, but may not always  have been located. Such creation errors do not reflect on  the standard of medical care.

## 2023-12-30 NOTE — Progress Notes (Signed)
 Wound check; dog bite Having some ongoing erythema at this region with associated pain, no active drainage.  Has been taking antibiotics as instructed  Extensive discussion was had with the patient today regarding the ongoing healing of the dog bite region of the right hand.  Sutures will remain in for additional few days to allow for further healing.  We did once again discussed the significant elevation of the skin flap over the dorsal aspect of the region of the hand, this may require extensive granulation beneath as time goes along.  Continue with antibiotics as instructed, follow-up in next week for wound check and likely suture removal.  Bain Whichard, MD [facility] [place]

## 2023-12-31 ENCOUNTER — Ambulatory Visit: Admitting: Orthopedic Surgery

## 2023-12-31 DIAGNOSIS — S61451A Open bite of right hand, initial encounter: Secondary | ICD-10-CM

## 2023-12-31 DIAGNOSIS — W540XXA Bitten by dog, initial encounter: Secondary | ICD-10-CM

## 2024-01-01 ENCOUNTER — Ambulatory Visit: Admitting: Internal Medicine

## 2024-01-01 DIAGNOSIS — M159 Polyosteoarthritis, unspecified: Secondary | ICD-10-CM

## 2024-01-01 DIAGNOSIS — R7689 Other specified abnormal immunological findings in serum: Secondary | ICD-10-CM

## 2024-01-04 ENCOUNTER — Ambulatory Visit: Admitting: Internal Medicine

## 2024-01-05 ENCOUNTER — Other Ambulatory Visit: Payer: Self-pay | Admitting: Nurse Practitioner

## 2024-01-05 ENCOUNTER — Ambulatory Visit: Admitting: Orthopedic Surgery

## 2024-01-05 DIAGNOSIS — S61451A Open bite of right hand, initial encounter: Secondary | ICD-10-CM

## 2024-01-05 DIAGNOSIS — F320 Major depressive disorder, single episode, mild: Secondary | ICD-10-CM

## 2024-01-05 NOTE — Progress Notes (Signed)
 Wound check; dog bite Erythema is improving, granulation tissue is evident at the dorsal aspect of the hand without active drainage or fluid collection  Extensive discussion was had with the patient today regarding the ongoing healing of the dog bite region of the right hand.  Sutures are out at this point.  Complete antibiotic regimen.  Continue with standard wound care.  She can follow-up in 2 to 3 weeks.  She is welcome to send me pictures in the chart over the course of the next week once she is off the antibiotics to ensure ongoing progress.  Latif Nazareno, MD [facility] [place]

## 2024-01-05 NOTE — Telephone Encounter (Signed)
 Needs CPE in 30 days to continue getting refills

## 2024-01-07 NOTE — Telephone Encounter (Signed)
 Pt will call office back when husband comes home tomorrow

## 2024-01-10 ENCOUNTER — Other Ambulatory Visit: Payer: Self-pay | Admitting: Internal Medicine

## 2024-01-10 DIAGNOSIS — G894 Chronic pain syndrome: Secondary | ICD-10-CM

## 2024-01-10 DIAGNOSIS — M159 Polyosteoarthritis, unspecified: Secondary | ICD-10-CM

## 2024-01-11 NOTE — Telephone Encounter (Signed)
 Last Fill: 10/05/2023  Next Visit: 03/08/2024  Last Visit: 10/05/2023  Dx: Generalized osteoarthritis of multiple site   Current Dose per office note on 10/05/2023: flexeril  5 mg QHS   Okay to refill Flexeril ?

## 2024-02-01 ENCOUNTER — Other Ambulatory Visit: Payer: Self-pay | Admitting: Nurse Practitioner

## 2024-02-01 DIAGNOSIS — F320 Major depressive disorder, single episode, mild: Secondary | ICD-10-CM

## 2024-02-05 ENCOUNTER — Other Ambulatory Visit: Payer: Self-pay | Admitting: Nurse Practitioner

## 2024-02-05 DIAGNOSIS — F320 Major depressive disorder, single episode, mild: Secondary | ICD-10-CM

## 2024-02-24 ENCOUNTER — Ambulatory Visit: Admitting: Orthopedic Surgery

## 2024-02-25 NOTE — Assessment & Plan Note (Signed)
 Debra Adkins

## 2024-02-25 NOTE — Progress Notes (Unsigned)
 "  Office Visit Note  Patient: Debra Adkins             Date of Birth: 06-Jun-1960           MRN: [id number]             PCP: Wendee Lynwood HERO, NP Referring: Wendee Lynwood HERO, NP Visit Date: 03/08/2024   Subjective:  No chief complaint on file.   History of Present Illness: Debra Adkins is a 64 y.o. female here for follow up  with joint pain and headaches, abdominal pain, and diarrhea.   Previous HPI 10/05/2023 Debra Adkins is a 64 y.o. female here for follow up  with joint pain and headaches, abdominal pain, and diarrhea. We had prescribed HCQ for suspected inflammatory arthritis due to positive ANA and continued symptoms.   Approximately two weeks ago, she experienced profuse sweating while lying in bed, followed by jaw tightness and pain. Antacids provided some relief. The following morning, similar symptoms occurred while sitting, including sweating, nausea, and feeling 'real sweaty headed.' She went to the emergency room where lab tests showed abnormal platelet and calcium levels, and slightly elevated hemoglobin.  She has history of carcinoid syndrome.   She has been experiencing skin issues, including red, blistered ears after sun exposure and clear, water-filled blisters on her face and forehead. These symptoms began around the same time as her emergency room visit. She also reports new onset headaches and nausea, for which she has been taking Phenergan .   She has been taking hydroxychloroquine  for the past two weeks, initially daily and now every other day, which she feels is helping slightly with her symptoms. She also takes Tylenol  as needed for pain.   She describes numbness in her toe and has been experiencing swelling in her hands and pain in her legs, which limits her ability to walk and perform daily activities. She has been trying to manage the pain with stretches and Tylenol .   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         Previous HPI 07/09/2023 Debra Adkins is a 64 y.o. female here for follow up with joint pain and headaches, abdominal pain, and diarrhea. She had interval evaluation with EGD and colonoscopy with [facility] with findings for  carcinoid tumor and gastritis who presents with ongoing gastrointestinal symptoms and joint pain.   She experiences persistent gastrointestinal issues, including lower abdominal pain. An endoscopy revealed gastritis-related changes. She is scheduled for a 24-hour urine test to evaluate carcinoid tumor activity. Flushing symptoms are present, and steroid treatment provided minimal relief for abdominal pain but caused sleep disturbances. She uses sleep aids reluctantly.   She reports widespread bruising and swelling, particularly around the site of her previous thyroidectomy, describing the swelling as tender. Fatigue and lack of energy are noted, along with facial flushing. She experiences frequent sweating, even in cool environments, and a sensation of her legs falling asleep with associated pain.   Joint pain is persistent and impacts daily life. A history of a positive ANA test is noted. Swelling is present in her fingers and toes, but without discoloration.     Previous HPI 04/21/2023 Debra Adkins is a 64 year old female who presents with joint pain and headaches. She is accompanied by her husband.   She experiences joint pain in multiple areas, which has worsened following recent surgery. She  has not initiated any new medications due to concerns about gastrointestinal health, especially after a recent colonoscopy confirmed diverticulitis. She reports significant morning stiffness lasting a couple of hours, difficulty with mobility, and weight gain due to reduced activity.   She underwent a meniscus repair, which took a long time to heal. She reports persistent issues with fluid accumulation in  the knee, which was aspirated, and a scaly area that did not heal until she applied clobetasol . The knee remains painful, especially when descending stairs.   She describes experiencing headaches as a new symptom, located across her head and accompanied by a sensation of heat and sweating. She also reports balance issues and short-term memory problems, which her husband has noticed.   She reports chronic diarrhea for the past six months, which has been persistent and problematic.   She has a history of carcinoid tumors and reports flushing episodes, though these feel different from previous episodes. No recent breathing difficulties and she has not used her inhaler for some time.   She describes pain in her heel, which has been present for about two months. The pain is persistent, sometimes throbbing, and is exacerbated by pressure. It is particularly painful at night.       Previous HPI 10/24/2022 Debra Adkins is a 64 y.o. female here for follow up for chronic joint pain and rashes with positive ANA.  Our initial exam was mostly unremarkable and lab test for systemic inflammation were all normal.  She is back today for follow-up to review labs also due to new onset of joint pain and swelling in her left knee.  This just started after standing up from a chair after a few steps felt a popping sensation in the knee and then the sensation that the joint was going to give way.  Since then has severe pain with pressure and with weightbearing on the medial side of the joint.   Previous HPI 09/30/22 Debra Adkins is a 64 y.o. female here for evaluation of positive ANA associated with joint pains fatigue sensitivity and rashes.  Medical history is also significant for hypothyroidism due to resection for Hashimoto's thyroiditis, gastric surgery for carcinoid tumor, moderate persistent asthma, and chronic venous insufficiency. She notices new and increased symptoms during the past 6 to 12 months.  Did not  recall any specific event or medical change.  Main thing in the past few years had been a few episodes of COVID though none with severe complications.  She did sustain a fall landing on her left elbow in 2022 with associated left arm pain.  She had x-rays of the low back and the elbow last year with mild degenerative changes but not particularly significant compared to symptoms.  Also had ultrasound exam ruling out upper extremity DVT due to persistent swelling.  Subsequent elbow MRI consistent with tendinosis or partial thickness tear with associated edema.  Besides this her main joint pain and she has been in the back and at the hips.  This is limiting her walking ability previously was very active walking 5 dogs now cannot tolerate a lot due to pain in her hip also with pain and numbness in her toes.  She feels the left hip gets sharp pain anteriorly that comes and goes.  Also gets lateral hip pain bothers him trying to sleep.  Pain throughout her back from upper to lower.  She is also been experiencing a sensitivity to light touch and frequent burning painful sensation in her skin mostly in  her torso and proximal upper body.  She was prescribed Flexeril  for muscle spasms in June not currently on any other prescription medication for musculoskeletal or nerve pain. Evaluation in primary care office in April showed a high TSH and low vitamin D  these were corrected by follow-up in July.  Also takes B12 supplementation. Has noticed skin rash with redness at the front of the neck and sometimes across the bridge of the nose and central face.  Most often sees this after waking up individual episodes lasting minutes or up to a few hours.  Does not particularly associated with sun exposure. She notices hair and fingernail thinning and brittleness.  Has dry eyes and mouth 1 recent sore on the inside of right cheek. Fingers and toes often cold or numb without visible discoloration. Sleep quality is very poor describes  several hours trying to fall asleep each night despite feeling excessively tired.  Has never had a sleep study has been told that she snores this was apparently worse when she weighed more. She does report short term memory problems and poor concentration. She has diarrhea chronically. Some episodes of dizziness not having headaches.   No Rheumatology ROS completed.   PMFS History:  Patient Active Problem List   Diagnosis Date Noted   Generalized osteoarthritis of multiple sites 10/05/2023   Chronic pain syndrome 10/05/2023   Enthesitis 04/21/2023   Left lower quadrant abdominal pain 04/03/2023   Pain in left knee 10/24/2022   Other insomnia 10/17/2022   Positive ANA (antinuclear antibody) 09/30/2022   Fall 08/05/2022   Skin lesion 08/05/2022   Obesity (BMI 30-39.9) 08/05/2022   Vitamin D  deficiency 08/05/2022   Depression, major, single episode, mild 05/19/2022   Multiple joint pain 05/19/2022   Lateral epicondylitis, left elbow 08/22/2021   Chronic venous insufficiency 08/02/2021   Left arm pain 06/05/2021   Lumbar pain 05/15/2021   Left elbow pain 05/15/2021   Preventative health care 05/15/2021   Varicose veins of both lower extremities with pain 05/15/2021   Hiatal hernia 05/02/2020   History of malignant carcinoid tumor of stomach 05/02/2020   B12 deficiency 10/20/2019   History of gastric surgery 10/20/2019   Hypothyroidism due to Hashimoto's thyroiditis 10/20/2019   Moderate persistent asthma without complication 10/20/2019   COVID-19 03/17/2019    Past Medical History:  Diagnosis Date   Allergy    Asthma    Blood transfusion without reported diagnosis    Carcinoid tumor of colon    Followed by Dr. Anil Tumbarpurra in [place]   Chicken pox    Constipation    Diverticulitis    Diverticulosis    Hypertension    Thyroid  disease    hoshimotos's thyroiditis at age 30    Family History  Problem Relation Age of Onset   Heart disease Father    Hyperlipidemia  Father    Other Father        Heart transplant   Diabetes Brother    Heart disease Brother    Testicular cancer Brother    Heart disease Brother    Heart attack Brother    Past Surgical History:  Procedure Laterality Date   ABDOMINAL HYSTERECTOMY     states took everything but cervix?   DG GALL BLADDER     KNEE SURGERY Left 2025   LAPAROSCOPIC GASTRIC SLEEVE RESECTION     TENNIS ELBOW RELEASE/NIRSCHEL PROCEDURE Left 01/24/2022   Procedure: LEFT LATERAL EPICONDYLE DEBRIDEMENT, REPAIR AS INIDICATED;  Surgeon: Jerri Kay HERO, MD;  Location:  [facility];  Service: Orthopedics;  Laterality: Left;   THYROIDECTOMY     Social History   Social History Narrative   05/02/20   From: [place] , but moved when she was high school   Living: with Eloy, husband (2019) but together since 2005   Work: not currently due to health issues      Family: brother in [place]      Enjoys: rescue dogs - has 5      Exercise: not currently due to joint issues   Diet: low calorie, salads      Safety   Seat belts: Yes    Guns: Yes  and secure   Safe in relationships: Yes    Immunization History  Administered Date(s) Administered   Tdap 08/05/2022     Objective: Vital Signs: There were no vitals taken for this visit.   Physical Exam   Musculoskeletal Exam: ***   Investigation: No additional findings.  Imaging: No results found.  Recent Labs: Lab Results  Component Value Date   WBC 8.3 09/24/2023   HGB 15.1 (H) 09/24/2023   PLT 212 09/24/2023   NA 141 09/24/2023   K 3.7 09/24/2023   CL 102 09/24/2023   CO2 24 09/24/2023   GLUCOSE 94 09/24/2023   BUN 12 09/24/2023   CREATININE 0.73 09/24/2023   BILITOT 0.6 04/03/2023   ALKPHOS 64 04/03/2023   AST 17 04/03/2023   ALT 19 04/03/2023   PROT 7.5 04/03/2023   ALBUMIN 4.7 04/03/2023   CALCIUM 10.6 (H) 09/24/2023    Speciality Comments: Patient would like 90 day refills when able, instead of 30 with 2  refills  Procedures:  No procedures performed Allergies: Chocolate and Codeine   Assessment / Plan:     Visit Diagnoses:  Assessment & Plan Generalized osteoarthritis of multiple sites     Positive ANA (antinuclear antibody)      ***  Follow-Up Instructions: No follow-ups on file.   Romello Hoehn M Rosevelt Luu, CMA  Note - This record has been created using Animal nutritionist.  Chart creation errors have been sought, but may not always  have been located. Such creation errors do not reflect on  the standard of medical care. "

## 2024-03-08 ENCOUNTER — Ambulatory Visit: Admitting: Internal Medicine

## 2024-03-08 DIAGNOSIS — M159 Polyosteoarthritis, unspecified: Secondary | ICD-10-CM

## 2024-03-08 DIAGNOSIS — R7689 Other specified abnormal immunological findings in serum: Secondary | ICD-10-CM
